# Patient Record
Sex: Male | Born: 1965
Health system: Southern US, Community
[De-identification: ages and names within clinical notes are randomized; demographics above are authoritative.]

## PROBLEM LIST (undated history)

## (undated) DIAGNOSIS — F319 Bipolar disorder, unspecified: Secondary | ICD-10-CM

## (undated) DIAGNOSIS — Z87442 Personal history of urinary calculi: Secondary | ICD-10-CM

## (undated) DIAGNOSIS — R413 Other amnesia: Secondary | ICD-10-CM

## (undated) DIAGNOSIS — B9620 Unspecified Escherichia coli [E. coli] as the cause of diseases classified elsewhere: Secondary | ICD-10-CM

## (undated) DIAGNOSIS — M75121 Complete rotator cuff tear or rupture of right shoulder, not specified as traumatic: Secondary | ICD-10-CM

## (undated) DIAGNOSIS — G5600 Carpal tunnel syndrome, unspecified upper limb: Secondary | ICD-10-CM

## (undated) DIAGNOSIS — M1990 Unspecified osteoarthritis, unspecified site: Secondary | ICD-10-CM

## (undated) DIAGNOSIS — R5381 Other malaise: Secondary | ICD-10-CM

## (undated) DIAGNOSIS — S069XAA Unspecified intracranial injury with loss of consciousness status unknown, initial encounter: Secondary | ICD-10-CM

## (undated) DIAGNOSIS — M545 Low back pain: Secondary | ICD-10-CM

## (undated) DIAGNOSIS — J189 Pneumonia, unspecified organism: Secondary | ICD-10-CM

## (undated) DIAGNOSIS — R4020 Unspecified coma: Secondary | ICD-10-CM

## (undated) DIAGNOSIS — R5383 Other fatigue: Secondary | ICD-10-CM

## (undated) DIAGNOSIS — F519 Sleep disorder not due to a substance or known physiological condition, unspecified: Secondary | ICD-10-CM

## (undated) DIAGNOSIS — R011 Cardiac murmur, unspecified: Secondary | ICD-10-CM

## (undated) DIAGNOSIS — M86669 Other chronic osteomyelitis, unspecified tibia and fibula: Secondary | ICD-10-CM

## (undated) DIAGNOSIS — N390 Urinary tract infection, site not specified: Secondary | ICD-10-CM

## (undated) DIAGNOSIS — M7580 Other shoulder lesions, unspecified shoulder: Secondary | ICD-10-CM

## (undated) DIAGNOSIS — E291 Testicular hypofunction: Secondary | ICD-10-CM

## (undated) DIAGNOSIS — M869 Osteomyelitis, unspecified: Secondary | ICD-10-CM

## (undated) DIAGNOSIS — K5289 Other specified noninfective gastroenteritis and colitis: Secondary | ICD-10-CM

## (undated) DIAGNOSIS — E785 Hyperlipidemia, unspecified: Secondary | ICD-10-CM

## (undated) DIAGNOSIS — S069X9A Unspecified intracranial injury with loss of consciousness of unspecified duration, initial encounter: Secondary | ICD-10-CM

## (undated) DIAGNOSIS — I10 Essential (primary) hypertension: Secondary | ICD-10-CM

## (undated) DIAGNOSIS — Z550 Illiteracy and low-level literacy: Secondary | ICD-10-CM

## (undated) DIAGNOSIS — K219 Gastro-esophageal reflux disease without esophagitis: Secondary | ICD-10-CM

## (undated) DIAGNOSIS — F1021 Alcohol dependence, in remission: Secondary | ICD-10-CM

## (undated) DIAGNOSIS — M25469 Effusion, unspecified knee: Secondary | ICD-10-CM

## (undated) DIAGNOSIS — N529 Male erectile dysfunction, unspecified: Secondary | ICD-10-CM

## (undated) DIAGNOSIS — K589 Irritable bowel syndrome without diarrhea: Secondary | ICD-10-CM

## (undated) HISTORY — DX: Bipolar disorder, unspecified: F31.9

## (undated) HISTORY — DX: Urinary tract infection, site not specified: N39.0

## (undated) HISTORY — DX: Unspecified coma: R40.20

## (undated) HISTORY — PX: OTHER SURGICAL HISTORY: SHX169

## (undated) HISTORY — DX: Unspecified intracranial injury with loss of consciousness status unknown, initial encounter: S06.9XAA

## (undated) HISTORY — PX: HAND SURGERY: SHX662

## (undated) HISTORY — DX: Carpal tunnel syndrome, unspecified upper limb: G56.00

## (undated) HISTORY — DX: Other specified noninfective gastroenteritis and colitis: K52.89

## (undated) HISTORY — DX: Personal history of urinary calculi: Z87.442

## (undated) HISTORY — DX: Alcohol dependence, in remission: F10.21

## (undated) HISTORY — DX: Low back pain: M54.5

## (undated) HISTORY — DX: Other amnesia: R41.3

## (undated) HISTORY — DX: Illiteracy and low-level literacy: Z55.0

## (undated) HISTORY — DX: Testicular hypofunction: E29.1

## (undated) HISTORY — DX: Effusion, unspecified knee: M25.469

## (undated) HISTORY — DX: Irritable bowel syndrome without diarrhea: K58.9

## (undated) HISTORY — DX: Sleep disorder not due to a substance or known physiological condition, unspecified: F51.9

## (undated) HISTORY — DX: Male erectile dysfunction, unspecified: N52.9

## (undated) HISTORY — DX: Pneumonia, unspecified organism: J18.9

## (undated) HISTORY — DX: Unspecified Escherichia coli (E. coli) as the cause of diseases classified elsewhere: B96.20

## (undated) HISTORY — DX: Unspecified Escherichia coli (E. coli) as the cause of diseases classified elsewhere: N39.0

## (undated) HISTORY — DX: Unspecified intracranial injury with loss of consciousness of unspecified duration, initial encounter: S06.9X9A

## (undated) HISTORY — DX: Osteomyelitis, unspecified: M86.9

## (undated) HISTORY — DX: Other chronic osteomyelitis, unspecified tibia and fibula: M86.669

## (undated) HISTORY — DX: Unspecified osteoarthritis, unspecified site: M19.90

## (undated) HISTORY — DX: Hyperlipidemia, unspecified: E78.5

## (undated) HISTORY — DX: Essential (primary) hypertension: I10

## (undated) HISTORY — DX: Other malaise: R53.81

## (undated) HISTORY — DX: Other fatigue: R53.83

## (undated) HISTORY — DX: Gastro-esophageal reflux disease without esophagitis: K21.9

## (undated) HISTORY — PX: LEG AMPUTATION: SHX1105

---

## 1998-10-19 ENCOUNTER — Emergency Department (HOSPITAL_COMMUNITY): Admission: EM | Admit: 1998-10-19 | Discharge: 1998-10-19 | Payer: Self-pay | Admitting: Emergency Medicine

## 1998-10-19 ENCOUNTER — Encounter: Payer: Self-pay | Admitting: Emergency Medicine

## 1998-10-27 ENCOUNTER — Ambulatory Visit (HOSPITAL_COMMUNITY): Admission: RE | Admit: 1998-10-27 | Discharge: 1998-10-27 | Payer: Self-pay | Admitting: Family Medicine

## 1998-10-27 ENCOUNTER — Encounter: Payer: Self-pay | Admitting: Family Medicine

## 1999-03-25 ENCOUNTER — Encounter: Payer: Self-pay | Admitting: Emergency Medicine

## 1999-03-25 ENCOUNTER — Emergency Department (HOSPITAL_COMMUNITY): Admission: EM | Admit: 1999-03-25 | Discharge: 1999-03-25 | Payer: Self-pay | Admitting: Emergency Medicine

## 1999-03-31 ENCOUNTER — Encounter: Payer: Self-pay | Admitting: Urology

## 1999-03-31 ENCOUNTER — Ambulatory Visit (HOSPITAL_COMMUNITY): Admission: RE | Admit: 1999-03-31 | Discharge: 1999-03-31 | Payer: Self-pay | Admitting: Urology

## 1999-04-12 ENCOUNTER — Encounter: Payer: Self-pay | Admitting: Urology

## 1999-04-12 ENCOUNTER — Ambulatory Visit (HOSPITAL_COMMUNITY): Admission: RE | Admit: 1999-04-12 | Discharge: 1999-04-12 | Payer: Self-pay | Admitting: Urology

## 1999-04-15 ENCOUNTER — Ambulatory Visit (HOSPITAL_COMMUNITY): Admission: RE | Admit: 1999-04-15 | Discharge: 1999-04-15 | Payer: Self-pay | Admitting: Urology

## 1999-04-15 ENCOUNTER — Encounter: Payer: Self-pay | Admitting: Urology

## 1999-05-24 ENCOUNTER — Encounter: Payer: Self-pay | Admitting: Urology

## 1999-05-24 ENCOUNTER — Ambulatory Visit (HOSPITAL_COMMUNITY): Admission: RE | Admit: 1999-05-24 | Discharge: 1999-05-24 | Payer: Self-pay | Admitting: Urology

## 2000-05-09 ENCOUNTER — Encounter: Payer: Self-pay | Admitting: Endocrinology

## 2000-05-09 ENCOUNTER — Ambulatory Visit (HOSPITAL_COMMUNITY): Admission: RE | Admit: 2000-05-09 | Discharge: 2000-05-09 | Payer: Self-pay | Admitting: Endocrinology

## 2000-06-08 ENCOUNTER — Encounter: Payer: Self-pay | Admitting: Urology

## 2000-06-08 ENCOUNTER — Encounter: Admission: RE | Admit: 2000-06-08 | Discharge: 2000-06-08 | Payer: Self-pay | Admitting: Urology

## 2000-06-11 ENCOUNTER — Emergency Department (HOSPITAL_COMMUNITY): Admission: EM | Admit: 2000-06-11 | Discharge: 2000-06-11 | Payer: Self-pay | Admitting: Emergency Medicine

## 2000-06-11 ENCOUNTER — Encounter: Payer: Self-pay | Admitting: Emergency Medicine

## 2000-12-03 ENCOUNTER — Encounter: Payer: Self-pay | Admitting: Internal Medicine

## 2000-12-03 ENCOUNTER — Emergency Department (HOSPITAL_COMMUNITY): Admission: EM | Admit: 2000-12-03 | Discharge: 2000-12-03 | Payer: Self-pay | Admitting: Internal Medicine

## 2003-10-25 HISTORY — PX: OTHER SURGICAL HISTORY: SHX169

## 2004-09-13 ENCOUNTER — Ambulatory Visit: Payer: Self-pay | Admitting: Endocrinology

## 2004-09-20 ENCOUNTER — Ambulatory Visit: Payer: Self-pay | Admitting: Internal Medicine

## 2004-11-29 ENCOUNTER — Ambulatory Visit: Payer: Self-pay | Admitting: Endocrinology

## 2004-11-30 ENCOUNTER — Ambulatory Visit: Payer: Self-pay

## 2005-01-06 ENCOUNTER — Encounter: Admission: RE | Admit: 2005-01-06 | Discharge: 2005-01-06 | Payer: Self-pay

## 2005-02-16 ENCOUNTER — Ambulatory Visit: Payer: Self-pay | Admitting: Infectious Diseases

## 2005-03-31 ENCOUNTER — Ambulatory Visit: Payer: Self-pay | Admitting: Infectious Diseases

## 2005-10-12 ENCOUNTER — Ambulatory Visit: Payer: Self-pay | Admitting: Internal Medicine

## 2005-10-18 ENCOUNTER — Encounter: Admission: RE | Admit: 2005-10-18 | Discharge: 2005-10-18 | Payer: Self-pay | Admitting: Internal Medicine

## 2005-12-27 ENCOUNTER — Emergency Department (HOSPITAL_COMMUNITY): Admission: EM | Admit: 2005-12-27 | Discharge: 2005-12-28 | Payer: Self-pay | Admitting: Emergency Medicine

## 2006-01-06 ENCOUNTER — Ambulatory Visit: Payer: Self-pay | Admitting: Endocrinology

## 2006-07-21 ENCOUNTER — Ambulatory Visit: Payer: Self-pay | Admitting: Endocrinology

## 2006-08-04 ENCOUNTER — Ambulatory Visit: Payer: Self-pay | Admitting: Endocrinology

## 2006-08-18 ENCOUNTER — Ambulatory Visit: Payer: Self-pay | Admitting: Endocrinology

## 2006-09-21 ENCOUNTER — Ambulatory Visit: Payer: Self-pay | Admitting: Endocrinology

## 2006-09-28 ENCOUNTER — Encounter: Payer: Self-pay | Admitting: Endocrinology

## 2006-09-28 ENCOUNTER — Ambulatory Visit: Payer: Self-pay

## 2006-10-19 ENCOUNTER — Ambulatory Visit: Payer: Self-pay | Admitting: Gastroenterology

## 2007-09-03 ENCOUNTER — Encounter: Payer: Self-pay | Admitting: Endocrinology

## 2007-10-04 ENCOUNTER — Ambulatory Visit: Payer: Self-pay | Admitting: Internal Medicine

## 2007-10-04 DIAGNOSIS — M79609 Pain in unspecified limb: Secondary | ICD-10-CM | POA: Insufficient documentation

## 2007-10-04 DIAGNOSIS — E785 Hyperlipidemia, unspecified: Secondary | ICD-10-CM

## 2007-10-04 DIAGNOSIS — K589 Irritable bowel syndrome without diarrhea: Secondary | ICD-10-CM

## 2007-10-04 DIAGNOSIS — F519 Sleep disorder not due to a substance or known physiological condition, unspecified: Secondary | ICD-10-CM | POA: Insufficient documentation

## 2007-10-04 DIAGNOSIS — Z87442 Personal history of urinary calculi: Secondary | ICD-10-CM

## 2007-10-04 DIAGNOSIS — R5381 Other malaise: Secondary | ICD-10-CM

## 2007-10-04 DIAGNOSIS — K219 Gastro-esophageal reflux disease without esophagitis: Secondary | ICD-10-CM | POA: Insufficient documentation

## 2007-10-04 DIAGNOSIS — I10 Essential (primary) hypertension: Secondary | ICD-10-CM

## 2007-10-04 DIAGNOSIS — F1021 Alcohol dependence, in remission: Secondary | ICD-10-CM

## 2007-10-04 DIAGNOSIS — R5383 Other fatigue: Secondary | ICD-10-CM

## 2007-10-04 DIAGNOSIS — J309 Allergic rhinitis, unspecified: Secondary | ICD-10-CM | POA: Insufficient documentation

## 2007-10-04 DIAGNOSIS — F319 Bipolar disorder, unspecified: Secondary | ICD-10-CM | POA: Insufficient documentation

## 2007-10-04 HISTORY — DX: Essential (primary) hypertension: I10

## 2007-10-04 HISTORY — DX: Other fatigue: R53.83

## 2007-10-04 HISTORY — DX: Sleep disorder not due to a substance or known physiological condition, unspecified: F51.9

## 2007-10-04 HISTORY — DX: Alcohol dependence, in remission: F10.21

## 2007-10-04 HISTORY — DX: Gastro-esophageal reflux disease without esophagitis: K21.9

## 2007-10-04 HISTORY — DX: Personal history of urinary calculi: Z87.442

## 2007-10-04 HISTORY — DX: Irritable bowel syndrome, unspecified: K58.9

## 2007-10-04 HISTORY — DX: Hyperlipidemia, unspecified: E78.5

## 2007-10-04 HISTORY — DX: Other fatigue: R53.81

## 2007-10-04 HISTORY — DX: Bipolar disorder, unspecified: F31.9

## 2007-10-07 ENCOUNTER — Encounter: Payer: Self-pay | Admitting: Internal Medicine

## 2007-10-07 DIAGNOSIS — R7303 Prediabetes: Secondary | ICD-10-CM | POA: Insufficient documentation

## 2007-10-07 LAB — CONVERTED CEMR LAB
ALT: 37 units/L (ref 0–53)
Alkaline Phosphatase: 113 units/L (ref 39–117)
BUN: 13 mg/dL (ref 6–23)
Basophils Relative: 0.5 % (ref 0.0–1.0)
Calcium: 10.2 mg/dL (ref 8.4–10.5)
Cholesterol: 171 mg/dL (ref 0–200)
Eosinophils Absolute: 0.1 10*3/uL (ref 0.0–0.6)
GFR calc Af Amer: 139 mL/min
GFR calc non Af Amer: 115 mL/min
HDL: 30.4 mg/dL — ABNORMAL LOW (ref >39.0)
Lymphocytes Relative: 28.9 % (ref 12.0–46.0)
MCV: 82.3 fL (ref 78.0–100.0)
Monocytes Relative: 7.2 % (ref 3.0–11.0)
Neutro Abs: 6.4 10*3/uL (ref 1.4–7.7)
Platelets: 394 10*3/uL (ref 150–400)
RBC: 5.23 M/uL (ref 4.22–5.81)
TSH: 1.18 microintl units/mL (ref 0.35–5.50)
Triglycerides: 283 mg/dL (ref 0–149)
VLDL: 57 mg/dL — ABNORMAL HIGH (ref 0–40)

## 2007-12-17 ENCOUNTER — Ambulatory Visit: Payer: Self-pay | Admitting: Endocrinology

## 2007-12-17 DIAGNOSIS — K5289 Other specified noninfective gastroenteritis and colitis: Secondary | ICD-10-CM

## 2007-12-17 HISTORY — DX: Other specified noninfective gastroenteritis and colitis: K52.89

## 2008-01-02 ENCOUNTER — Ambulatory Visit: Payer: Self-pay | Admitting: Endocrinology

## 2008-01-02 DIAGNOSIS — R413 Other amnesia: Secondary | ICD-10-CM | POA: Insufficient documentation

## 2008-01-03 LAB — CONVERTED CEMR LAB
Folate: 9.5 ng/mL
Vitamin B-12: 314 pg/mL (ref 211–911)

## 2008-01-04 ENCOUNTER — Telehealth (INDEPENDENT_AMBULATORY_CARE_PROVIDER_SITE_OTHER): Payer: Self-pay | Admitting: *Deleted

## 2008-01-18 ENCOUNTER — Encounter: Payer: Self-pay | Admitting: Endocrinology

## 2008-01-31 ENCOUNTER — Encounter
Admission: RE | Admit: 2008-01-31 | Discharge: 2008-04-30 | Payer: Self-pay | Admitting: Physical Medicine and Rehabilitation

## 2008-02-06 ENCOUNTER — Encounter: Payer: Self-pay | Admitting: Endocrinology

## 2008-02-06 ENCOUNTER — Ambulatory Visit: Payer: Self-pay | Admitting: Physical Medicine and Rehabilitation

## 2008-02-06 ENCOUNTER — Ambulatory Visit (HOSPITAL_COMMUNITY)
Admission: RE | Admit: 2008-02-06 | Discharge: 2008-02-06 | Payer: Self-pay | Admitting: Physical Medicine and Rehabilitation

## 2008-02-11 ENCOUNTER — Telehealth: Payer: Self-pay | Admitting: Endocrinology

## 2008-02-14 ENCOUNTER — Ambulatory Visit: Payer: Self-pay | Admitting: Endocrinology

## 2008-03-05 ENCOUNTER — Ambulatory Visit: Payer: Self-pay | Admitting: Physical Medicine and Rehabilitation

## 2008-03-13 ENCOUNTER — Telehealth (INDEPENDENT_AMBULATORY_CARE_PROVIDER_SITE_OTHER): Payer: Self-pay | Admitting: *Deleted

## 2008-03-13 DIAGNOSIS — M86669 Other chronic osteomyelitis, unspecified tibia and fibula: Secondary | ICD-10-CM | POA: Insufficient documentation

## 2008-03-13 HISTORY — DX: Other chronic osteomyelitis, unspecified tibia and fibula: M86.669

## 2008-03-21 ENCOUNTER — Encounter
Admission: RE | Admit: 2008-03-21 | Discharge: 2008-06-19 | Payer: Self-pay | Admitting: Physical Medicine and Rehabilitation

## 2008-03-24 ENCOUNTER — Ambulatory Visit: Payer: Self-pay | Admitting: Physical Medicine and Rehabilitation

## 2008-03-31 ENCOUNTER — Encounter: Admission: RE | Admit: 2008-03-31 | Discharge: 2008-04-15 | Payer: Self-pay | Admitting: *Deleted

## 2008-04-01 ENCOUNTER — Ambulatory Visit: Payer: Self-pay | Admitting: Psychiatry

## 2008-05-16 ENCOUNTER — Encounter
Admission: RE | Admit: 2008-05-16 | Discharge: 2008-05-19 | Payer: Self-pay | Admitting: Physical Medicine and Rehabilitation

## 2008-05-19 ENCOUNTER — Ambulatory Visit: Payer: Self-pay | Admitting: Physical Medicine and Rehabilitation

## 2008-05-19 ENCOUNTER — Telehealth (INDEPENDENT_AMBULATORY_CARE_PROVIDER_SITE_OTHER): Payer: Self-pay | Admitting: *Deleted

## 2008-05-20 ENCOUNTER — Ambulatory Visit: Payer: Self-pay | Admitting: Internal Medicine

## 2008-05-20 DIAGNOSIS — L989 Disorder of the skin and subcutaneous tissue, unspecified: Secondary | ICD-10-CM | POA: Insufficient documentation

## 2008-05-26 ENCOUNTER — Telehealth (INDEPENDENT_AMBULATORY_CARE_PROVIDER_SITE_OTHER): Payer: Self-pay | Admitting: *Deleted

## 2008-05-27 ENCOUNTER — Telehealth (INDEPENDENT_AMBULATORY_CARE_PROVIDER_SITE_OTHER): Payer: Self-pay | Admitting: *Deleted

## 2008-05-27 ENCOUNTER — Ambulatory Visit: Payer: Self-pay | Admitting: Endocrinology

## 2008-05-27 DIAGNOSIS — N529 Male erectile dysfunction, unspecified: Secondary | ICD-10-CM

## 2008-05-27 DIAGNOSIS — E291 Testicular hypofunction: Secondary | ICD-10-CM | POA: Insufficient documentation

## 2008-05-27 HISTORY — DX: Testicular hypofunction: E29.1

## 2008-05-27 HISTORY — DX: Male erectile dysfunction, unspecified: N52.9

## 2008-05-27 LAB — CONVERTED CEMR LAB: Testosterone: 135.05 ng/dL — ABNORMAL LOW (ref 350.00–890)

## 2008-05-29 ENCOUNTER — Telehealth (INDEPENDENT_AMBULATORY_CARE_PROVIDER_SITE_OTHER): Payer: Self-pay | Admitting: *Deleted

## 2008-06-04 ENCOUNTER — Telehealth (INDEPENDENT_AMBULATORY_CARE_PROVIDER_SITE_OTHER): Payer: Self-pay | Admitting: *Deleted

## 2008-06-06 ENCOUNTER — Telehealth (INDEPENDENT_AMBULATORY_CARE_PROVIDER_SITE_OTHER): Payer: Self-pay | Admitting: *Deleted

## 2008-06-06 ENCOUNTER — Ambulatory Visit: Payer: Self-pay | Admitting: Endocrinology

## 2008-06-06 LAB — CONVERTED CEMR LAB: Prolactin: 6 ng/mL

## 2008-06-09 ENCOUNTER — Ambulatory Visit: Payer: Self-pay | Admitting: Internal Medicine

## 2008-06-09 DIAGNOSIS — M25469 Effusion, unspecified knee: Secondary | ICD-10-CM

## 2008-06-09 HISTORY — DX: Effusion, unspecified knee: M25.469

## 2008-08-07 ENCOUNTER — Ambulatory Visit: Payer: Self-pay | Admitting: Infectious Diseases

## 2008-09-12 ENCOUNTER — Ambulatory Visit: Payer: Self-pay | Admitting: Endocrinology

## 2008-09-12 DIAGNOSIS — H538 Other visual disturbances: Secondary | ICD-10-CM | POA: Insufficient documentation

## 2008-10-01 ENCOUNTER — Encounter (INDEPENDENT_AMBULATORY_CARE_PROVIDER_SITE_OTHER): Payer: Self-pay | Admitting: *Deleted

## 2008-10-29 ENCOUNTER — Encounter: Payer: Self-pay | Admitting: Endocrinology

## 2008-11-11 ENCOUNTER — Encounter: Admission: RE | Admit: 2008-11-11 | Discharge: 2008-11-11 | Payer: Self-pay | Admitting: Neurology

## 2008-11-21 ENCOUNTER — Encounter: Payer: Self-pay | Admitting: Endocrinology

## 2008-11-26 ENCOUNTER — Telehealth: Payer: Self-pay | Admitting: Endocrinology

## 2008-12-12 ENCOUNTER — Ambulatory Visit: Payer: Self-pay | Admitting: Endocrinology

## 2008-12-13 LAB — CONVERTED CEMR LAB
AST: 23 units/L (ref 0–37)
Alkaline Phosphatase: 83 units/L (ref 39–117)
BUN: 15 mg/dL (ref 6–23)
Basophils Absolute: 0.1 10*3/uL (ref 0.0–0.1)
Bilirubin, Direct: 0.1 mg/dL (ref 0.0–0.3)
Chloride: 102 meq/L (ref 96–112)
Eosinophils Absolute: 0.2 10*3/uL (ref 0.0–0.7)
Eosinophils Relative: 2.2 % (ref 0.0–5.0)
GFR calc non Af Amer: 133 mL/min
Hemoglobin, Urine: NEGATIVE
LDL Cholesterol: 103 mg/dL — ABNORMAL HIGH (ref 0–99)
MCV: 82.7 fL (ref 78.0–100.0)
Neutrophils Relative %: 40.3 % — ABNORMAL LOW (ref 43.0–77.0)
Platelets: 261 10*3/uL (ref 150–400)
Potassium: 3.9 meq/L (ref 3.5–5.1)
Total Bilirubin: 0.6 mg/dL (ref 0.3–1.2)
Total CHOL/HDL Ratio: 4.6
Urine Glucose: NEGATIVE mg/dL
Urobilinogen, UA: 0.2 (ref 0.0–1.0)
VLDL: 30 mg/dL (ref 0–40)
WBC: 8.4 10*3/uL (ref 4.5–10.5)

## 2008-12-16 ENCOUNTER — Ambulatory Visit: Payer: Self-pay | Admitting: Internal Medicine

## 2008-12-23 ENCOUNTER — Telehealth (INDEPENDENT_AMBULATORY_CARE_PROVIDER_SITE_OTHER): Payer: Self-pay | Admitting: *Deleted

## 2009-03-11 ENCOUNTER — Encounter: Payer: Self-pay | Admitting: Endocrinology

## 2009-09-10 ENCOUNTER — Telehealth (INDEPENDENT_AMBULATORY_CARE_PROVIDER_SITE_OTHER): Payer: Self-pay | Admitting: *Deleted

## 2009-10-29 ENCOUNTER — Telehealth: Payer: Self-pay | Admitting: Endocrinology

## 2009-10-29 ENCOUNTER — Ambulatory Visit: Payer: Self-pay | Admitting: Endocrinology

## 2009-10-29 DIAGNOSIS — M545 Low back pain, unspecified: Secondary | ICD-10-CM | POA: Insufficient documentation

## 2009-10-29 HISTORY — DX: Low back pain, unspecified: M54.50

## 2009-10-29 LAB — CONVERTED CEMR LAB
AST: 33 units/L (ref 0–37)
Albumin: 3.9 g/dL (ref 3.5–5.2)
BUN: 10 mg/dL (ref 6–23)
Basophils Absolute: 0.1 10*3/uL (ref 0.0–0.1)
Bilirubin Urine: NEGATIVE
CO2: 31 meq/L (ref 19–32)
Calcium: 9.6 mg/dL (ref 8.4–10.5)
Cholesterol: 188 mg/dL (ref 0–200)
Creatinine,U: 171.7 mg/dL
Eosinophils Absolute: 0 10*3/uL (ref 0.0–0.7)
GFR calc non Af Amer: 113.22 mL/min (ref >60)
Glucose, Bld: 87 mg/dL (ref 70–99)
Glucose, Urine, Semiquant: NEGATIVE
HDL: 39.3 mg/dL (ref >39.00)
Hemoglobin: 14.5 g/dL (ref 13.0–17.0)
Hgb A1c MFr Bld: 6.7 % — ABNORMAL HIGH (ref 4.6–6.5)
Lymphocytes Relative: 34.3 % (ref 12.0–46.0)
Microalb Creat Ratio: 16.3 mg/g (ref 0.0–30.0)
Microalb, Ur: 2.8 mg/dL — ABNORMAL HIGH (ref 0.0–1.9)
Monocytes Relative: 8.5 % (ref 3.0–12.0)
Neutro Abs: 4.3 10*3/uL (ref 1.4–7.7)
Neutrophils Relative %: 55.8 % (ref 43.0–77.0)
RBC: 5.33 M/uL (ref 4.22–5.81)
RDW: 13 % (ref 11.5–14.6)
TSH: 2.12 microintl units/mL (ref 0.35–5.50)
Total Protein: 8.2 g/dL (ref 6.0–8.3)
VLDL: 35.8 mg/dL (ref 0.0–40.0)
Valproic Acid Lvl: 67.1 ug/mL (ref 50.0–100.0)
pH: 5

## 2009-11-20 ENCOUNTER — Ambulatory Visit: Payer: Self-pay | Admitting: Endocrinology
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
## 2010-11-23 NOTE — Progress Notes (Signed)
Summary: Pharmacy clarification  Phone Note From Pharmacy   Caller: [facility] Summary of Call: pharmacy called to inform MD that RX from today's visit has no such dosage. please clarify. pharmacy request new e-script. Initial call taken by: Margaret Pyle, CMA,  October 29, 2009 3:24 PM  Follow-up for Phone Call        i resent from generic list Follow-up by: Minus Breeding MD,  October 29, 2009 3:41 PM
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
## 2010-12-02 ENCOUNTER — Other Ambulatory Visit: Payer: Self-pay | Admitting: Anesthesiology

## 2010-12-02 DIAGNOSIS — M545 Low back pain, unspecified: Secondary | ICD-10-CM

## 2010-12-08 ENCOUNTER — Ambulatory Visit
Admission: RE | Admit: 2010-12-08 | Discharge: 2010-12-08 | Disposition: A | Discharge status: Home / Self Care | Payer: Medicare HMO | Source: Ambulatory Visit | Attending: Anesthesiology | Admitting: Anesthesiology

## 2010-12-08 DIAGNOSIS — M545 Low back pain, unspecified: Secondary | ICD-10-CM

## 2011-02-01 ENCOUNTER — Other Ambulatory Visit: Payer: Self-pay | Admitting: Endocrinology

## 2011-02-02 ENCOUNTER — Other Ambulatory Visit: Payer: Self-pay | Admitting: Endocrinology

## 2011-02-24 ENCOUNTER — Ambulatory Visit (INDEPENDENT_AMBULATORY_CARE_PROVIDER_SITE_OTHER): Payer: Medicare HMO | Admitting: Endocrinology

## 2011-02-24 ENCOUNTER — Other Ambulatory Visit (INDEPENDENT_AMBULATORY_CARE_PROVIDER_SITE_OTHER): Payer: Medicare HMO | Admitting: Endocrinology

## 2011-02-24 ENCOUNTER — Other Ambulatory Visit (INDEPENDENT_AMBULATORY_CARE_PROVIDER_SITE_OTHER): Payer: Medicare HMO

## 2011-02-24 ENCOUNTER — Encounter: Payer: Self-pay | Admitting: Endocrinology

## 2011-02-24 DIAGNOSIS — Z79899 Other long term (current) drug therapy: Secondary | ICD-10-CM

## 2011-02-24 DIAGNOSIS — I10 Essential (primary) hypertension: Secondary | ICD-10-CM

## 2011-02-24 DIAGNOSIS — E785 Hyperlipidemia, unspecified: Secondary | ICD-10-CM

## 2011-02-24 DIAGNOSIS — Z87442 Personal history of urinary calculi: Secondary | ICD-10-CM

## 2011-02-24 DIAGNOSIS — E291 Testicular hypofunction: Secondary | ICD-10-CM

## 2011-02-24 DIAGNOSIS — E119 Type 2 diabetes mellitus without complications: Secondary | ICD-10-CM

## 2011-02-24 LAB — CBC WITH DIFFERENTIAL/PLATELET
Basophils Relative: 0.5 % (ref 0.0–3.0)
Eosinophils Absolute: 0.1 10*3/uL (ref 0.0–0.7)
Eosinophils Relative: 0.7 % (ref 0.0–5.0)
HCT: 42.4 % (ref 39.0–52.0)
Lymphs Abs: 3.8 10*3/uL (ref 0.7–4.0)
MCHC: 34.4 g/dL (ref 30.0–36.0)
MCV: 83.6 fl (ref 78.0–100.0)
Monocytes Absolute: 0.9 10*3/uL (ref 0.1–1.0)
Neutrophils Relative %: 54.7 % (ref 43.0–77.0)
Platelets: 295 10*3/uL (ref 150.0–400.0)
WBC: 10.7 10*3/uL — ABNORMAL HIGH (ref 4.5–10.5)

## 2011-02-24 LAB — BASIC METABOLIC PANEL
BUN: 15 mg/dL (ref 6–23)
Chloride: 96 mEq/L (ref 96–112)
GFR: 109.33 mL/min (ref >60.00)
Potassium: 4.7 mEq/L (ref 3.5–5.1)
Sodium: 136 mEq/L (ref 135–145)

## 2011-02-24 LAB — LIPID PANEL
HDL: 42.2 mg/dL (ref >39.00)
Total CHOL/HDL Ratio: 4
Triglycerides: 328 mg/dL — ABNORMAL HIGH (ref 0.0–149.0)

## 2011-02-24 LAB — TESTOSTERONE: Testosterone: 66.49 ng/dL — ABNORMAL LOW (ref 350.00–890.00)

## 2011-02-24 LAB — URINALYSIS, ROUTINE W REFLEX MICROSCOPIC
Ketones, ur: 15
Specific Gravity, Urine: >=1.03 (ref 1.000–1.030)
Total Protein, Urine: NEGATIVE
Urine Glucose: NEGATIVE
pH: 6 (ref 5.0–8.0)

## 2011-02-24 LAB — URIC ACID: Uric Acid, Serum: 7 mg/dL (ref 4.0–7.8)

## 2011-02-24 LAB — HEPATIC FUNCTION PANEL
ALT: 39 U/L (ref 0–53)
AST: 44 U/L — ABNORMAL HIGH (ref 0–37)
Bilirubin, Direct: 0 mg/dL (ref 0.0–0.3)
Total Bilirubin: 0.4 mg/dL (ref 0.3–1.2)

## 2011-02-24 LAB — MICROALBUMIN / CREATININE URINE RATIO: Microalb, Ur: 5.2 mg/dL — ABNORMAL HIGH (ref 0.0–1.9)

## 2011-02-24 NOTE — Patient Instructions (Addendum)
blood tests are being ordered for you today.  please call [phone] to hear your test results.  You will be prompted to enter the 9-digit "MRN" number that appears at the top left of this page, followed by #.  Then you will hear the message. Please schedule a regular physical. (update: i left message on phone-tree:  We'll recheck lft upon ret.  i advised metformin.  Do you want refill of clomid?)
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
## 2011-02-25 LAB — LDL CHOLESTEROL, DIRECT: Direct LDL: 123.5 mg/dL

## 2011-03-04 ENCOUNTER — Other Ambulatory Visit: Payer: Self-pay | Admitting: Endocrinology
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
## 2011-03-27 ENCOUNTER — Other Ambulatory Visit: Payer: Self-pay | Admitting: Endocrinology

## 2011-06-01 ENCOUNTER — Other Ambulatory Visit: Payer: Self-pay | Admitting: Endocrinology

## 2011-09-09 ENCOUNTER — Ambulatory Visit: Payer: Medicare HMO | Admitting: Endocrinology

## 2011-09-09 ENCOUNTER — Encounter: Payer: Self-pay | Admitting: Endocrinology

## 2011-09-09 ENCOUNTER — Ambulatory Visit (INDEPENDENT_AMBULATORY_CARE_PROVIDER_SITE_OTHER): Payer: Medicare HMO | Admitting: Endocrinology

## 2011-09-09 DIAGNOSIS — E291 Testicular hypofunction: Secondary | ICD-10-CM

## 2011-09-09 MED ORDER — CLOMIPHENE CITRATE 50 MG PO TABS: ORAL_TABLET | ORAL | Status: DC

## 2011-09-09 MED ORDER — FLUCONAZOLE 150 MG PO TABS: 150.0000 mg | ORAL_TABLET | Freq: Every day | ORAL | Status: AC

## 2011-09-09 NOTE — Patient Instructions (Addendum)
Please schedule a regular physical in 1 month  i have sent a prescription to your pharmacy, for "comiphine" (pill to raise the testosterone).   When you come back, we can recheck the liver and blood sugar.  i have also sent a prescription to your pharmacy, for a pill to stop the penis symptoms.  You should not take the simvastatin while on this, and you should take only 1/2 as much diazepam for thee 3 days.
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
## 2011-09-20 ENCOUNTER — Telehealth: Payer: Self-pay

## 2011-09-20 NOTE — Telephone Encounter (Signed)
Pt informed of MD's advisement. 

## 2011-09-20 NOTE — Telephone Encounter (Signed)
Pt called stating Diflucan helped with pain and irritation to penis and "kidney". Pt is requesting MD advisement, okay to refill medications as sxs reoccur?

## 2011-09-20 NOTE — Telephone Encounter (Signed)
The rx has 2 refills

## 2011-09-21 ENCOUNTER — Other Ambulatory Visit: Payer: Self-pay | Admitting: Endocrinology

## 2011-09-24 ENCOUNTER — Other Ambulatory Visit: Payer: Self-pay | Admitting: Endocrinology

## 2011-10-11 ENCOUNTER — Other Ambulatory Visit: Payer: Medicare HMO

## 2011-10-11 ENCOUNTER — Ambulatory Visit (INDEPENDENT_AMBULATORY_CARE_PROVIDER_SITE_OTHER): Payer: Medicare HMO | Admitting: Endocrinology

## 2011-10-11 ENCOUNTER — Other Ambulatory Visit (INDEPENDENT_AMBULATORY_CARE_PROVIDER_SITE_OTHER): Payer: Medicare HMO

## 2011-10-11 ENCOUNTER — Encounter: Payer: Self-pay | Admitting: Endocrinology

## 2011-10-11 DIAGNOSIS — E119 Type 2 diabetes mellitus without complications: Secondary | ICD-10-CM

## 2011-10-11 DIAGNOSIS — F319 Bipolar disorder, unspecified: Secondary | ICD-10-CM

## 2011-10-11 DIAGNOSIS — E291 Testicular hypofunction: Secondary | ICD-10-CM

## 2011-10-11 DIAGNOSIS — K769 Liver disease, unspecified: Secondary | ICD-10-CM

## 2011-10-11 LAB — HEPATIC FUNCTION PANEL
Bilirubin, Direct: 0.1 mg/dL (ref 0.0–0.3)
Total Bilirubin: 0.3 mg/dL (ref 0.3–1.2)

## 2011-10-11 LAB — HEMOGLOBIN A1C: Hgb A1c MFr Bld: 8.5 % — ABNORMAL HIGH (ref 4.6–6.5)

## 2011-10-11 MED ORDER — METFORMIN HCL ER 500 MG PO TB24: ORAL_TABLET | ORAL | Status: DC

## 2011-10-11 NOTE — Patient Instructions (Addendum)
blood tests are being requested for you today.  please call [phone] to hear your test results.  You will be prompted to enter the 9-digit "MRN" number that appears at the top left of this page, followed by #.  Then you will hear the message.   Please return in 1 year. please consider these measures for your health:  minimize alcohol.  do not use tobacco products.  have a colonoscopy at least every 10 years from age 45.  keep firearms safely stored.  always use seat belts.  have working smoke alarms in your home.  see an eye doctor and dentist regularly.  never drive under the influence of alcohol or drugs (including prescription drugs).  those with fair skin should take precautions against the sun. (update: i left message on phone-tree:  i sent rx for metformin.  Please come back for a follow-up appointment in 3 months)
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
## 2011-10-12 LAB — HEPATITIS C ANTIBODY: HCV Ab: NEGATIVE

## 2011-10-12 LAB — HEPATITIS B SURFACE ANTIGEN: Hepatitis B Surface Ag: NEGATIVE

## 2011-12-05 ENCOUNTER — Telehealth: Payer: Self-pay

## 2011-12-05 NOTE — Telephone Encounter (Signed)
Pt states that he is willing to try to take 1/2 of what he is taking (2 tablets daily) and will callback if blurred vision persists.

## 2011-12-05 NOTE — Telephone Encounter (Signed)
ok 

## 2011-12-05 NOTE — Telephone Encounter (Signed)
Pt called stating that Metformin is causing blurred vision. Pt is requesting alternative medication, please advise.

## 2011-12-05 NOTE — Telephone Encounter (Signed)
This is a good and inexpensive medication.  Do you first want to try taking 1/2 as much?

## 2012-01-04 ENCOUNTER — Telehealth: Payer: Self-pay

## 2012-01-04 MED ORDER — LINAGLIPTIN 5 MG PO TABS: 5.0000 mg | ORAL_TABLET | Freq: Every day | ORAL | Status: DC

## 2012-01-04 NOTE — Telephone Encounter (Signed)
Pt called stating Metformin is causing blurred vision and twitching of upper extremities. Pt is requesting alternative medication, please advise.

## 2012-01-04 NOTE — Telephone Encounter (Signed)
Pt informed of new rx for DM.

## 2012-01-04 NOTE — Telephone Encounter (Signed)
i changed rx.  i have sent a prescription to your pharmacy.

## 2012-06-18 ENCOUNTER — Other Ambulatory Visit: Payer: Self-pay | Admitting: *Deleted

## 2012-06-18 MED ORDER — SIMVASTATIN 80 MG PO TABS: ORAL_TABLET | ORAL | Status: DC

## 2012-06-18 NOTE — Telephone Encounter (Signed)
R'cd fax from [facility] for refill of Simvastatin.  

## 2012-08-13 ENCOUNTER — Other Ambulatory Visit: Payer: Self-pay | Admitting: Endocrinology

## 2012-08-13 ENCOUNTER — Ambulatory Visit
Admission: RE | Admit: 2012-08-13 | Discharge: 2012-08-13 | Disposition: A | Discharge status: Home / Self Care | Payer: Medicare Other | Source: Ambulatory Visit | Attending: Endocrinology | Admitting: Endocrinology

## 2012-08-13 ENCOUNTER — Ambulatory Visit (INDEPENDENT_AMBULATORY_CARE_PROVIDER_SITE_OTHER): Payer: Medicare Other | Admitting: Endocrinology

## 2012-08-13 ENCOUNTER — Encounter: Payer: Self-pay | Admitting: Endocrinology

## 2012-08-13 VITALS — BP 122/76 | HR 74 | Temp 98.7°F | Resp 16 | Wt 223.5 lb

## 2012-08-13 DIAGNOSIS — R109 Unspecified abdominal pain: Secondary | ICD-10-CM

## 2012-08-13 MED ORDER — OMEPRAZOLE 40 MG PO CPDR: 40.0000 mg | DELAYED_RELEASE_CAPSULE | Freq: Every day | ORAL | Status: DC

## 2012-08-13 NOTE — Patient Instructions (Addendum)
Let's check an ultrasound of the area.  you will receive a phone call, about a day and time for an appointment.   Please come back for a regular physical in January.  I hope you feel better soon.  If you don't feel better soon, please call back.  Please call sooner if you get worse.   i have sent a prescription to your pharmacy, for the acid taste.
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
## 2012-08-30 ENCOUNTER — Other Ambulatory Visit: Payer: Self-pay | Admitting: *Deleted

## 2012-08-30 MED ORDER — LISINOPRIL-HYDROCHLOROTHIAZIDE 10-12.5 MG PO TABS: 1.0000 | ORAL_TABLET | Freq: Every day | ORAL | Status: DC

## 2012-08-30 NOTE — Telephone Encounter (Signed)
Medication refill request.

## 2012-09-24 ENCOUNTER — Encounter: Payer: Medicare HMO | Admitting: Endocrinology

## 2012-11-05 ENCOUNTER — Encounter: Payer: Self-pay | Admitting: Endocrinology

## 2012-11-05 ENCOUNTER — Ambulatory Visit (INDEPENDENT_AMBULATORY_CARE_PROVIDER_SITE_OTHER): Payer: Medicare Other | Admitting: Endocrinology

## 2012-11-05 VITALS — BP 124/80 | HR 71 | Wt 221.0 lb

## 2012-11-05 DIAGNOSIS — E119 Type 2 diabetes mellitus without complications: Secondary | ICD-10-CM

## 2012-11-05 DIAGNOSIS — I10 Essential (primary) hypertension: Secondary | ICD-10-CM

## 2012-11-05 DIAGNOSIS — E1059 Type 1 diabetes mellitus with other circulatory complications: Secondary | ICD-10-CM

## 2012-11-05 DIAGNOSIS — E291 Testicular hypofunction: Secondary | ICD-10-CM

## 2012-11-05 DIAGNOSIS — Z79899 Other long term (current) drug therapy: Secondary | ICD-10-CM

## 2012-11-05 DIAGNOSIS — E785 Hyperlipidemia, unspecified: Secondary | ICD-10-CM

## 2012-11-05 DIAGNOSIS — K769 Liver disease, unspecified: Secondary | ICD-10-CM

## 2012-11-05 LAB — URINALYSIS, ROUTINE W REFLEX MICROSCOPIC
Bilirubin Urine: NEGATIVE
Nitrite: NEGATIVE
Total Protein, Urine: NEGATIVE
pH: 6 (ref 5.0–8.0)

## 2012-11-05 LAB — CBC WITH DIFFERENTIAL/PLATELET
Eosinophils Relative: 0.4 % (ref 0.0–5.0)
HCT: 44.3 % (ref 39.0–52.0)
Hemoglobin: 14.7 g/dL (ref 13.0–17.0)
Lymphs Abs: 2 10*3/uL (ref 0.7–4.0)
Monocytes Relative: 6.3 % (ref 3.0–12.0)
Neutro Abs: 6 10*3/uL (ref 1.4–7.7)
RBC: 5.28 Mil/uL (ref 4.22–5.81)
WBC: 8.6 10*3/uL (ref 4.5–10.5)

## 2012-11-05 LAB — LIPID PANEL: Total CHOL/HDL Ratio: 4

## 2012-11-05 LAB — BASIC METABOLIC PANEL
BUN: 14 mg/dL (ref 6–23)
GFR: 110.01 mL/min (ref >60.00)
Glucose, Bld: 99 mg/dL (ref 70–99)
Potassium: 4.5 mEq/L (ref 3.5–5.1)

## 2012-11-05 LAB — HEPATIC FUNCTION PANEL
ALT: 25 U/L (ref 0–53)
AST: 22 U/L (ref 0–37)
Albumin: 4 g/dL (ref 3.5–5.2)
Total Bilirubin: 0.4 mg/dL (ref 0.3–1.2)

## 2012-11-05 LAB — TSH: TSH: 0.23 u[IU]/mL — ABNORMAL LOW (ref 0.35–5.50)

## 2012-11-05 NOTE — Patient Instructions (Addendum)
blood tests are being requested for you today.  We'll contact you with results. please consider these measures for your health:  minimize alcohol.  do not use tobacco products.  have a colonoscopy at least every 10 years from age 45.  keep firearms safely stored.  always use seat belts.  have working smoke alarms in your home.  see an eye doctor and dentist regularly.  never drive under the influence of alcohol or drugs (including prescription drugs).  those with fair skin should take precautions against the sun. Please come back for a follow-up appointment in 6 months.    
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
## 2012-11-28 ENCOUNTER — Other Ambulatory Visit: Payer: Self-pay

## 2012-11-29 ENCOUNTER — Other Ambulatory Visit: Payer: Self-pay | Admitting: Endocrinology

## 2012-12-08 ENCOUNTER — Other Ambulatory Visit: Payer: Self-pay

## 2012-12-13 NOTE — Addendum Note (Signed)
Addended by: Sharlyne Pacas on: 12/13/2012 01:14 PM   Modules accepted: Orders

## 2012-12-19 ENCOUNTER — Ambulatory Visit (INDEPENDENT_AMBULATORY_CARE_PROVIDER_SITE_OTHER): Payer: Medicare Other | Admitting: Endocrinology

## 2012-12-19 ENCOUNTER — Telehealth: Payer: Self-pay

## 2012-12-19 VITALS — BP 126/80 | HR 80

## 2012-12-19 DIAGNOSIS — J069 Acute upper respiratory infection, unspecified: Secondary | ICD-10-CM

## 2012-12-19 MED ORDER — CEFUROXIME AXETIL 250 MG PO TABS: 250.0000 mg | ORAL_TABLET | Freq: Two times a day (BID) | ORAL | Status: AC

## 2012-12-19 MED ORDER — BENZONATATE 200 MG PO CAPS: 200.0000 mg | ORAL_CAPSULE | Freq: Three times a day (TID) | ORAL | Status: DC | PRN

## 2012-12-19 MED ORDER — BENZONATATE 100 MG PO CAPS: 100.0000 mg | ORAL_CAPSULE | Freq: Three times a day (TID) | ORAL | Status: DC | PRN

## 2012-12-19 NOTE — Telephone Encounter (Signed)
Pt called ins will not pay for cough rx you prescribed, please change
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
## 2012-12-19 NOTE — Patient Instructions (Addendum)
i have sent 2 prescriptions to your pharmacy: for an antibiotic, and cough pill. I hope you feel better soon.  If you don't feel better by next week, please call back.  Please call sooner if you get worse.

## 2012-12-19 NOTE — Telephone Encounter (Signed)
It is a cheap generic prescription.  If 100 mg pill are cheaper at your pharmacy, i can change the rx.

## 2012-12-19 NOTE — Telephone Encounter (Signed)
Pt would like you to change the rx

## 2012-12-19 NOTE — Telephone Encounter (Signed)
i changed and sent rx 

## 2012-12-19 NOTE — Telephone Encounter (Signed)
Pt advised.

## 2012-12-21 ENCOUNTER — Telehealth: Payer: Self-pay | Admitting: *Deleted

## 2012-12-21 NOTE — Telephone Encounter (Signed)
PATIENT CALLED AS FYI THAT COUGH MEDICATION ORDERED FOR HIM IS NOT COVERED ON INSURANCE PER PHARMACY AND HAD TO USE OTC COUGH MEDICATION. PATIENT WAS UPSET ABOUT THIS BUT STATES IS FEELING BETTER AND COUGH IS GETTING BETTER WITH OTC MED.

## 2013-01-17 ENCOUNTER — Other Ambulatory Visit: Payer: Self-pay | Admitting: Endocrinology

## 2013-01-21 ENCOUNTER — Ambulatory Visit (INDEPENDENT_AMBULATORY_CARE_PROVIDER_SITE_OTHER): Payer: Medicare Other | Admitting: Endocrinology

## 2013-01-21 ENCOUNTER — Encounter: Payer: Self-pay | Admitting: Endocrinology

## 2013-01-21 ENCOUNTER — Telehealth: Payer: Self-pay | Admitting: Endocrinology

## 2013-01-21 VITALS — BP 126/74 | HR 78 | Wt 214.0 lb

## 2013-01-21 DIAGNOSIS — E119 Type 2 diabetes mellitus without complications: Secondary | ICD-10-CM

## 2013-01-21 DIAGNOSIS — E079 Disorder of thyroid, unspecified: Secondary | ICD-10-CM | POA: Insufficient documentation

## 2013-01-21 LAB — HEMOGLOBIN A1C: Hgb A1c MFr Bld: 5.7 % (ref 4.6–6.5)

## 2013-01-21 MED ORDER — DIPHENOXYLATE-ATROPINE 2.5-0.025 MG PO TABS: 1.0000 | ORAL_TABLET | Freq: Four times a day (QID) | ORAL | Status: DC | PRN

## 2013-01-21 NOTE — Telephone Encounter (Signed)
The patient called hoping to get something called in for stomach cramps.  He was offered and ov for today or tomorrow, but refused.

## 2013-01-21 NOTE — Telephone Encounter (Signed)
If you can't make it here, see urgent care closer to your home.

## 2013-01-21 NOTE — Telephone Encounter (Signed)
Pt's wife advised pt would have to be seen in order to get a rx, states an understanding
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
## 2013-01-21 NOTE — Telephone Encounter (Signed)
Pt called back stating he is having diarrhea and doesn't think he can make it to the office, he states he has had this since last Thursday, taking Immodium, and it is not helping, what to do?

## 2013-01-21 NOTE — Patient Instructions (Addendum)
i have sent a prescription to your pharmacy, to help the diarrhea. I hope you feel better soon.  If you don't feel better by next week, please call back.    Diarrhea Diarrhea is watery poop (stool). The most common cause of diarrhea is a germ. Other causes include:  Food poisoning.  A reaction to medicine. HOME CARE   Drink clear fluids. This can stop you from losing too much body fluid (dehydration).  Drink enough fluids to keep your pee (urine) clear or pale yellow.  Avoid solid foods and dairy products until you start to feel better. Then start eating bland foods, such as:  Bananas.  Rice.  Crackers.  Applesauce.  Dry toast.  Avoid spicy foods, caffeine, and alcohol.  Your doctor may give medicine to help with cramps and watery poop. Take this as told. Avoid these medicines if you have a fever or blood in your poop.  Take your medicine as told. Finish them even if you start to feel better. GET HELP RIGHT AWAY IF:   The watery poop lasts longer than 3 days.  You have a fever.  Your baby is older than 3 months with a rectal temperature of 100.5 F (38.1 C) or higher for more than 1 day.  There is blood in your poop.  You start to throw up (vomit).  You lose too much fluid. MAKE SURE YOU:   Understand these instructions.  Will watch your condition.  Will get help right away if you are not doing well or get worse. Document Released: 03/28/2008 Document Revised: 01/02/2012 Document Reviewed: 03/28/2008 [facility] Patient Information 2013 [place], [place].

## 2013-01-21 NOTE — Telephone Encounter (Signed)
i can't help without seeing patient, since i don't know what the cause is.

## 2013-02-15 ENCOUNTER — Other Ambulatory Visit: Payer: Self-pay | Admitting: Endocrinology

## 2013-02-15 ENCOUNTER — Other Ambulatory Visit: Payer: Self-pay | Admitting: *Deleted

## 2013-02-15 MED ORDER — SIMVASTATIN 80 MG PO TABS: 80.0000 mg | ORAL_TABLET | Freq: Every day | ORAL | Status: DC

## 2013-05-04 ENCOUNTER — Encounter: Payer: Self-pay | Admitting: *Deleted

## 2013-05-04 ENCOUNTER — Emergency Department (INDEPENDENT_AMBULATORY_CARE_PROVIDER_SITE_OTHER): Payer: Medicare Other

## 2013-05-04 ENCOUNTER — Emergency Department
Admission: EM | Admit: 2013-05-04 | Discharge: 2013-05-04 | Disposition: A | Discharge status: Home / Self Care | Payer: Medicare Other | Source: Home / Self Care

## 2013-05-04 DIAGNOSIS — S63502A Unspecified sprain of left wrist, initial encounter: Secondary | ICD-10-CM

## 2013-05-04 DIAGNOSIS — M25539 Pain in unspecified wrist: Secondary | ICD-10-CM

## 2013-05-04 DIAGNOSIS — S63509A Unspecified sprain of unspecified wrist, initial encounter: Secondary | ICD-10-CM
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
## 2013-05-28 ENCOUNTER — Ambulatory Visit: Payer: Medicare Other | Admitting: Endocrinology

## 2013-07-09 ENCOUNTER — Other Ambulatory Visit: Payer: Self-pay | Admitting: Endocrinology

## 2013-07-09 ENCOUNTER — Other Ambulatory Visit: Payer: Self-pay | Admitting: *Deleted

## 2013-07-09 MED ORDER — SIMVASTATIN 80 MG PO TABS: 80.0000 mg | ORAL_TABLET | Freq: Every day | ORAL | Status: DC

## 2013-07-28 ENCOUNTER — Other Ambulatory Visit: Payer: Self-pay | Admitting: Endocrinology

## 2013-08-29 ENCOUNTER — Other Ambulatory Visit: Payer: Self-pay

## 2013-10-18 ENCOUNTER — Other Ambulatory Visit: Payer: Self-pay | Admitting: Endocrinology

## 2013-10-30 ENCOUNTER — Other Ambulatory Visit: Payer: Self-pay | Admitting: Endocrinology

## 2013-11-25 ENCOUNTER — Other Ambulatory Visit: Payer: Self-pay | Admitting: Endocrinology

## 2013-12-30 ENCOUNTER — Ambulatory Visit (INDEPENDENT_AMBULATORY_CARE_PROVIDER_SITE_OTHER): Payer: Medicare Other | Admitting: Endocrinology

## 2013-12-30 ENCOUNTER — Encounter: Payer: Self-pay | Admitting: Endocrinology

## 2013-12-30 VITALS — BP 110/70 | HR 75 | Temp 98.3°F | Ht 68.0 in | Wt 230.0 lb

## 2013-12-30 DIAGNOSIS — E119 Type 2 diabetes mellitus without complications: Secondary | ICD-10-CM

## 2013-12-30 DIAGNOSIS — E079 Disorder of thyroid, unspecified: Secondary | ICD-10-CM

## 2013-12-30 DIAGNOSIS — K769 Liver disease, unspecified: Secondary | ICD-10-CM

## 2013-12-30 DIAGNOSIS — E785 Hyperlipidemia, unspecified: Secondary | ICD-10-CM

## 2013-12-30 DIAGNOSIS — G894 Chronic pain syndrome: Secondary | ICD-10-CM

## 2013-12-30 DIAGNOSIS — Z87442 Personal history of urinary calculi: Secondary | ICD-10-CM

## 2013-12-30 DIAGNOSIS — Z0000 Encounter for general adult medical examination without abnormal findings: Secondary | ICD-10-CM

## 2013-12-30 DIAGNOSIS — E291 Testicular hypofunction: Secondary | ICD-10-CM

## 2013-12-30 DIAGNOSIS — I10 Essential (primary) hypertension: Secondary | ICD-10-CM

## 2013-12-30 LAB — CBC WITH DIFFERENTIAL/PLATELET
BASOS ABS: 0 10*3/uL (ref 0.0–0.1)
Basophils Relative: 0.4 % (ref 0.0–3.0)
EOS PCT: 0.8 % (ref 0.0–5.0)
Eosinophils Absolute: 0.1 10*3/uL (ref 0.0–0.7)
HCT: 45.2 % (ref 39.0–52.0)
Hemoglobin: 15 g/dL (ref 13.0–17.0)
LYMPHS PCT: 26.5 % (ref 12.0–46.0)
Lymphs Abs: 2.2 10*3/uL (ref 0.7–4.0)
MCHC: 33.2 g/dL (ref 30.0–36.0)
MCV: 86.1 fl (ref 78.0–100.0)
MONOS PCT: 6.4 % (ref 3.0–12.0)
Monocytes Absolute: 0.5 10*3/uL (ref 0.1–1.0)
NEUTROS PCT: 65.9 % (ref 43.0–77.0)
Neutro Abs: 5.5 10*3/uL (ref 1.4–7.7)
PLATELETS: 328 10*3/uL (ref 150.0–400.0)
RBC: 5.26 Mil/uL (ref 4.22–5.81)
RDW: 13.5 % (ref 11.5–14.6)
WBC: 8.4 10*3/uL (ref 4.5–10.5)

## 2013-12-30 LAB — URINALYSIS, ROUTINE W REFLEX MICROSCOPIC
Bilirubin Urine: NEGATIVE
Hgb urine dipstick: NEGATIVE
Ketones, ur: NEGATIVE
LEUKOCYTES UA: NEGATIVE
NITRITE: NEGATIVE
SPECIFIC GRAVITY, URINE: 1.025 (ref 1.000–1.030)
Total Protein, Urine: NEGATIVE
UROBILINOGEN UA: 0.2 (ref 0.0–1.0)
Urine Glucose: NEGATIVE
pH: 6 (ref 5.0–8.0)

## 2013-12-30 LAB — MICROALBUMIN / CREATININE URINE RATIO
CREATININE, U: 204.2 mg/dL
Microalb Creat Ratio: 0.6 mg/g (ref 0.0–30.0)
Microalb, Ur: 1.3 mg/dL (ref 0.0–1.9)

## 2013-12-30 LAB — HEMOGLOBIN A1C: Hgb A1c MFr Bld: 5.7 % (ref 4.6–6.5)

## 2013-12-30 NOTE — Patient Instructions (Addendum)
blood tests are being requested for you today.  We'll contact you with results. please consider these measures for your health:  minimize alcohol.  do not use tobacco products.  have a colonoscopy at least every 10 years from age 48.  keep firearms safely stored.  always use seat belts.  have working smoke alarms in your home.  see an eye doctor and dentist regularly.  never drive under the influence of alcohol or drugs (including prescription drugs).  those with fair skin should take precautions against the sun. Please come back for a follow-up appointment in 6 months.    
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
## 2013-12-31 DIAGNOSIS — Z0001 Encounter for general adult medical examination with abnormal findings: Secondary | ICD-10-CM | POA: Insufficient documentation

## 2013-12-31 DIAGNOSIS — Z0000 Encounter for general adult medical examination without abnormal findings: Secondary | ICD-10-CM | POA: Insufficient documentation

## 2013-12-31 LAB — BASIC METABOLIC PANEL
BUN: 20 mg/dL (ref 6–23)
CALCIUM: 9.7 mg/dL (ref 8.4–10.5)
CO2: 28 mEq/L (ref 19–32)
Chloride: 104 mEq/L (ref 96–112)
Creatinine, Ser: 0.8 mg/dL (ref 0.4–1.5)
GFR: 104.93 mL/min (ref >60.00)
GLUCOSE: 94 mg/dL (ref 70–99)
POTASSIUM: 4.7 meq/L (ref 3.5–5.1)
SODIUM: 139 meq/L (ref 135–145)

## 2013-12-31 LAB — LIPID PANEL
Cholesterol: 198 mg/dL (ref 0–200)
HDL: 50.3 mg/dL (ref >39.00)
LDL CALC: 116 mg/dL — AB (ref 0–99)
Total CHOL/HDL Ratio: 4
Triglycerides: 161 mg/dL — ABNORMAL HIGH (ref 0.0–149.0)
VLDL: 32.2 mg/dL (ref 0.0–40.0)

## 2013-12-31 LAB — HEPATIC FUNCTION PANEL
ALBUMIN: 4.6 g/dL (ref 3.5–5.2)
ALT: 23 U/L (ref 0–53)
AST: 26 U/L (ref 0–37)
Alkaline Phosphatase: 89 U/L (ref 39–117)
Bilirubin, Direct: 0 mg/dL (ref 0.0–0.3)
Total Bilirubin: 0.5 mg/dL (ref 0.3–1.2)
Total Protein: 7.7 g/dL (ref 6.0–8.3)

## 2013-12-31 LAB — PTH, INTACT AND CALCIUM
CALCIUM: 9.6 mg/dL (ref 8.4–10.5)
PTH: 59.9 pg/mL (ref 14.0–72.0)

## 2013-12-31 LAB — TESTOSTERONE: TESTOSTERONE: 162.84 ng/dL — AB (ref 350.00–890.00)

## 2013-12-31 LAB — TSH: TSH: 1.11 u[IU]/mL (ref 0.35–5.50)

## 2014-01-08 ENCOUNTER — Other Ambulatory Visit: Payer: Self-pay | Admitting: Endocrinology

## 2014-01-16 ENCOUNTER — Other Ambulatory Visit: Payer: Self-pay | Admitting: Endocrinology

## 2014-01-16 ENCOUNTER — Ambulatory Visit (INDEPENDENT_AMBULATORY_CARE_PROVIDER_SITE_OTHER): Payer: Medicare Other | Admitting: Endocrinology

## 2014-01-16 ENCOUNTER — Telehealth: Payer: Self-pay | Admitting: Endocrinology

## 2014-01-16 ENCOUNTER — Encounter: Payer: Self-pay | Admitting: Endocrinology

## 2014-01-16 VITALS — BP 122/70 | HR 75 | Temp 97.6°F | Ht 68.0 in | Wt 230.0 lb

## 2014-01-16 DIAGNOSIS — R200 Anesthesia of skin: Secondary | ICD-10-CM | POA: Insufficient documentation

## 2014-01-16 DIAGNOSIS — R209 Unspecified disturbances of skin sensation: Secondary | ICD-10-CM

## 2014-01-16 NOTE — Telephone Encounter (Signed)
Release request sent to Medical Records.

## 2014-01-16 NOTE — Patient Instructions (Addendum)
i would be happy to request for you a test of the nerve endings of the hands. i'll request your paper record, to see if we have a copy of the old nerve-ending test.
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
## 2014-01-16 NOTE — Telephone Encounter (Signed)
paper chart please

## 2014-01-20 ENCOUNTER — Telehealth: Payer: Self-pay | Admitting: Endocrinology

## 2014-01-20 DIAGNOSIS — R200 Anesthesia of skin: Secondary | ICD-10-CM

## 2014-01-20 NOTE — Telephone Encounter (Signed)
Noted, pt called and was informed that we were still waiting on paper chart. Pt is concerned about waiting to have nerve test done. Please advise Thanks!

## 2014-01-20 NOTE — Telephone Encounter (Signed)
Noted, and pt informed,

## 2014-01-20 NOTE — Telephone Encounter (Signed)
Ok, i have ordered 

## 2014-01-20 NOTE — Telephone Encounter (Signed)
Pt calling stating that he was supposed to be set up for a nerve test on his hands. Please follow up

## 2014-01-22 ENCOUNTER — Telehealth: Payer: Self-pay | Admitting: Neurology

## 2014-01-22 ENCOUNTER — Telehealth: Payer: Self-pay

## 2014-01-22 NOTE — Telephone Encounter (Addendum)
Error

## 2014-01-22 NOTE — Telephone Encounter (Signed)
Patient wants someone to give him a call about what to expect in his upcoming appointment, states he thought it would be more than just an office visit, he thought that this was for a nerve test. Please call and advise patient. Patient is concerned about having to pay his copay for that visit and then having to pay another one if he needs to return for a test.

## 2014-01-23 ENCOUNTER — Other Ambulatory Visit: Payer: Self-pay | Admitting: Endocrinology

## 2014-01-23 NOTE — Telephone Encounter (Signed)
Spoke with patient and explained that the physician will determine what is needed during the assessment, and we would not have any idea what is needed before being seen.he verbalized understanding.

## 2014-01-28 ENCOUNTER — Encounter: Payer: Self-pay | Admitting: Neurology

## 2014-01-28 ENCOUNTER — Ambulatory Visit (INDEPENDENT_AMBULATORY_CARE_PROVIDER_SITE_OTHER): Payer: Medicare Other | Admitting: Neurology

## 2014-01-28 VITALS — BP 122/80 | HR 89 | Ht 69.0 in | Wt 231.0 lb

## 2014-01-28 DIAGNOSIS — R209 Unspecified disturbances of skin sensation: Secondary | ICD-10-CM

## 2014-01-28 DIAGNOSIS — R202 Paresthesia of skin: Secondary | ICD-10-CM

## 2014-01-28 MED ORDER — CYCLOBENZAPRINE HCL 7.5 MG PO TABS: 3.7500 mg | ORAL_TABLET | Freq: Three times a day (TID) | ORAL | Status: DC | PRN
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
## 2014-01-28 NOTE — Patient Instructions (Signed)
Overall you are doing fairly well but I do want to suggest a few things today:   Remember to drink plenty of fluid, eat healthy meals and do not skip any meals. Try to eat protein with a every meal and eat a healthy snack such as fruit or nuts in between meals. Try to keep a regular sleep-wake schedule and try to exercise daily, particularly in the form of walking, 20-30 minutes a day, if you can.   As far as your medications are concerned, I would like to suggest the following: 1)Please try Flexeril 3.75mg  (1/2 tab) as needed, up to 3 times a day for symptomatic relief  Please schedule an EMG/Nerve Conduction study to evaluate your hand numbness  Follow up once workup completed. Please call us with any interim questions, concerns, problems, updates or refill requests.   My clinical assistant and will answer any of your questions and relay your messages to me and also relay most of my messages to you.   Our phone number is [phone]. We also have an after hours call service for urgent matters and there is a physician on-call for urgent questions. For any emergencies you know to call 911 or go to the nearest emergency room

## 2014-02-04 ENCOUNTER — Other Ambulatory Visit: Payer: Self-pay | Admitting: Endocrinology

## 2014-02-18 ENCOUNTER — Telehealth: Payer: Self-pay | Admitting: Neurology

## 2014-02-18 NOTE — Telephone Encounter (Signed)
Pt called concerning the NCV/EMG test coming up, pt states the medicine has helped his hands but his neck is still hurting and just wants to know what these test coming up are going to help pt. Pt would like for Dr. Janann Colonel to call  back concerning this matter. Thanks

## 2014-02-19 ENCOUNTER — Other Ambulatory Visit: Payer: Self-pay | Admitting: Neurology

## 2014-02-19 MED ORDER — CYCLOBENZAPRINE HCL 7.5 MG PO TABS: 3.7500 mg | ORAL_TABLET | Freq: Three times a day (TID) | ORAL | Status: DC | PRN

## 2014-02-19 NOTE — Telephone Encounter (Signed)
Called patient and discussed upcoming test. As symptoms are improving and likely MSK in nature will cancel EMG/NCS at this time. Refilled his muscle relaxant.  Colletta [place], can you please cancel the upcmoEMG/NCS from Dr Joanna Hews schedule. Thanks.

## 2014-02-19 NOTE — Telephone Encounter (Signed)
Information was given to [place], [place].

## 2014-02-19 NOTE — Telephone Encounter (Signed)
Patient appointment was canceled.

## 2014-02-27 ENCOUNTER — Encounter: Payer: Medicare Other | Admitting: Radiology

## 2014-02-27 ENCOUNTER — Encounter: Payer: Medicare Other | Admitting: Neurology

## 2014-02-28 ENCOUNTER — Other Ambulatory Visit: Payer: Self-pay | Admitting: *Deleted

## 2014-02-28 ENCOUNTER — Other Ambulatory Visit: Payer: Self-pay | Admitting: Endocrinology

## 2014-02-28 MED ORDER — SIMVASTATIN 80 MG PO TABS: 80.0000 mg | ORAL_TABLET | Freq: Every day | ORAL | Status: DC

## 2014-03-14 ENCOUNTER — Other Ambulatory Visit: Payer: Self-pay | Admitting: Neurology

## 2014-03-14 ENCOUNTER — Telehealth: Payer: Self-pay | Admitting: Neurology

## 2014-03-14 MED ORDER — CYCLOBENZAPRINE HCL 7.5 MG PO TABS: 7.5000 mg | ORAL_TABLET | Freq: Three times a day (TID) | ORAL | Status: DC | PRN

## 2014-03-14 NOTE — Telephone Encounter (Signed)
Please let him know a prescription for 7.5mg  (1 whole tablet) was sent to his pharmacy. Thanks.

## 2014-03-14 NOTE — Telephone Encounter (Signed)
Patient still having problems with neck and shoulder down to back--needs refill called in for Cyclobenzaprine--needs a higher dose -[facility] in [facility] advise patient--thank you.

## 2014-03-14 NOTE — Telephone Encounter (Signed)
Patient indicates he is still having pain and would like a dose increase on Cyclobenzaprine.  Please advise.  Thank you.

## 2014-03-14 NOTE — Telephone Encounter (Signed)
I called the patient back.  He is aware Rx has been updated to one full tab daily and sent to the pharmacy.

## 2014-04-09 ENCOUNTER — Telehealth: Payer: Self-pay | Admitting: Neurology

## 2014-04-09 MED ORDER — CYCLOBENZAPRINE HCL 7.5 MG PO TABS: 7.5000 mg | ORAL_TABLET | Freq: Three times a day (TID) | ORAL | Status: DC | PRN

## 2014-04-09 NOTE — Telephone Encounter (Signed)
Rx has been sent  

## 2014-04-09 NOTE — Telephone Encounter (Signed)
Patient need refill of cyclobenzaprine (FEXMID) 7.5 MG tablet.

## 2014-04-30 ENCOUNTER — Telehealth: Payer: Self-pay | Admitting: Neurology

## 2014-04-30 NOTE — Telephone Encounter (Signed)
Patient is still having muscle spasms in his back--medication is not working well--patient states he needs a soon appointment--please call.

## 2014-05-01 ENCOUNTER — Other Ambulatory Visit: Payer: Self-pay | Admitting: Endocrinology

## 2014-05-01 NOTE — Telephone Encounter (Signed)
Offer follow up appt with Dr. Janann Colonel or NP in next 1-2 weeks. -VRP

## 2014-05-01 NOTE — Telephone Encounter (Signed)
Patient said that the Cyclobenzaprine-7.5mg  only helps with the spasms if he is sitting down and also his insurance will no pay for the medicine. Is there something else he can take?

## 2014-05-02 ENCOUNTER — Telehealth: Payer: Self-pay | Admitting: Diagnostic Neuroimaging

## 2014-05-02 NOTE — Telephone Encounter (Signed)
I do see several sedating medications on his list. I am reluctant to advise patient to take any other sedating medication. Please offer to patient that we can maybe move up his appointment with the nurse practitioner.

## 2014-05-02 NOTE — Telephone Encounter (Signed)
Called pt to inform him per Dr. Rexene Alberts that the doctor would like for the pt to come in sooner to see Hoyle Sauer, NP. Pt made an appt on 05/06/14 with Hoyle Sauer, NP. I advised the pt that if he has any other problems, questions or concerns to call the office. Pt verbalized understanding.

## 2014-05-02 NOTE — Telephone Encounter (Signed)
Patient stated his out of cyclobenzaprine (FEXMID) 7.5 MG tablet and next appointment is scheduled 7/20.  What should he do until then?  Please call and advise.  Thanks

## 2014-05-02 NOTE — Telephone Encounter (Signed)
The patient is inquiring about Cyclobenzaprine.  He has an appt on 07/20.  Per previous note, the patient says this med does not work for him unless he is sitting down, and states ins will not cover this drug.  Patient would like to know what he should do until he is seen.   I called ins who said the preferred drugs on his policy include: Tizanidine, Meloxicam, Diclofenac or Nabumetone.   Dr Janann Colonel is out of the office.  Forwarding request to [facility] for review.  Please advise.  Thank you.

## 2014-05-02 NOTE — Telephone Encounter (Signed)
Scheduled appt with NP(Martin), confirmed but patient requested a 3 week wait for appt.

## 2014-05-06 ENCOUNTER — Telehealth: Payer: Self-pay | Admitting: Neurology

## 2014-05-06 ENCOUNTER — Telehealth: Payer: Self-pay | Admitting: *Deleted

## 2014-05-06 ENCOUNTER — Encounter: Payer: Self-pay | Admitting: Nurse Practitioner

## 2014-05-06 ENCOUNTER — Ambulatory Visit (INDEPENDENT_AMBULATORY_CARE_PROVIDER_SITE_OTHER): Payer: Medicare Other | Admitting: Nurse Practitioner

## 2014-05-06 VITALS — BP 118/72 | HR 86 | Ht 69.0 in | Wt 228.2 lb

## 2014-05-06 DIAGNOSIS — R200 Anesthesia of skin: Secondary | ICD-10-CM

## 2014-05-06 DIAGNOSIS — R5383 Other fatigue: Secondary | ICD-10-CM

## 2014-05-06 DIAGNOSIS — R5381 Other malaise: Secondary | ICD-10-CM

## 2014-05-06 DIAGNOSIS — M542 Cervicalgia: Secondary | ICD-10-CM

## 2014-05-06 DIAGNOSIS — R209 Unspecified disturbances of skin sensation: Secondary | ICD-10-CM

## 2014-05-06 MED ORDER — CYCLOBENZAPRINE HCL 7.5 MG PO TABS: 7.5000 mg | ORAL_TABLET | Freq: Three times a day (TID) | ORAL | Status: DC | PRN

## 2014-05-06 NOTE — Telephone Encounter (Signed)
Returned his call, all questions were answered. He will continue to use flexeril as needed. Will start using the hot tub at his gym, work on stretching and relaxation exercises for his neck. We will hold off on an EMG/NCS at this time. He will call me in a few months to update me on his status.

## 2014-05-06 NOTE — Telephone Encounter (Signed)
Called and left VM message that patient may continue to f/u with Dr Sumner(per Dr Janann Colonel) and that the EMG has not been scheduled, per OV notes(05/06/14) that says it may be ordered if symptoms continue.

## 2014-05-06 NOTE — Telephone Encounter (Signed)
Please advise previous note. Thanks  °
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
## 2014-05-06 NOTE — Telephone Encounter (Signed)
William Chase at check out today states that he does not want to see the nurse practitioner anymore, he just wants to see Dr. Janann Colonel. William Chase also wants a return call to discuss whether the nerve conduction and EMG test is necessary, please return call to William Chase and advise.

## 2014-05-06 NOTE — Patient Instructions (Signed)
Patient to continue cyclobenzaprine as directed Patient is already on Valium Follow up in 6 months

## 2014-05-12 ENCOUNTER — Ambulatory Visit: Payer: Self-pay | Admitting: Nurse Practitioner

## 2014-05-29 NOTE — Telephone Encounter (Signed)
Noted  

## 2014-06-13 ENCOUNTER — Other Ambulatory Visit: Payer: Self-pay | Admitting: Endocrinology

## 2014-07-23 ENCOUNTER — Other Ambulatory Visit: Payer: Self-pay | Admitting: Endocrinology

## 2014-07-30 ENCOUNTER — Telehealth: Payer: Self-pay

## 2014-07-30 NOTE — Telephone Encounter (Signed)
Pt stated that he has not had an eye exam this year, but plans to schedule one before the end of this year.

## 2014-08-04 ENCOUNTER — Encounter: Payer: Self-pay | Admitting: Endocrinology

## 2014-08-04 ENCOUNTER — Ambulatory Visit: Payer: Medicare Other | Admitting: Endocrinology

## 2014-08-04 ENCOUNTER — Ambulatory Visit (INDEPENDENT_AMBULATORY_CARE_PROVIDER_SITE_OTHER): Payer: Medicare Other | Admitting: Endocrinology

## 2014-08-04 VITALS — BP 124/78 | HR 92 | Temp 97.9°F | Ht 69.0 in | Wt 223.0 lb

## 2014-08-04 DIAGNOSIS — R300 Dysuria: Secondary | ICD-10-CM

## 2014-08-04 DIAGNOSIS — E119 Type 2 diabetes mellitus without complications: Secondary | ICD-10-CM

## 2014-08-04 LAB — HEMOGLOBIN A1C
Hgb A1c MFr Bld: 5.7 % — ABNORMAL HIGH (ref <5.7)
Mean Plasma Glucose: 117 mg/dL — ABNORMAL HIGH (ref <117)

## 2014-08-04 MED ORDER — CIPROFLOXACIN HCL 500 MG PO TABS
500.0000 mg | ORAL_TABLET | Freq: Two times a day (BID) | ORAL | Status: DC
Start: 2014-08-04 — End: 2014-08-29
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
## 2014-08-04 NOTE — Patient Instructions (Addendum)
Blood and urine tests are requested for you today.  We'll contact you with results. i have sent a prescription to your pharmacy, for an antibiotic pill. I hope you feel better soon.  If you don't feel better by next week, please call back.  Please call sooner if you get worse.

## 2014-08-06 LAB — CULTURE, URINE COMPREHENSIVE: Colony Count: 30000

## 2014-08-22 ENCOUNTER — Other Ambulatory Visit: Payer: Self-pay | Admitting: Endocrinology

## 2014-08-28 ENCOUNTER — Other Ambulatory Visit: Payer: Self-pay | Admitting: Endocrinology

## 2014-08-28 MED ORDER — LISINOPRIL-HYDROCHLOROTHIAZIDE 10-12.5 MG PO TABS: ORAL_TABLET | ORAL | Status: DC

## 2014-08-28 NOTE — Telephone Encounter (Signed)
please call patient: Do you still have urine sxs? If so, please let us know, so i can request for you to see a urologist.

## 2014-08-28 NOTE — Telephone Encounter (Signed)
Cipro denied. Lisinopril refilled. Pt coming for appointment on 11/6 at 1115 am.

## 2014-08-28 NOTE — Telephone Encounter (Signed)
Contacted pt he states that he has not had any urine sxs. Pt stated he is just having the coughing sxs.

## 2014-08-28 NOTE — Telephone Encounter (Signed)
Ok, please decline the cipro, and please refill the lisinopril-HCT prn

## 2014-08-28 NOTE — Telephone Encounter (Signed)
Pt states that he needs the Cipro because he has been coughing up blood for 2 two days. Please advise, Thanks!

## 2014-08-28 NOTE — Telephone Encounter (Signed)
Please refill zestoretic and decline cipro

## 2014-08-29 ENCOUNTER — Ambulatory Visit: Payer: Medicare Other | Admitting: Endocrinology

## 2014-08-29 ENCOUNTER — Ambulatory Visit (INDEPENDENT_AMBULATORY_CARE_PROVIDER_SITE_OTHER): Payer: Medicare Other | Admitting: Endocrinology

## 2014-08-29 ENCOUNTER — Ambulatory Visit
Admission: RE | Admit: 2014-08-29 | Discharge: 2014-08-29 | Disposition: A | Discharge status: Home / Self Care | Payer: Medicare Other | Source: Ambulatory Visit | Attending: Endocrinology | Admitting: Endocrinology

## 2014-08-29 ENCOUNTER — Ambulatory Visit (INDEPENDENT_AMBULATORY_CARE_PROVIDER_SITE_OTHER): Payer: Medicare Other

## 2014-08-29 ENCOUNTER — Other Ambulatory Visit (INDEPENDENT_AMBULATORY_CARE_PROVIDER_SITE_OTHER): Payer: Medicare Other

## 2014-08-29 ENCOUNTER — Encounter: Payer: Self-pay | Admitting: Endocrinology

## 2014-08-29 VITALS — BP 160/90 | HR 90 | Temp 97.4°F | Ht 69.0 in | Wt 221.0 lb

## 2014-08-29 DIAGNOSIS — J189 Pneumonia, unspecified organism: Secondary | ICD-10-CM

## 2014-08-29 DIAGNOSIS — R042 Hemoptysis: Secondary | ICD-10-CM

## 2014-08-29 LAB — CBC WITH DIFFERENTIAL/PLATELET
BASOS ABS: 0 10*3/uL (ref 0.0–0.1)
BASOS PCT: 0.3 % (ref 0.0–3.0)
EOS PCT: 0.6 % (ref 0.0–5.0)
Eosinophils Absolute: 0.1 10*3/uL (ref 0.0–0.7)
HEMATOCRIT: 42.9 % (ref 39.0–52.0)
Hemoglobin: 14.4 g/dL (ref 13.0–17.0)
LYMPHS ABS: 2 10*3/uL (ref 0.7–4.0)
LYMPHS PCT: 16.5 % (ref 12.0–46.0)
MCHC: 33.5 g/dL (ref 30.0–36.0)
MCV: 84.4 fl (ref 78.0–100.0)
Monocytes Absolute: 0.8 10*3/uL (ref 0.1–1.0)
Monocytes Relative: 6.9 % (ref 3.0–12.0)
Neutro Abs: 9.3 10*3/uL — ABNORMAL HIGH (ref 1.4–7.7)
Neutrophils Relative %: 75.7 % (ref 43.0–77.0)
Platelets: 367 10*3/uL (ref 150.0–400.0)
RBC: 5.08 Mil/uL (ref 4.22–5.81)
RDW: 13.1 % (ref 11.5–15.5)
WBC: 12.2 10*3/uL — AB (ref 4.0–10.5)

## 2014-08-29 MED ORDER — CEFTRIAXONE SODIUM 1 G IJ SOLR: 1.0000 g | Freq: Once | INTRAMUSCULAR | Status: DC

## 2014-08-29 MED ORDER — CEFTRIAXONE SODIUM 1 G IJ SOLR
1.0000 g | Freq: Once | INTRAMUSCULAR | Status: AC
  Administered 2014-08-29: 1 g via INTRAMUSCULAR

## 2014-08-29 MED ORDER — CEFUROXIME AXETIL 500 MG PO TABS: 500.0000 mg | ORAL_TABLET | Freq: Two times a day (BID) | ORAL | Status: DC

## 2014-08-29 NOTE — Patient Instructions (Addendum)
blood tests are being requested for you today.  Please do at the old office.  We'll contact you with results. then please go to the first floor, for an antibiotic shot.   i have sent a prescription to your pharmacy, for an antibiotic pill.   Please come back for a follow-up appointment in 3 days.  Go to the ER if you get worse (especially shortness of breath, coughing up a lot of blood, or severe dizziness).  Until you feel better: rest, take tylenol, and drink plenty of fluids.

## 2014-08-29 NOTE — Progress Notes (Signed)
Pre visit review using our clinic review tool, if applicable. No additional management support is needed unless otherwise documented below in the visit note. 

## 2014-08-29 NOTE — Telephone Encounter (Signed)
Patient is over at [facility] lab to receive and injection (antibiotic) patient need a Dx code. Please advise
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
## 2014-08-29 NOTE — Telephone Encounter (Addendum)
Please advise dx code?

## 2014-08-29 NOTE — Telephone Encounter (Signed)
Please refill prn 

## 2014-09-03 ENCOUNTER — Ambulatory Visit (INDEPENDENT_AMBULATORY_CARE_PROVIDER_SITE_OTHER): Payer: Medicare Other | Admitting: Endocrinology

## 2014-09-03 ENCOUNTER — Encounter: Payer: Self-pay | Admitting: Endocrinology

## 2014-09-03 ENCOUNTER — Telehealth: Payer: Self-pay | Admitting: Nurse Practitioner

## 2014-09-03 VITALS — BP 136/88 | HR 92 | Temp 97.7°F | Ht 69.0 in | Wt 217.0 lb

## 2014-09-03 DIAGNOSIS — J189 Pneumonia, unspecified organism: Secondary | ICD-10-CM

## 2014-09-03 DIAGNOSIS — R200 Anesthesia of skin: Secondary | ICD-10-CM

## 2014-09-03 DIAGNOSIS — M542 Cervicalgia: Secondary | ICD-10-CM

## 2014-09-03 NOTE — Patient Instructions (Signed)
Please finish the antibiotic pills.   Please come back for a follow-up appointment in 1 month.   I hope you feel better soon.  If you don't continue feel better, please call back.  Please call sooner if you get worse.
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
## 2014-09-03 NOTE — Telephone Encounter (Signed)
Patient is calling. Patient continues to have muscle spasms which gives him headaches. Flexeril does not help. Please call patient and advise what he needs to do. Thank you.

## 2014-09-04 NOTE — Telephone Encounter (Signed)
Called patient to let them know that CM left early yesterday and will get this message today.

## 2014-09-04 NOTE — Telephone Encounter (Signed)
Per last visit note if Flexeril did not work would get EMG/ [place] study to see if there is a problem with his peripheral nerves  and muscles. Please let him know someone will call to schedule this

## 2014-09-04 NOTE — Telephone Encounter (Signed)
I called and notified the patient to be expecting a call to schedule the EMG/NCS.  Patient states that he has pneumonia right now.  Forwarding to EMG Dept.

## 2014-09-10 NOTE — Telephone Encounter (Signed)
Called dtr. Marisa Cyphers for scheduling dopplers.  Sf Olivia Mackie we do not schedule the NCV/EMG studies.  Thx. SF

## 2014-09-22 ENCOUNTER — Other Ambulatory Visit: Payer: Self-pay | Admitting: Endocrinology

## 2014-09-25 NOTE — Telephone Encounter (Signed)
I have sent two Staff messages to our scheduling and referral department to get this scheduled.

## 2014-09-26 NOTE — Telephone Encounter (Signed)
This test has been scheduled for this patient since 09/05/14 it is scheduled for 10/09/14 @ 1pm.

## 2014-10-03 ENCOUNTER — Ambulatory Visit: Payer: Medicare Other | Admitting: Endocrinology

## 2014-10-09 ENCOUNTER — Ambulatory Visit (INDEPENDENT_AMBULATORY_CARE_PROVIDER_SITE_OTHER): Payer: Medicare Other | Admitting: Diagnostic Neuroimaging

## 2014-10-09 ENCOUNTER — Encounter (INDEPENDENT_AMBULATORY_CARE_PROVIDER_SITE_OTHER): Payer: Self-pay | Admitting: Diagnostic Neuroimaging

## 2014-10-09 DIAGNOSIS — R200 Anesthesia of skin: Secondary | ICD-10-CM

## 2014-10-09 DIAGNOSIS — M542 Cervicalgia: Secondary | ICD-10-CM

## 2014-10-09 DIAGNOSIS — Z0289 Encounter for other administrative examinations: Secondary | ICD-10-CM

## 2014-10-09 NOTE — Procedures (Signed)
   [facility]  NCS (NERVE CONDUCTION STUDY) WITH EMG (ELECTROMYOGRAPHY) REPORT   STUDY DATE: 10/09/14  PATIENT NAME: William Chase DOB: 04/30/1966 MRN: [id number]  ORDERING CLINICIAN: Daun Peacock, NP  TECHNOLOGIST: Laretta Alstrom  ELECTROMYOGRAPHER: Earlean Polka. Penumalli, MD  CLINICAL INFORMATION: 48 year old male with neck pain.  FINDINGS: NERVE CONDUCTION STUDY: Bilateral median and bilateral ulnar motor responses and F wave latencies are normal. Bilateral median and bilateral ulnar sensory responses are normal.  NEEDLE ELECTROMYOGRAPHY: Right upper extremity deltoid, biceps, triceps, flexor carpi radialis, first dorsal interosseous and right C6-7 paraspinal muscles are normal.   IMPRESSION:  This is a normal study.    INTERPRETING PHYSICIAN:  Penni Bombard, MD Certified in Neurology, Neurophysiology and Neuroimaging  [facility] [address], [place], [place] [address] [phone]

## 2014-10-22 ENCOUNTER — Other Ambulatory Visit: Payer: Self-pay | Admitting: Endocrinology

## 2014-10-23 ENCOUNTER — Other Ambulatory Visit: Payer: Self-pay | Admitting: *Deleted

## 2014-10-23 MED ORDER — SIMVASTATIN 80 MG PO TABS: 80.0000 mg | ORAL_TABLET | Freq: Every day | ORAL | Status: DC

## 2014-10-30 ENCOUNTER — Telehealth: Payer: Self-pay | Admitting: Endocrinology

## 2014-10-30 NOTE — Telephone Encounter (Signed)
Pt coming in for office visit 10/31/2013 at 1115 am

## 2014-10-30 NOTE — Telephone Encounter (Signed)
i hear your situation.  However, for your safety, please see someone about your symptoms.

## 2014-10-30 NOTE — Telephone Encounter (Signed)
Pt states he has pneumonia again and is requesting we send in a rx for him. He does not have the money to come for a visit with [place]. Please advise.

## 2014-10-30 NOTE — Telephone Encounter (Signed)
See below and please advise, Thanks!  

## 2014-10-31 ENCOUNTER — Ambulatory Visit (INDEPENDENT_AMBULATORY_CARE_PROVIDER_SITE_OTHER)
Admission: RE | Admit: 2014-10-31 | Discharge: 2014-10-31 | Disposition: A | Discharge status: Home / Self Care | Payer: Medicare Other | Source: Ambulatory Visit | Attending: Endocrinology | Admitting: Endocrinology

## 2014-10-31 ENCOUNTER — Ambulatory Visit (INDEPENDENT_AMBULATORY_CARE_PROVIDER_SITE_OTHER): Payer: Medicare Other | Admitting: Endocrinology

## 2014-10-31 ENCOUNTER — Encounter: Payer: Self-pay | Admitting: Endocrinology

## 2014-10-31 VITALS — BP 134/82 | HR 85 | Temp 98.2°F | Ht 69.0 in

## 2014-10-31 DIAGNOSIS — J189 Pneumonia, unspecified organism: Secondary | ICD-10-CM

## 2014-10-31 DIAGNOSIS — J40 Bronchitis, not specified as acute or chronic: Secondary | ICD-10-CM | POA: Diagnosis not present

## 2014-10-31 MED ORDER — AMOXICILLIN-POT CLAVULANATE 875-125 MG PO TABS: 1.0000 | ORAL_TABLET | Freq: Two times a day (BID) | ORAL | Status: DC

## 2014-10-31 NOTE — Patient Instructions (Addendum)
i have sent a prescription to your pharmacy, for an antibiotic pill.  Please check a chest-x-ray, in the basement of the old office. I hope you feel better soon.  If you don't feel better by next week, please call back.  Please go to the emergency room sooner if you feel worse.
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
## 2014-11-03 ENCOUNTER — Telehealth: Payer: Self-pay | Admitting: Nurse Practitioner

## 2014-11-03 MED ORDER — CYCLOBENZAPRINE HCL 7.5 MG PO TABS: 7.5000 mg | ORAL_TABLET | Freq: Three times a day (TID) | ORAL | Status: DC | PRN

## 2014-11-03 NOTE — Telephone Encounter (Signed)
Patient requesting Rx refill for cyclobenzaprine (FEXMID) 7.5 MG tablet.  Patient's completely out of medication.  Please call and advise.

## 2014-11-03 NOTE — Telephone Encounter (Signed)
I called back.  He is aware.
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
## 2014-11-03 NOTE — Telephone Encounter (Signed)
He can go back to previous dose of flexeril.

## 2014-11-19 DIAGNOSIS — M79662 Pain in left lower leg: Secondary | ICD-10-CM | POA: Diagnosis not present

## 2014-11-19 DIAGNOSIS — Z79891 Long term (current) use of opiate analgesic: Secondary | ICD-10-CM | POA: Diagnosis not present

## 2014-11-19 DIAGNOSIS — M542 Cervicalgia: Secondary | ICD-10-CM | POA: Diagnosis not present

## 2014-11-19 DIAGNOSIS — G894 Chronic pain syndrome: Secondary | ICD-10-CM | POA: Diagnosis not present

## 2014-11-22 ENCOUNTER — Other Ambulatory Visit: Payer: Self-pay | Admitting: Endocrinology

## 2014-12-17 ENCOUNTER — Other Ambulatory Visit: Payer: Self-pay | Admitting: Endocrinology

## 2014-12-22 ENCOUNTER — Telehealth: Payer: Self-pay | Admitting: Endocrinology

## 2014-12-22 NOTE — Telephone Encounter (Signed)
See note below and please advise, Thanks! 

## 2014-12-22 NOTE — Telephone Encounter (Signed)
Pt advised, scheduled for 12/24/2014 at 215.

## 2014-12-22 NOTE — Telephone Encounter (Signed)
Patient

## 2014-12-22 NOTE — Telephone Encounter (Signed)
Patient called stating that he has the same chest cold He would like Dr. Loanne Drilling to please call in antibiotics     Please advise    Thank you

## 2014-12-22 NOTE — Telephone Encounter (Signed)
Please advise ov.  Please understand this is for your safety.

## 2014-12-24 ENCOUNTER — Encounter: Payer: Self-pay | Admitting: Endocrinology

## 2014-12-24 ENCOUNTER — Ambulatory Visit (INDEPENDENT_AMBULATORY_CARE_PROVIDER_SITE_OTHER): Payer: Medicare Other | Admitting: Endocrinology

## 2014-12-24 VITALS — BP 130/76 | HR 76 | Temp 98.0°F | Ht 69.0 in | Wt 209.0 lb

## 2014-12-24 DIAGNOSIS — J209 Acute bronchitis, unspecified: Secondary | ICD-10-CM

## 2014-12-24 MED ORDER — FLUTICASONE-SALMETEROL 100-50 MCG/DOSE IN AEPB: 1.0000 | INHALATION_SPRAY | Freq: Two times a day (BID) | RESPIRATORY_TRACT | Status: DC

## 2014-12-24 MED ORDER — DOXYCYCLINE HYCLATE 100 MG PO TABS: 100.0000 mg | ORAL_TABLET | Freq: Two times a day (BID) | ORAL | Status: DC

## 2014-12-24 MED ORDER — BENZONATATE 200 MG PO CAPS: 200.0000 mg | ORAL_CAPSULE | Freq: Three times a day (TID) | ORAL | Status: DC | PRN

## 2014-12-24 MED ORDER — HYDROCORTISONE 1 % EX LOTN: 1.0000 "application " | TOPICAL_LOTION | Freq: Four times a day (QID) | CUTANEOUS | Status: DC

## 2014-12-24 NOTE — Patient Instructions (Signed)
i have sent 4 prescription to your pharmacy: ear drops, antibiotic pill, cough pills, and inhaler. I hope you feel better soon.  If you don't feel better by next week, please call back.  Please call sooner if you get worse.
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
## 2014-12-25 ENCOUNTER — Telehealth: Payer: Self-pay | Admitting: Endocrinology

## 2014-12-25 DIAGNOSIS — J209 Acute bronchitis, unspecified: Secondary | ICD-10-CM | POA: Insufficient documentation

## 2014-12-25 NOTE — Telephone Encounter (Signed)
Lvom advising of note below. Requested call back if pt would like to discuss.  

## 2014-12-25 NOTE — Telephone Encounter (Signed)
Patient stated that he did not ask for the antibiotics, but he need the cough pills. Please advise

## 2014-12-25 NOTE — Telephone Encounter (Signed)
See note below and please advise, Thanks! 

## 2014-12-25 NOTE — Telephone Encounter (Signed)
The cough pills will not be covered by his insurance they cost $45. He needs an alternate.

## 2014-12-25 NOTE — Telephone Encounter (Signed)
We are really limited, because of interactions with other meds. Take non-prescription robitussin-DM

## 2014-12-26 NOTE — Telephone Encounter (Signed)
Contacted pt and advised the cough pills we sent were the safest for the pt to take due to possible interactions. Pt voiced understanding. Pt sates that since he has been taking the antibiotics he has been feeling better.

## 2015-01-05 ENCOUNTER — Telehealth: Payer: Self-pay | Admitting: Endocrinology

## 2015-01-05 NOTE — Telephone Encounter (Signed)
Pt thinks the antibiotics are causing him heartburn. He just took his last pill. No vomiting but is nausous. Is not having diarrhea. Tums is not working. pepto isnt working please advise

## 2015-01-05 NOTE — Telephone Encounter (Signed)
See note below and please advise, thanks!  

## 2015-01-05 NOTE — Telephone Encounter (Signed)
please call patient: To make sure this is not coming from your heart, you should see a doctor or hospital for this.

## 2015-01-06 NOTE — Telephone Encounter (Signed)
Please take prilosec OTC, 2x20 mg per day

## 2015-01-06 NOTE — Telephone Encounter (Signed)
Pt advised of note below and voiced understanding.  

## 2015-01-06 NOTE — Telephone Encounter (Signed)
Contacted pt and advised of note below. Pt sates that he knows the heart burn is not coming from his heart. Since he completed his antibiotic the heart burn type feeling has not be as severe. Pt states if the issue persists he will let us know.

## 2015-01-17 ENCOUNTER — Other Ambulatory Visit: Payer: Self-pay | Admitting: Endocrinology

## 2015-01-26 DIAGNOSIS — M542 Cervicalgia: Secondary | ICD-10-CM | POA: Diagnosis not present

## 2015-01-26 DIAGNOSIS — M79662 Pain in left lower leg: Secondary | ICD-10-CM | POA: Diagnosis not present

## 2015-01-26 DIAGNOSIS — Z79891 Long term (current) use of opiate analgesic: Secondary | ICD-10-CM | POA: Diagnosis not present

## 2015-01-26 DIAGNOSIS — G894 Chronic pain syndrome: Secondary | ICD-10-CM | POA: Diagnosis not present

## 2015-02-13 ENCOUNTER — Other Ambulatory Visit: Payer: Self-pay | Admitting: Endocrinology

## 2015-02-24 ENCOUNTER — Other Ambulatory Visit: Payer: Self-pay | Admitting: Endocrinology

## 2015-03-20 ENCOUNTER — Other Ambulatory Visit: Payer: Self-pay | Admitting: Endocrinology

## 2015-03-25 DIAGNOSIS — M79662 Pain in left lower leg: Secondary | ICD-10-CM | POA: Diagnosis not present

## 2015-03-25 DIAGNOSIS — M542 Cervicalgia: Secondary | ICD-10-CM | POA: Diagnosis not present

## 2015-03-25 DIAGNOSIS — G894 Chronic pain syndrome: Secondary | ICD-10-CM | POA: Diagnosis not present

## 2015-03-25 DIAGNOSIS — Z79891 Long term (current) use of opiate analgesic: Secondary | ICD-10-CM | POA: Diagnosis not present

## 2015-04-20 ENCOUNTER — Other Ambulatory Visit: Payer: Self-pay

## 2015-04-21 ENCOUNTER — Other Ambulatory Visit: Payer: Self-pay | Admitting: Endocrinology

## 2015-05-18 ENCOUNTER — Other Ambulatory Visit: Payer: Self-pay | Admitting: Endocrinology

## 2015-05-20 DIAGNOSIS — Z79891 Long term (current) use of opiate analgesic: Secondary | ICD-10-CM | POA: Diagnosis not present

## 2015-05-20 DIAGNOSIS — G894 Chronic pain syndrome: Secondary | ICD-10-CM | POA: Diagnosis not present

## 2015-05-20 DIAGNOSIS — M542 Cervicalgia: Secondary | ICD-10-CM | POA: Diagnosis not present

## 2015-05-20 DIAGNOSIS — M79662 Pain in left lower leg: Secondary | ICD-10-CM | POA: Diagnosis not present

## 2015-05-29 ENCOUNTER — Other Ambulatory Visit: Payer: Self-pay | Admitting: Endocrinology

## 2015-06-25 ENCOUNTER — Emergency Department (HOSPITAL_COMMUNITY)
Admission: EM | Admit: 2015-06-25 | Discharge: 2015-06-26 | Disposition: A | Discharge status: Home / Self Care | Payer: Medicare Other | Attending: Emergency Medicine | Admitting: Emergency Medicine

## 2015-06-25 DIAGNOSIS — Y998 Other external cause status: Secondary | ICD-10-CM | POA: Diagnosis not present

## 2015-06-25 DIAGNOSIS — E785 Hyperlipidemia, unspecified: Secondary | ICD-10-CM | POA: Insufficient documentation

## 2015-06-25 DIAGNOSIS — Y9389 Activity, other specified: Secondary | ICD-10-CM | POA: Insufficient documentation

## 2015-06-25 DIAGNOSIS — K219 Gastro-esophageal reflux disease without esophagitis: Secondary | ICD-10-CM | POA: Insufficient documentation

## 2015-06-25 DIAGNOSIS — Z87448 Personal history of other diseases of urinary system: Secondary | ICD-10-CM | POA: Insufficient documentation

## 2015-06-25 DIAGNOSIS — I10 Essential (primary) hypertension: Secondary | ICD-10-CM | POA: Diagnosis not present

## 2015-06-25 DIAGNOSIS — Z79899 Other long term (current) drug therapy: Secondary | ICD-10-CM | POA: Diagnosis not present

## 2015-06-25 DIAGNOSIS — S80211A Abrasion, right knee, initial encounter: Secondary | ICD-10-CM | POA: Diagnosis not present

## 2015-06-25 DIAGNOSIS — Z8739 Personal history of other diseases of the musculoskeletal system and connective tissue: Secondary | ICD-10-CM | POA: Diagnosis not present

## 2015-06-25 DIAGNOSIS — Z8669 Personal history of other diseases of the nervous system and sense organs: Secondary | ICD-10-CM | POA: Diagnosis not present

## 2015-06-25 DIAGNOSIS — S29001A Unspecified injury of muscle and tendon of front wall of thorax, initial encounter: Secondary | ICD-10-CM | POA: Diagnosis present

## 2015-06-25 DIAGNOSIS — S80212A Abrasion, left knee, initial encounter: Secondary | ICD-10-CM | POA: Diagnosis not present

## 2015-06-25 DIAGNOSIS — Y92410 Unspecified street and highway as the place of occurrence of the external cause: Secondary | ICD-10-CM | POA: Insufficient documentation

## 2015-06-25 DIAGNOSIS — R0781 Pleurodynia: Secondary | ICD-10-CM | POA: Diagnosis not present

## 2015-06-25 DIAGNOSIS — F319 Bipolar disorder, unspecified: Secondary | ICD-10-CM | POA: Diagnosis not present

## 2015-06-25 DIAGNOSIS — S20212A Contusion of left front wall of thorax, initial encounter: Secondary | ICD-10-CM | POA: Insufficient documentation

## 2015-06-25 DIAGNOSIS — T07XXXA Unspecified multiple injuries, initial encounter: Secondary | ICD-10-CM

## 2015-06-26 ENCOUNTER — Encounter (HOSPITAL_COMMUNITY): Payer: Self-pay | Admitting: Emergency Medicine

## 2015-06-26 ENCOUNTER — Emergency Department (HOSPITAL_COMMUNITY): Payer: Medicare Other

## 2015-06-26 DIAGNOSIS — R0781 Pleurodynia: Secondary | ICD-10-CM | POA: Diagnosis not present

## 2015-06-26 MED ORDER — TETANUS-DIPHTH-ACELL PERTUSSIS 5-2.5-18.5 LF-MCG/0.5 IM SUSP
0.5000 mL | Freq: Once | INTRAMUSCULAR | Status: AC
  Administered 2015-06-26: 0.5 mL via INTRAMUSCULAR
  Filled 2015-06-26: qty 0.5

## 2015-06-26 MED ORDER — CYCLOBENZAPRINE HCL 5 MG PO TABS: 5.0000 mg | ORAL_TABLET | Freq: Three times a day (TID) | ORAL | Status: DC | PRN

## 2015-06-26 MED ORDER — BACITRACIN ZINC 500 UNIT/GM EX OINT
TOPICAL_OINTMENT | CUTANEOUS | Status: AC
  Administered 2015-06-26: 02:00:00
  Filled 2015-06-26: qty 0.9

## 2015-06-26 MED ORDER — HYDROCODONE-ACETAMINOPHEN 5-325 MG PO TABS: 2.0000 | ORAL_TABLET | ORAL | Status: DC | PRN

## 2015-06-26 MED ORDER — IBUPROFEN 600 MG PO TABS: 600.0000 mg | ORAL_TABLET | Freq: Four times a day (QID) | ORAL | Status: DC | PRN

## 2015-06-26 MED ORDER — HYDROCODONE-ACETAMINOPHEN 5-325 MG PO TABS
1.0000 | ORAL_TABLET | Freq: Once | ORAL | Status: AC
  Administered 2015-06-26: 1 via ORAL
  Filled 2015-06-26: qty 1

## 2015-06-26 MED ORDER — KETOROLAC TROMETHAMINE 30 MG/ML IJ SOLN
30.0000 mg | Freq: Once | INTRAMUSCULAR | Status: AC
  Administered 2015-06-26: 30 mg via INTRAMUSCULAR
  Filled 2015-06-26: qty 1

## 2015-06-26 NOTE — Discharge Instructions (Signed)
Abrasions An abrasion is a cut or scrape of the skin. Abrasions do not go through all layers of the skin. HOME CARE  If a bandage (dressing) was put on your wound, change it as told by your doctor. If the bandage sticks, soak it off with warm.  Wash the area with water and soap 2 times a day. Rinse off the soap. Pat the area dry with a clean towel.  Put on medicated cream (ointment) as told by your doctor.  Change your bandage right away if it gets wet or dirty.  Only take medicine as told by your doctor.  See your doctor within 24-48 hours to get your wound checked.  Check your wound for redness, puffiness (swelling), or yellowish-white fluid (pus). GET HELP RIGHT AWAY IF:   You have more pain in the wound.  You have redness, swelling, or tenderness around the wound.  You have pus coming from the wound.  You have a fever or lasting symptoms for more than 2-3 days.  You have a fever and your symptoms suddenly get worse.  You have a bad smell coming from the wound or bandage. MAKE SURE YOU:   Understand these instructions.  Will watch your condition.  Will get help right away if you are not doing well or get worse. Document Released: 03/28/2008 Document Revised: 07/04/2012 Document Reviewed: 09/13/2011 [facility] Patient Information 2015 [place], [place]. This information is not intended to replace advice given to you by your health care provider. Make sure you discuss any questions you have with your health care provider.  Blunt Chest Trauma Blunt chest trauma is an injury caused by a blow to the chest. These chest injuries can be very painful. Blunt chest trauma often results in bruised or broken (fractured) ribs. Most cases of bruised and fractured ribs from blunt chest traumas get better after 1 to 3 weeks of rest and pain medicine. Often, the soft tissue in the chest wall is also injured, causing pain and bruising. Internal organs, such as the heart and lungs, may also be  injured. Blunt chest trauma can lead to serious medical problems. This injury requires immediate medical care. CAUSES   Motor vehicle collisions.  Falls.  Physical violence.  Sports injuries. SYMPTOMS   Chest pain. The pain may be worse when you move or breathe deeply.  Shortness of breath.  Lightheadedness.  Bruising.  Tenderness.  Swelling. DIAGNOSIS  Your caregiver will do a physical exam. X-rays may be taken to look for fractures. However, minor rib fractures may not show up on X-rays until a few days after the injury. If a more serious injury is suspected, further imaging tests may be done. This may include ultrasounds, computed tomography (CT) scans, or magnetic resonance imaging (MRI). TREATMENT  Treatment depends on the severity of your injury. Your caregiver may prescribe pain medicines and deep breathing exercises. HOME CARE INSTRUCTIONS  Limit your activities until you can move around without much pain.  Do not do any strenuous work until your injury is healed.  Put ice on the injured area.  Put ice in a plastic bag.  Place a towel between your skin and the bag.  Leave the ice on for 15-20 minutes, 03-04 times a day.  You may wear a rib belt as directed by your caregiver to reduce pain.  Practice deep breathing as directed by your caregiver to keep your lungs clear.  Only take over-the-counter or prescription medicines for pain, fever, or discomfort as directed by your  caregiver. SEEK IMMEDIATE MEDICAL CARE IF:   You have increasing pain or shortness of breath.  You cough up blood.  You have nausea, vomiting, or abdominal pain.  You have a fever.  You feel dizzy, weak, or you faint. MAKE SURE YOU:  Understand these instructions.  Will watch your condition.  Will get help right away if you are not doing well or get worse. Document Released: 11/17/2004 Document Revised: 01/02/2012 Document Reviewed: 07/27/2011 [facility] Patient Information  2015 [place], [place]. This information is not intended to replace advice given to you by your health care provider. Make sure you discuss any questions you have with your health care provider. Chest x-ray is negative for any fractures.  You've been given pain medication and inflammatory and muscle relaxer.  Follow-up with your primary care physician as needed
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
## 2015-07-22 DIAGNOSIS — M47812 Spondylosis without myelopathy or radiculopathy, cervical region: Secondary | ICD-10-CM | POA: Diagnosis not present

## 2015-07-22 DIAGNOSIS — M79662 Pain in left lower leg: Secondary | ICD-10-CM | POA: Diagnosis not present

## 2015-07-22 DIAGNOSIS — G894 Chronic pain syndrome: Secondary | ICD-10-CM | POA: Diagnosis not present

## 2015-07-22 DIAGNOSIS — Z79891 Long term (current) use of opiate analgesic: Secondary | ICD-10-CM | POA: Diagnosis not present

## 2015-08-02 ENCOUNTER — Other Ambulatory Visit: Payer: Self-pay | Admitting: Endocrinology

## 2015-08-22 ENCOUNTER — Other Ambulatory Visit (HOSPITAL_COMMUNITY): Payer: Self-pay | Admitting: Psychiatry

## 2015-09-02 ENCOUNTER — Other Ambulatory Visit: Payer: Self-pay | Admitting: Endocrinology

## 2015-09-07 ENCOUNTER — Telehealth: Payer: Self-pay | Admitting: Endocrinology

## 2015-09-07 NOTE — Telephone Encounter (Signed)
Ov would be needed to consider, due to pt safety standards You could see someone closer to your home, or you could have ov here

## 2015-09-07 NOTE — Telephone Encounter (Signed)
Patient stated he has a bad cold, would you call him some medication in to his pharmacy, please advise

## 2015-09-07 NOTE — Telephone Encounter (Signed)
I contacted the pt and advised of note below. Pt voiced understanding and stated he was going to wait a few more days and see if he starts feeling better. If not, the pt will call back the schedule his office visit.

## 2015-09-07 NOTE — Telephone Encounter (Signed)
See note below and please advise, Thanks! 

## 2015-09-16 DIAGNOSIS — G894 Chronic pain syndrome: Secondary | ICD-10-CM | POA: Diagnosis not present

## 2015-09-16 DIAGNOSIS — M79662 Pain in left lower leg: Secondary | ICD-10-CM | POA: Diagnosis not present

## 2015-09-16 DIAGNOSIS — M47812 Spondylosis without myelopathy or radiculopathy, cervical region: Secondary | ICD-10-CM | POA: Diagnosis not present

## 2015-09-16 DIAGNOSIS — Z79891 Long term (current) use of opiate analgesic: Secondary | ICD-10-CM | POA: Diagnosis not present

## 2015-09-23 ENCOUNTER — Encounter: Payer: Medicare Other | Admitting: Endocrinology

## 2015-10-08 ENCOUNTER — Ambulatory Visit (INDEPENDENT_AMBULATORY_CARE_PROVIDER_SITE_OTHER): Payer: Medicare Other | Admitting: Endocrinology

## 2015-10-08 ENCOUNTER — Encounter: Payer: Self-pay | Admitting: Endocrinology

## 2015-10-08 VITALS — BP 132/72 | HR 102 | Temp 97.6°F | Ht 69.0 in | Wt 217.0 lb

## 2015-10-08 DIAGNOSIS — I10 Essential (primary) hypertension: Secondary | ICD-10-CM

## 2015-10-08 DIAGNOSIS — Z0000 Encounter for general adult medical examination without abnormal findings: Secondary | ICD-10-CM

## 2015-10-08 DIAGNOSIS — Z23 Encounter for immunization: Secondary | ICD-10-CM | POA: Diagnosis not present

## 2015-10-08 DIAGNOSIS — E079 Disorder of thyroid, unspecified: Secondary | ICD-10-CM

## 2015-10-08 DIAGNOSIS — E785 Hyperlipidemia, unspecified: Secondary | ICD-10-CM | POA: Diagnosis not present

## 2015-10-08 DIAGNOSIS — F1021 Alcohol dependence, in remission: Secondary | ICD-10-CM

## 2015-10-08 DIAGNOSIS — E119 Type 2 diabetes mellitus without complications: Secondary | ICD-10-CM

## 2015-10-08 DIAGNOSIS — K769 Liver disease, unspecified: Secondary | ICD-10-CM

## 2015-10-08 LAB — HEPATIC FUNCTION PANEL
ALK PHOS: 96 U/L (ref 39–117)
ALT: 15 U/L (ref 0–53)
AST: 19 U/L (ref 0–37)
Albumin: 4.5 g/dL (ref 3.5–5.2)
BILIRUBIN TOTAL: 0.3 mg/dL (ref 0.2–1.2)
Bilirubin, Direct: 0 mg/dL (ref 0.0–0.3)
Total Protein: 7.7 g/dL (ref 6.0–8.3)

## 2015-10-08 LAB — URINALYSIS, ROUTINE W REFLEX MICROSCOPIC
Bilirubin Urine: NEGATIVE
Hgb urine dipstick: NEGATIVE
KETONES UR: NEGATIVE
Leukocytes, UA: NEGATIVE
Nitrite: NEGATIVE
PH: 5.5 (ref 5.0–8.0)
RBC / HPF: NONE SEEN (ref 0–?)
SPECIFIC GRAVITY, URINE: 1.025 (ref 1.000–1.030)
Total Protein, Urine: NEGATIVE
URINE GLUCOSE: NEGATIVE
Urobilinogen, UA: 0.2 (ref 0.0–1.0)
WBC, UA: NONE SEEN (ref 0–?)

## 2015-10-08 LAB — LIPID PANEL
CHOL/HDL RATIO: 3
Cholesterol: 138 mg/dL (ref 0–200)
HDL: 53.3 mg/dL (ref >39.00)
LDL CALC: 65 mg/dL (ref 0–99)
NonHDL: 85.08
TRIGLYCERIDES: 102 mg/dL (ref 0.0–149.0)
VLDL: 20.4 mg/dL (ref 0.0–40.0)

## 2015-10-08 LAB — CBC WITH DIFFERENTIAL/PLATELET
BASOS PCT: 0.5 % (ref 0.0–3.0)
Basophils Absolute: 0.1 10*3/uL (ref 0.0–0.1)
EOS PCT: 0.7 % (ref 0.0–5.0)
Eosinophils Absolute: 0.1 10*3/uL (ref 0.0–0.7)
HCT: 46.3 % (ref 39.0–52.0)
Hemoglobin: 15.5 g/dL (ref 13.0–17.0)
LYMPHS ABS: 3 10*3/uL (ref 0.7–4.0)
Lymphocytes Relative: 22.7 % (ref 12.0–46.0)
MCHC: 33.4 g/dL (ref 30.0–36.0)
MCV: 84.3 fl (ref 78.0–100.0)
MONO ABS: 1.2 10*3/uL — AB (ref 0.1–1.0)
Monocytes Relative: 9.2 % (ref 3.0–12.0)
NEUTROS PCT: 66.9 % (ref 43.0–77.0)
Neutro Abs: 8.9 10*3/uL — ABNORMAL HIGH (ref 1.4–7.7)
PLATELETS: 341 10*3/uL (ref 150.0–400.0)
RBC: 5.5 Mil/uL (ref 4.22–5.81)
RDW: 13.4 % (ref 11.5–15.5)
WBC: 13.3 10*3/uL — ABNORMAL HIGH (ref 4.0–10.5)

## 2015-10-08 LAB — BASIC METABOLIC PANEL
BUN: 17 mg/dL (ref 6–23)
CHLORIDE: 101 meq/L (ref 96–112)
CO2: 31 mEq/L (ref 19–32)
CREATININE: 1.02 mg/dL (ref 0.40–1.50)
Calcium: 9.7 mg/dL (ref 8.4–10.5)
GFR: 83.22 mL/min (ref >60.00)
Glucose, Bld: 105 mg/dL — ABNORMAL HIGH (ref 70–99)
Potassium: 4.5 mEq/L (ref 3.5–5.1)
Sodium: 138 mEq/L (ref 135–145)

## 2015-10-08 LAB — HEMOGLOBIN A1C: HEMOGLOBIN A1C: 5.5 % (ref 4.6–6.5)

## 2015-10-08 LAB — TSH: TSH: 1.51 u[IU]/mL (ref 0.35–4.50)

## 2015-10-08 LAB — MICROALBUMIN / CREATININE URINE RATIO
CREATININE, U: 75.9 mg/dL
Microalb Creat Ratio: 0.9 mg/g (ref 0.0–30.0)

## 2015-10-08 NOTE — Patient Instructions (Addendum)
please consider these measures for your health:  minimize alcohol.  do not use tobacco products.  have a colonoscopy at least every 10 years from age 49.  keep firearms safely stored.  always use seat belts.  have working smoke alarms in your home.  see an eye doctor and dentist regularly.  never drive under the influence of alcohol or drugs (including prescription drugs).  those with fair skin should take precautions against the sun.  blood tests are requested for you today.  We'll let you know about the results.   Please come back for a follow-up appointment in 6 months.     
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
## 2015-11-11 DIAGNOSIS — M79662 Pain in left lower leg: Secondary | ICD-10-CM | POA: Diagnosis not present

## 2015-11-11 DIAGNOSIS — Z79891 Long term (current) use of opiate analgesic: Secondary | ICD-10-CM | POA: Diagnosis not present

## 2015-11-11 DIAGNOSIS — M47812 Spondylosis without myelopathy or radiculopathy, cervical region: Secondary | ICD-10-CM | POA: Diagnosis not present

## 2015-11-11 DIAGNOSIS — G894 Chronic pain syndrome: Secondary | ICD-10-CM | POA: Diagnosis not present

## 2015-12-01 ENCOUNTER — Other Ambulatory Visit (HOSPITAL_COMMUNITY): Payer: Self-pay | Admitting: Psychiatry

## 2015-12-07 ENCOUNTER — Other Ambulatory Visit: Payer: Self-pay | Admitting: Endocrinology

## 2016-01-08 ENCOUNTER — Other Ambulatory Visit: Payer: Self-pay | Admitting: Endocrinology

## 2016-02-06 ENCOUNTER — Other Ambulatory Visit: Payer: Self-pay | Admitting: Endocrinology

## 2016-02-17 DIAGNOSIS — Z79891 Long term (current) use of opiate analgesic: Secondary | ICD-10-CM | POA: Diagnosis not present

## 2016-02-17 DIAGNOSIS — G894 Chronic pain syndrome: Secondary | ICD-10-CM | POA: Diagnosis not present

## 2016-02-17 DIAGNOSIS — Z79899 Other long term (current) drug therapy: Secondary | ICD-10-CM | POA: Diagnosis not present

## 2016-02-17 DIAGNOSIS — M79662 Pain in left lower leg: Secondary | ICD-10-CM | POA: Diagnosis not present

## 2016-02-17 DIAGNOSIS — M47812 Spondylosis without myelopathy or radiculopathy, cervical region: Secondary | ICD-10-CM | POA: Diagnosis not present

## 2016-03-04 ENCOUNTER — Other Ambulatory Visit: Payer: Self-pay | Admitting: Endocrinology

## 2016-03-19 ENCOUNTER — Other Ambulatory Visit (HOSPITAL_COMMUNITY): Payer: Self-pay | Admitting: Psychiatry

## 2016-03-27 ENCOUNTER — Other Ambulatory Visit (HOSPITAL_COMMUNITY): Payer: Self-pay | Admitting: Psychiatry

## 2016-03-27 ENCOUNTER — Other Ambulatory Visit: Payer: Self-pay | Admitting: Endocrinology

## 2016-04-20 DIAGNOSIS — Z79891 Long term (current) use of opiate analgesic: Secondary | ICD-10-CM | POA: Diagnosis not present

## 2016-04-20 DIAGNOSIS — M79662 Pain in left lower leg: Secondary | ICD-10-CM | POA: Diagnosis not present

## 2016-04-20 DIAGNOSIS — M47812 Spondylosis without myelopathy or radiculopathy, cervical region: Secondary | ICD-10-CM | POA: Diagnosis not present

## 2016-04-20 DIAGNOSIS — G894 Chronic pain syndrome: Secondary | ICD-10-CM | POA: Diagnosis not present

## 2016-04-26 ENCOUNTER — Other Ambulatory Visit: Payer: Self-pay | Admitting: Endocrinology

## 2016-04-27 ENCOUNTER — Other Ambulatory Visit: Payer: Self-pay | Admitting: Endocrinology

## 2016-04-27 ENCOUNTER — Encounter: Payer: Self-pay | Admitting: Gastroenterology

## 2016-06-22 ENCOUNTER — Telehealth: Payer: Self-pay | Admitting: Endocrinology

## 2016-06-22 ENCOUNTER — Ambulatory Visit
Admission: RE | Admit: 2016-06-22 | Discharge: 2016-06-22 | Disposition: A | Discharge status: Home / Self Care | Payer: Medicare Other | Source: Ambulatory Visit | Attending: Endocrinology | Admitting: Endocrinology

## 2016-06-22 ENCOUNTER — Encounter: Payer: Self-pay | Admitting: Endocrinology

## 2016-06-22 ENCOUNTER — Ambulatory Visit (INDEPENDENT_AMBULATORY_CARE_PROVIDER_SITE_OTHER): Payer: Medicare Other | Admitting: Endocrinology

## 2016-06-22 VITALS — BP 128/70 | HR 86 | Ht 69.0 in | Wt 222.0 lb

## 2016-06-22 DIAGNOSIS — E119 Type 2 diabetes mellitus without complications: Secondary | ICD-10-CM | POA: Diagnosis not present

## 2016-06-22 DIAGNOSIS — R05 Cough: Secondary | ICD-10-CM

## 2016-06-22 DIAGNOSIS — R059 Cough, unspecified: Secondary | ICD-10-CM

## 2016-06-22 LAB — POCT GLYCOSYLATED HEMOGLOBIN (HGB A1C): HEMOGLOBIN A1C: 5.6

## 2016-06-22 MED ORDER — FLUTICASONE-SALMETEROL 100-50 MCG/DOSE IN AEPB: 1.0000 | INHALATION_SPRAY | Freq: Two times a day (BID) | RESPIRATORY_TRACT | 3 refills | Status: DC

## 2016-06-22 MED ORDER — PROMETHAZINE-DM 6.25-15 MG/5ML PO SYRP: 5.0000 mL | ORAL_SOLUTION | Freq: Four times a day (QID) | ORAL | 1 refills | Status: DC | PRN

## 2016-06-22 MED ORDER — CEPHALEXIN 500 MG PO CAPS: 500.0000 mg | ORAL_CAPSULE | Freq: Three times a day (TID) | ORAL | 0 refills | Status: DC
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
## 2016-06-22 NOTE — Telephone Encounter (Signed)
We need to add the percocet he takes to his med list but he doesn't remember the dosing

## 2016-06-22 NOTE — Telephone Encounter (Signed)
Patient stated that he he has pneumonia  And coughing up blood. He has no fever or shortness of breath. Coming in at 2:00

## 2016-06-22 NOTE — Telephone Encounter (Signed)
Medication added

## 2016-06-22 NOTE — Patient Instructions (Addendum)
A chest x-ray is requested for you today.  We'll let you know about the results.   I have sent 3 prescriptions to your pharmacy (antibiotic, cough syrup, and inhaler).   I hope you feel better soon.  If you don't feel better by next week, please call back.  Please call sooner if you get worse.

## 2016-06-22 NOTE — Telephone Encounter (Signed)
Noted  

## 2016-06-24 ENCOUNTER — Encounter: Payer: Self-pay | Admitting: Endocrinology

## 2016-06-29 ENCOUNTER — Telehealth: Payer: Self-pay | Admitting: Endocrinology

## 2016-06-29 NOTE — Telephone Encounter (Signed)
Patient need form faxed to his house stateding he has pneumonia.  fax# [phone]

## 2016-06-29 NOTE — Telephone Encounter (Signed)
PT called stating someone from this office was going to fax him his xray results [facility] number phone [phone] [facility] fax number [phone]

## 2016-06-29 NOTE — Telephone Encounter (Signed)
I contacted the patient and advised we have been trying to fax the letter to the number listed below since 06/24/2016. Patient stated he has been having issues with his personal fax and requested the fax to be sent again. Patient stated he is going to contact his lawyer and get her fax number so we can fax the letter to her attention if the fax does not go through from today.

## 2016-07-01 NOTE — Telephone Encounter (Signed)
Letter resubmitted.

## 2016-07-01 NOTE — Telephone Encounter (Signed)
Patient stated these are this is his  fax  Lawyer [phone]. Please resubmit.

## 2016-07-04 NOTE — Telephone Encounter (Signed)
Patient ask you to

## 2016-07-19 ENCOUNTER — Other Ambulatory Visit: Payer: Self-pay | Admitting: Endocrinology

## 2016-08-11 DIAGNOSIS — Z79891 Long term (current) use of opiate analgesic: Secondary | ICD-10-CM | POA: Diagnosis not present

## 2016-08-11 DIAGNOSIS — M47816 Spondylosis without myelopathy or radiculopathy, lumbar region: Secondary | ICD-10-CM | POA: Diagnosis not present

## 2016-08-11 DIAGNOSIS — M79662 Pain in left lower leg: Secondary | ICD-10-CM | POA: Diagnosis not present

## 2016-08-11 DIAGNOSIS — G894 Chronic pain syndrome: Secondary | ICD-10-CM | POA: Diagnosis not present

## 2016-08-12 ENCOUNTER — Telehealth: Payer: Self-pay

## 2016-08-12 ENCOUNTER — Telehealth: Payer: Self-pay | Admitting: Endocrinology

## 2016-08-12 NOTE — Telephone Encounter (Signed)
William Chase called back and he doesn't understand why Dr. Loanne Drilling needs to see him before he can have an xray.  He is not happy about it, and would like a call back.

## 2016-08-12 NOTE — Telephone Encounter (Signed)
Called patient and left message advising patient that he needs an office visit with Dr.Ellison, or our [facility] office for a Saturday visit before they could put in a referral for an xray. Gave patient number to office to call if he wanted to make an appointment to be seen.
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
## 2016-08-12 NOTE — Telephone Encounter (Signed)
Ov would be needed to consider this.  Sat at [place] is an option, too

## 2016-08-12 NOTE — Telephone Encounter (Signed)
You can self-refer to someone in your area

## 2016-08-12 NOTE — Telephone Encounter (Signed)
Called and spoke with patient, after explaining to him that [facility] would need to see him before putting in an xray..or he could see the [facility] office on a Saturday. Patient requested the number to the office which I gave to him. He states if he has to cancel his other appointment until he sees Dr.Ellison he would do that.

## 2016-08-12 NOTE — Telephone Encounter (Signed)
Called patient, he is requesting a referral for an xray of his hip and back. I notified MD and will go from there.

## 2016-08-16 ENCOUNTER — Other Ambulatory Visit: Payer: Self-pay | Admitting: Endocrinology

## 2016-08-17 ENCOUNTER — Ambulatory Visit (INDEPENDENT_AMBULATORY_CARE_PROVIDER_SITE_OTHER): Payer: Medicare Other | Admitting: Endocrinology

## 2016-08-17 ENCOUNTER — Ambulatory Visit (INDEPENDENT_AMBULATORY_CARE_PROVIDER_SITE_OTHER)
Admission: RE | Admit: 2016-08-17 | Discharge: 2016-08-17 | Disposition: A | Discharge status: Home / Self Care | Payer: Medicare Other | Source: Ambulatory Visit | Attending: Endocrinology | Admitting: Endocrinology

## 2016-08-17 ENCOUNTER — Encounter: Payer: Self-pay | Admitting: Endocrinology

## 2016-08-17 VITALS — BP 136/82 | HR 79 | Ht 69.0 in | Wt 216.0 lb

## 2016-08-17 DIAGNOSIS — M542 Cervicalgia: Secondary | ICD-10-CM | POA: Diagnosis not present

## 2016-08-17 DIAGNOSIS — Z23 Encounter for immunization: Secondary | ICD-10-CM

## 2016-08-17 DIAGNOSIS — M25559 Pain in unspecified hip: Secondary | ICD-10-CM | POA: Diagnosis not present

## 2016-08-17 DIAGNOSIS — M545 Low back pain, unspecified: Secondary | ICD-10-CM

## 2016-08-17 DIAGNOSIS — Z0000 Encounter for general adult medical examination without abnormal findings: Secondary | ICD-10-CM | POA: Diagnosis not present

## 2016-08-17 DIAGNOSIS — G8929 Other chronic pain: Secondary | ICD-10-CM

## 2016-08-17 DIAGNOSIS — S3993XA Unspecified injury of pelvis, initial encounter: Secondary | ICD-10-CM | POA: Diagnosis not present

## 2016-08-17 DIAGNOSIS — S299XXA Unspecified injury of thorax, initial encounter: Secondary | ICD-10-CM | POA: Diagnosis not present

## 2016-08-17 NOTE — Patient Instructions (Addendum)
X-rays are requested for you today.  We'll let you know about the results. Please continue the same medications. good diet and exercise significantly improve your health.  please let me know if you wish to be referred to a dietician.  high blood sugar is very risky to your health.  you should see an eye doctor and dentist every year.  It is very important to get all recommended vaccinations.  Please consider these measures for your health:  minimize alcohol.  Do not use tobacco products.  Have a colonoscopy at least every 10 years from age 57.  Women should have an annual mammogram from age 37.  Keep firearms safely stored.  Always use seat belts.  have working smoke alarms in your home.  See an eye doctor and dentist regularly.  Never drive under the influence of alcohol or drugs (including prescription drugs).  Those with fair skin should take precautions against the sun, and should carefully examine their skin once per month, for any new or changed moles. It is critically important to prevent falling down (keep floor areas well-lit, dry, and free of loose objects.  If you have a cane, walker, or wheelchair, you should use it, even for short trips around the house.  Wear flat-soled shoes.  Also, try not to rush) I'll see you next time.

## 2016-08-17 NOTE — Progress Notes (Signed)
we discussed code status.  pt requests full code, but would not want to be started or maintained on artificial life-support measures if there was not a reasonable chance of recovery 
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
## 2016-09-14 ENCOUNTER — Other Ambulatory Visit: Payer: Self-pay | Admitting: Endocrinology

## 2016-10-07 ENCOUNTER — Ambulatory Visit (INDEPENDENT_AMBULATORY_CARE_PROVIDER_SITE_OTHER): Payer: Medicare Other | Admitting: Endocrinology

## 2016-10-07 ENCOUNTER — Encounter: Payer: Self-pay | Admitting: Endocrinology

## 2016-10-07 VITALS — BP 124/70 | HR 60 | Ht 69.0 in | Wt 213.0 lb

## 2016-10-07 DIAGNOSIS — K769 Liver disease, unspecified: Secondary | ICD-10-CM

## 2016-10-07 DIAGNOSIS — E785 Hyperlipidemia, unspecified: Secondary | ICD-10-CM | POA: Diagnosis not present

## 2016-10-07 DIAGNOSIS — E119 Type 2 diabetes mellitus without complications: Secondary | ICD-10-CM | POA: Diagnosis not present

## 2016-10-07 DIAGNOSIS — I10 Essential (primary) hypertension: Secondary | ICD-10-CM

## 2016-10-07 DIAGNOSIS — R011 Cardiac murmur, unspecified: Secondary | ICD-10-CM | POA: Insufficient documentation

## 2016-10-07 DIAGNOSIS — R413 Other amnesia: Secondary | ICD-10-CM

## 2016-10-07 DIAGNOSIS — Z0000 Encounter for general adult medical examination without abnormal findings: Secondary | ICD-10-CM | POA: Diagnosis not present

## 2016-10-07 LAB — CBC WITH DIFFERENTIAL/PLATELET
BASOS ABS: 0 10*3/uL (ref 0.0–0.1)
Basophils Relative: 0.5 % (ref 0.0–3.0)
EOS ABS: 0.1 10*3/uL (ref 0.0–0.7)
Eosinophils Relative: 0.9 % (ref 0.0–5.0)
HEMATOCRIT: 43.1 % (ref 39.0–52.0)
HEMOGLOBIN: 14.3 g/dL (ref 13.0–17.0)
LYMPHS PCT: 24.7 % (ref 12.0–46.0)
Lymphs Abs: 2.1 10*3/uL (ref 0.7–4.0)
MCHC: 33.2 g/dL (ref 30.0–36.0)
MCV: 85.2 fl (ref 78.0–100.0)
Monocytes Absolute: 0.8 10*3/uL (ref 0.1–1.0)
Monocytes Relative: 9 % (ref 3.0–12.0)
NEUTROS ABS: 5.5 10*3/uL (ref 1.4–7.7)
Neutrophils Relative %: 64.9 % (ref 43.0–77.0)
PLATELETS: 299 10*3/uL (ref 150.0–400.0)
RBC: 5.06 Mil/uL (ref 4.22–5.81)
RDW: 13.1 % (ref 11.5–15.5)
WBC: 8.4 10*3/uL (ref 4.0–10.5)

## 2016-10-07 LAB — BASIC METABOLIC PANEL
BUN: 15 mg/dL (ref 6–23)
CHLORIDE: 102 meq/L (ref 96–112)
CO2: 30 mEq/L (ref 19–32)
Calcium: 9.7 mg/dL (ref 8.4–10.5)
Creatinine, Ser: 0.91 mg/dL (ref 0.40–1.50)
GFR: 94.53 mL/min (ref >60.00)
Glucose, Bld: 107 mg/dL — ABNORMAL HIGH (ref 70–99)
POTASSIUM: 4.9 meq/L (ref 3.5–5.1)
Sodium: 138 mEq/L (ref 135–145)

## 2016-10-07 LAB — LIPID PANEL
CHOL/HDL RATIO: 2
Cholesterol: 135 mg/dL (ref 0–200)
HDL: 54.8 mg/dL (ref >39.00)
LDL Cholesterol: 64 mg/dL (ref 0–99)
NONHDL: 80.12
TRIGLYCERIDES: 80 mg/dL (ref 0.0–149.0)
VLDL: 16 mg/dL (ref 0.0–40.0)

## 2016-10-07 LAB — HEPATIC FUNCTION PANEL
ALBUMIN: 4.5 g/dL (ref 3.5–5.2)
ALT: 21 U/L (ref 0–53)
AST: 25 U/L (ref 0–37)
Alkaline Phosphatase: 85 U/L (ref 39–117)
Bilirubin, Direct: 0.1 mg/dL (ref 0.0–0.3)
TOTAL PROTEIN: 7.5 g/dL (ref 6.0–8.3)
Total Bilirubin: 0.2 mg/dL (ref 0.2–1.2)

## 2016-10-07 LAB — URINALYSIS, ROUTINE W REFLEX MICROSCOPIC
BILIRUBIN URINE: NEGATIVE
HGB URINE DIPSTICK: NEGATIVE
KETONES UR: NEGATIVE
LEUKOCYTES UA: NEGATIVE
NITRITE: NEGATIVE
PH: 5.5 (ref 5.0–8.0)
Specific Gravity, Urine: 1.025 (ref 1.000–1.030)
Total Protein, Urine: NEGATIVE
UROBILINOGEN UA: 0.2 (ref 0.0–1.0)
Urine Glucose: NEGATIVE

## 2016-10-07 LAB — HIV ANTIBODY (ROUTINE TESTING W REFLEX): HIV 1&2 Ab, 4th Generation: NONREACTIVE

## 2016-10-07 LAB — TSH: TSH: 1.72 u[IU]/mL (ref 0.35–4.50)

## 2016-10-07 LAB — HEMOGLOBIN A1C: HEMOGLOBIN A1C: 5.4 % (ref 4.6–6.5)

## 2016-10-07 NOTE — Patient Instructions (Signed)
Please consider these measures for your health:  minimize alcohol.  Do not use tobacco products.  Have a colonoscopy at least every 10 years from age 48.  Women should have an annual mammogram from age 40.  Keep firearms safely stored.  Always use seat belts.  have working smoke alarms in your home.  See an eye doctor and dentist regularly.  Never drive under the influence of alcohol or drugs (including prescription drugs).  Those with fair skin should take precautions against the sun, and should carefully examine their skin once per month, for any new or changed moles. blood tests are requested for you today.  We'll let you know about the results.   Please return in 1 year.  
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
## 2016-10-11 ENCOUNTER — Telehealth: Payer: Self-pay | Admitting: Endocrinology

## 2016-10-11 NOTE — Telephone Encounter (Signed)
Patient stated he got a call from Loanne Drilling this morning about getting a [facility] cardiogram, he said he will not do it.

## 2016-10-11 NOTE — Telephone Encounter (Signed)
See message to be advised.  

## 2016-10-13 NOTE — Telephone Encounter (Signed)
Pt called back in and said that he is needing to know why Dr. Loanne Drilling is now requesting him to have this Echo Cardiogram done because he was under the impression that everything is fine.  Please advise.

## 2016-10-13 NOTE — Telephone Encounter (Signed)
I contacted the patient and advised Dr. Loanne Drilling wanted to have the Echo completed due to the heart murmer present on his recent EKG from 10/07/2016. Patient voiced understanding and stated he did remember Dr. Loanne Drilling mentioning the murmer being present. Patient agreed to the Echo.  Neoma Laming,  Please schedule when you can. Thanks!

## 2016-10-15 ENCOUNTER — Other Ambulatory Visit: Payer: Self-pay | Admitting: Endocrinology

## 2016-11-08 NOTE — Telephone Encounter (Signed)
Pt is awaiting the echo to be scheduled he is ok with doing the Echo, I see there is an order in the system.

## 2016-11-14 ENCOUNTER — Other Ambulatory Visit: Payer: Self-pay | Admitting: Endocrinology

## 2016-11-16 DIAGNOSIS — M79662 Pain in left lower leg: Secondary | ICD-10-CM | POA: Diagnosis not present

## 2016-11-16 DIAGNOSIS — M47816 Spondylosis without myelopathy or radiculopathy, lumbar region: Secondary | ICD-10-CM | POA: Diagnosis not present

## 2016-11-16 DIAGNOSIS — G894 Chronic pain syndrome: Secondary | ICD-10-CM | POA: Diagnosis not present

## 2016-11-16 DIAGNOSIS — Z79891 Long term (current) use of opiate analgesic: Secondary | ICD-10-CM | POA: Diagnosis not present

## 2016-11-18 ENCOUNTER — Other Ambulatory Visit: Payer: Self-pay

## 2016-11-18 ENCOUNTER — Ambulatory Visit (HOSPITAL_COMMUNITY): Payer: Medicare Other | Attending: Endocrinology

## 2016-11-18 DIAGNOSIS — R011 Cardiac murmur, unspecified: Secondary | ICD-10-CM

## 2016-11-18 DIAGNOSIS — I348 Other nonrheumatic mitral valve disorders: Secondary | ICD-10-CM | POA: Diagnosis not present

## 2016-11-18 LAB — ECHOCARDIOGRAM COMPLETE
CHL CUP LV S' LATERAL: 12.6 cm/s
E decel time: 183 msec
E/e' ratio: 10.99
FS: 38 % (ref 28–44)
IV/PV OW: 1.7
LA ID, A-P, ES: 45 mm
LADIAMINDEX: 2.12 cm/m2
LAVOLA4C: 54 mL
LEFT ATRIUM END SYS DIAM: 45 mm
LV TDI E'MEDIAL: 5.26
LV e' LATERAL: 10.1 cm/s
LVEEAVG: 10.99
LVEEMED: 10.99
LVOT VTI: 37 cm
LVOT peak grad rest: 12 mmHg
LVOTPV: 174 cm/s
MV Dec: 183
MV Peak grad: 5 mmHg
MVPKAVEL: 85.9 m/s
MVPKEVEL: 111 m/s
PW: 9.87 mm — AB (ref 0.6–1.1)
TDI e' lateral: 10.1

## 2016-12-13 ENCOUNTER — Other Ambulatory Visit: Payer: Self-pay | Admitting: Endocrinology

## 2016-12-16 ENCOUNTER — Other Ambulatory Visit: Payer: Self-pay | Admitting: Endocrinology

## 2016-12-16 IMAGING — CR DG CHEST 2V
2 series · 2 of 2 positions shown · non-contrast
Comparison: 08/29/2014

CLINICAL DATA: Cough and chest congestion and shortness of breath.
Chest pain.

EXAM:
CHEST  2 VIEW

[view not recorded (1 of 2)]
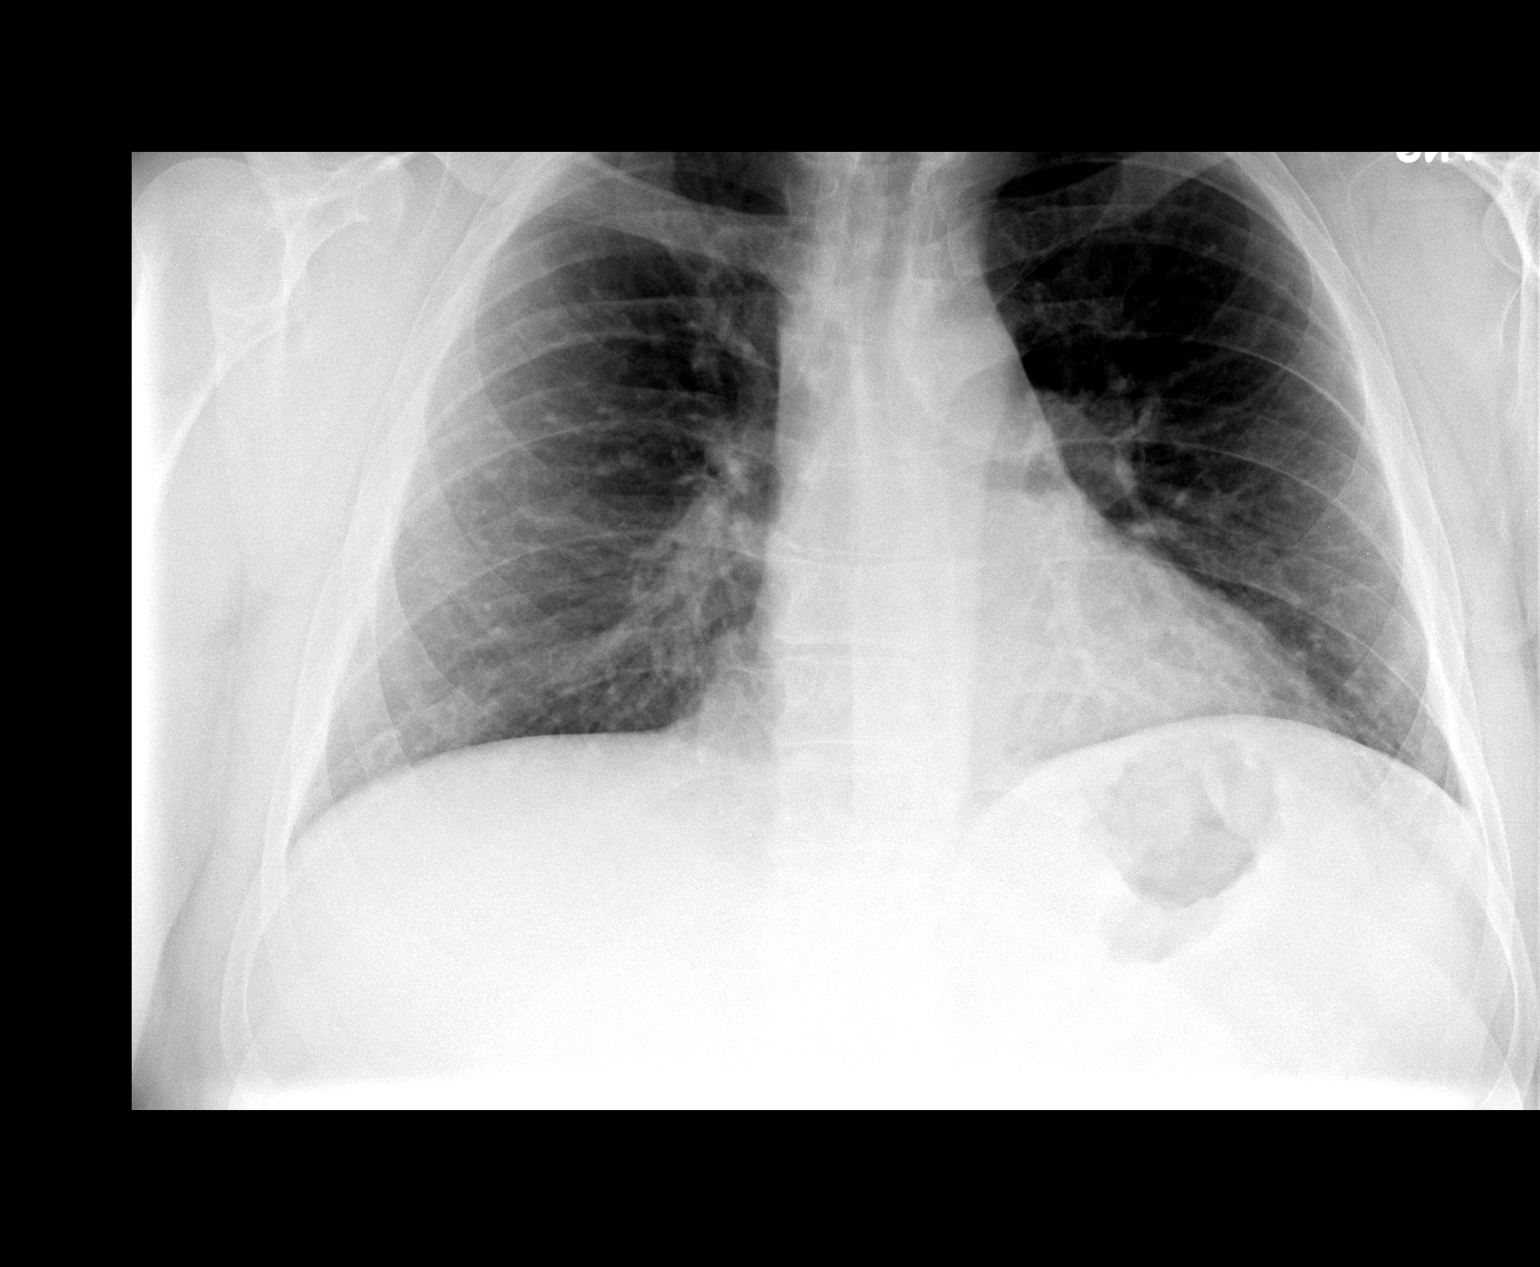

[view not recorded (2 of 2)]
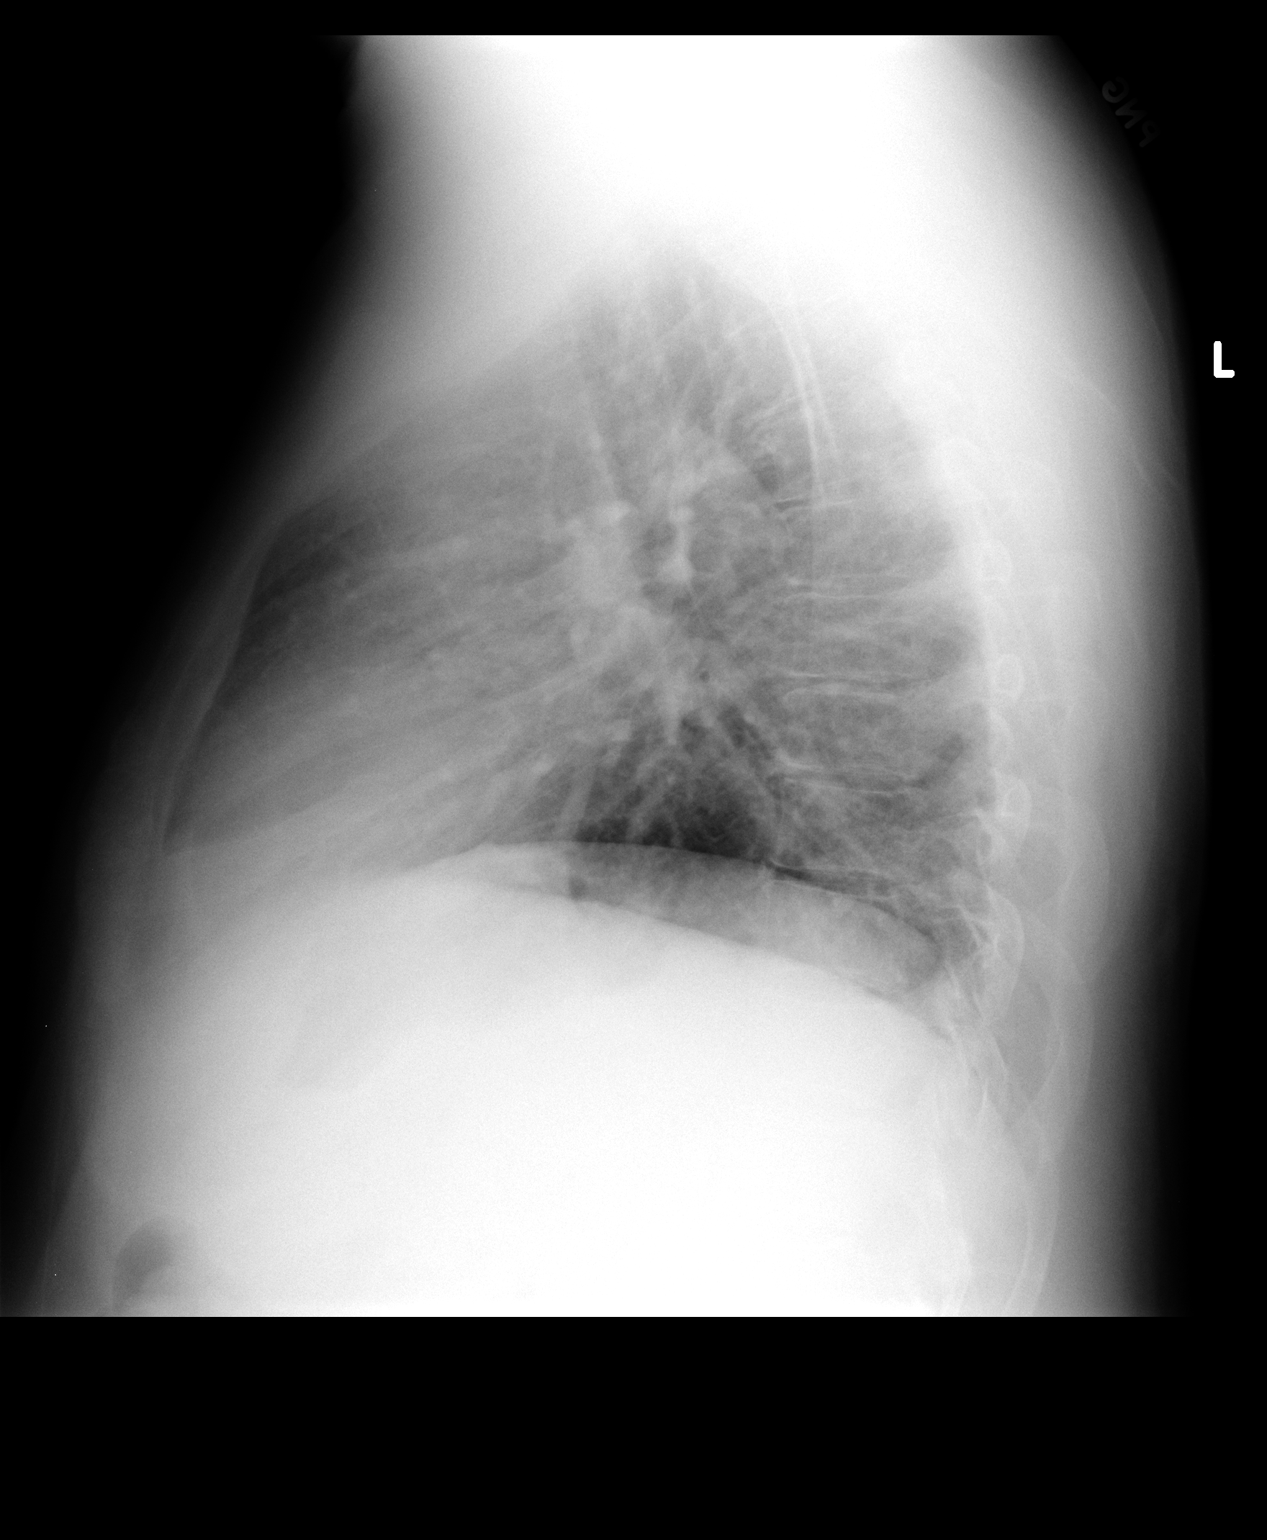

[2 of 2 positions shown; findings below may reference images not displayed]

FINDINGS: There has been complete clearing of the left lower lobe pneumonia.
Lungs are now clear except for slight peribronchial thickening.
Heart size and vascularity are normal. No effusions. No significant
osseous abnormality.
IMPRESSION: Slight bronchitic changes. Complete clearing of the left lower lobe
pneumonia.

## 2017-01-11 ENCOUNTER — Other Ambulatory Visit: Payer: Self-pay | Admitting: Endocrinology

## 2017-01-18 DIAGNOSIS — M47816 Spondylosis without myelopathy or radiculopathy, lumbar region: Secondary | ICD-10-CM | POA: Diagnosis not present

## 2017-01-18 DIAGNOSIS — G894 Chronic pain syndrome: Secondary | ICD-10-CM | POA: Diagnosis not present

## 2017-01-18 DIAGNOSIS — Z79891 Long term (current) use of opiate analgesic: Secondary | ICD-10-CM | POA: Diagnosis not present

## 2017-01-18 DIAGNOSIS — M79662 Pain in left lower leg: Secondary | ICD-10-CM | POA: Diagnosis not present

## 2017-02-15 ENCOUNTER — Other Ambulatory Visit: Payer: Self-pay | Admitting: Endocrinology

## 2017-03-10 ENCOUNTER — Encounter: Payer: Self-pay | Admitting: Endocrinology

## 2017-03-10 ENCOUNTER — Ambulatory Visit (INDEPENDENT_AMBULATORY_CARE_PROVIDER_SITE_OTHER): Payer: Medicare Other | Admitting: Endocrinology

## 2017-03-10 DIAGNOSIS — L259 Unspecified contact dermatitis, unspecified cause: Secondary | ICD-10-CM | POA: Diagnosis not present

## 2017-03-10 DIAGNOSIS — I517 Cardiomegaly: Secondary | ICD-10-CM | POA: Insufficient documentation

## 2017-03-10 MED ORDER — TRIAMCINOLONE ACETONIDE 0.1 % EX CREA: 1.0000 "application " | TOPICAL_CREAM | Freq: Four times a day (QID) | CUTANEOUS | 2 refills | Status: DC

## 2017-03-10 MED ORDER — DOXYCYCLINE HYCLATE 100 MG PO TABS: 100.0000 mg | ORAL_TABLET | Freq: Two times a day (BID) | ORAL | 0 refills | Status: DC

## 2017-03-10 NOTE — Patient Instructions (Addendum)
I have sent 2 prescriptions to your pharmacy for an antibiotic pill and skin cream. I hope you feel better soon.  If you don't feel better in 2 weeks, please call back.  Please call sooner if you get worse.
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
## 2017-03-11 DIAGNOSIS — L259 Unspecified contact dermatitis, unspecified cause: Secondary | ICD-10-CM | POA: Insufficient documentation

## 2017-03-15 DIAGNOSIS — G894 Chronic pain syndrome: Secondary | ICD-10-CM | POA: Diagnosis not present

## 2017-03-15 DIAGNOSIS — M47816 Spondylosis without myelopathy or radiculopathy, lumbar region: Secondary | ICD-10-CM | POA: Diagnosis not present

## 2017-03-15 DIAGNOSIS — M79662 Pain in left lower leg: Secondary | ICD-10-CM | POA: Diagnosis not present

## 2017-03-15 DIAGNOSIS — Z79891 Long term (current) use of opiate analgesic: Secondary | ICD-10-CM | POA: Diagnosis not present

## 2017-03-20 ENCOUNTER — Other Ambulatory Visit: Payer: Self-pay | Admitting: Endocrinology

## 2017-05-18 DIAGNOSIS — M47816 Spondylosis without myelopathy or radiculopathy, lumbar region: Secondary | ICD-10-CM | POA: Diagnosis not present

## 2017-05-18 DIAGNOSIS — Z79891 Long term (current) use of opiate analgesic: Secondary | ICD-10-CM | POA: Diagnosis not present

## 2017-05-18 DIAGNOSIS — G894 Chronic pain syndrome: Secondary | ICD-10-CM | POA: Diagnosis not present

## 2017-05-18 DIAGNOSIS — M79662 Pain in left lower leg: Secondary | ICD-10-CM | POA: Diagnosis not present

## 2017-05-19 ENCOUNTER — Other Ambulatory Visit: Payer: Self-pay | Admitting: Endocrinology

## 2017-05-22 ENCOUNTER — Telehealth: Payer: Self-pay | Admitting: Endocrinology

## 2017-05-22 NOTE — Telephone Encounter (Signed)
Patient advised if he had mychart he could try and send the pictures that way.

## 2017-05-22 NOTE — Telephone Encounter (Signed)
I contacted the patient. I advised Dr. Loanne Drilling is currently out of town and if he need immediate attention to seek care at a local urgent care. Patient voiced understanding and stated he would continue to take the kenalog cream to help.
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
## 2017-05-26 ENCOUNTER — Telehealth: Payer: Self-pay | Admitting: Endocrinology

## 2017-05-26 NOTE — Telephone Encounter (Signed)
Routing to you °

## 2017-05-26 NOTE — Telephone Encounter (Signed)
Called patient back & told him antibiotics can't be prescribed with out an OV. He said that he would call back next week or try to go see another provider at [facility] if condition worsened.
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
## 2017-05-26 NOTE — Telephone Encounter (Signed)
Called and patient made appointment at [facility] with [place] at 9:15. Pt stated that he wasn't sure if he could make that appt. & would call to cancel if he couldn't make the visit. I also told Pt that he should go to ER if felt he needed care beforehand.

## 2017-05-26 NOTE — Telephone Encounter (Signed)
Patient is requesting an antibiotic for the same situation (spider bite?). Patient thinks he needs an oral antibiotic. If possible send an oral antibiotic to  [place], [place] - [address] [phone] (Phone) [phone] (Fax)

## 2017-05-29 ENCOUNTER — Ambulatory Visit: Payer: Medicare Other | Admitting: Family Medicine
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
## 2017-06-02 ENCOUNTER — Emergency Department (INDEPENDENT_AMBULATORY_CARE_PROVIDER_SITE_OTHER)
Admission: EM | Admit: 2017-06-02 | Discharge: 2017-06-02 | Disposition: A | Discharge status: Home / Self Care | Payer: Medicare Other | Source: Home / Self Care | Attending: Family Medicine | Admitting: Family Medicine

## 2017-06-02 DIAGNOSIS — T07XXXA Unspecified multiple injuries, initial encounter: Secondary | ICD-10-CM

## 2017-06-02 MED ORDER — CEPHALEXIN 500 MG PO CAPS: 500.0000 mg | ORAL_CAPSULE | Freq: Two times a day (BID) | ORAL | 0 refills | Status: DC

## 2017-06-02 MED ORDER — MUPIROCIN 2 % EX OINT: TOPICAL_OINTMENT | CUTANEOUS | 0 refills | Status: DC
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
## 2017-06-02 NOTE — Discharge Instructions (Signed)
°  Keep wounds clean with warm water and soap.  You should only be using the oral antibiotic, cephalexin and the newly prescribed mupirocin ointment on the wounds from now on.    You should apply the antibiotic ointment and a non-stick guaze pad then guaze roll or paper tape to hold your bandages in place.  You may also get Xeroform non-adhesive wound guaze at the pharmacy to apply to the deeper, larger wounds to help with healing.  You should change the non-stick guaze pads twice daily or the Xeroform at least once daily.

## 2017-06-02 NOTE — Telephone Encounter (Signed)
Called and spoke with PT. Since Dr. Loanne Drilling isn't here & is booked he is going to an urgent care then will call back to make follow up appt.
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
## 2017-06-03 NOTE — Telephone Encounter (Signed)
Received a call from William Chase he was asking what he could [place] for a dressing in place of xeroform gauze. He was advised to ask the pharmist what would be comparable to the xeroform and to use that.

## 2017-06-20 ENCOUNTER — Other Ambulatory Visit: Payer: Self-pay | Admitting: Endocrinology

## 2017-07-19 DIAGNOSIS — M47816 Spondylosis without myelopathy or radiculopathy, lumbar region: Secondary | ICD-10-CM | POA: Diagnosis not present

## 2017-07-19 DIAGNOSIS — G894 Chronic pain syndrome: Secondary | ICD-10-CM | POA: Diagnosis not present

## 2017-07-19 DIAGNOSIS — Z79891 Long term (current) use of opiate analgesic: Secondary | ICD-10-CM | POA: Diagnosis not present

## 2017-07-19 DIAGNOSIS — M79662 Pain in left lower leg: Secondary | ICD-10-CM | POA: Diagnosis not present

## 2017-07-19 DIAGNOSIS — M47812 Spondylosis without myelopathy or radiculopathy, cervical region: Secondary | ICD-10-CM | POA: Diagnosis not present

## 2017-08-18 ENCOUNTER — Other Ambulatory Visit: Payer: Self-pay | Admitting: Endocrinology

## 2017-09-20 DIAGNOSIS — M47816 Spondylosis without myelopathy or radiculopathy, lumbar region: Secondary | ICD-10-CM | POA: Diagnosis not present

## 2017-09-20 DIAGNOSIS — G894 Chronic pain syndrome: Secondary | ICD-10-CM | POA: Diagnosis not present

## 2017-09-20 DIAGNOSIS — M79662 Pain in left lower leg: Secondary | ICD-10-CM | POA: Diagnosis not present

## 2017-09-20 DIAGNOSIS — Z79891 Long term (current) use of opiate analgesic: Secondary | ICD-10-CM | POA: Diagnosis not present

## 2017-09-23 ENCOUNTER — Other Ambulatory Visit: Payer: Self-pay | Admitting: Endocrinology

## 2017-10-09 ENCOUNTER — Encounter: Payer: Self-pay | Admitting: Endocrinology

## 2017-10-09 ENCOUNTER — Ambulatory Visit
Admission: RE | Admit: 2017-10-09 | Discharge: 2017-10-09 | Disposition: A | Discharge status: Home / Self Care | Payer: Medicare Other | Source: Ambulatory Visit | Attending: Endocrinology | Admitting: Endocrinology

## 2017-10-09 ENCOUNTER — Ambulatory Visit: Payer: Medicare Other | Admitting: Endocrinology

## 2017-10-09 VITALS — BP 132/74 | HR 98 | Ht 68.0 in | Wt 236.0 lb

## 2017-10-09 DIAGNOSIS — R0600 Dyspnea, unspecified: Secondary | ICD-10-CM

## 2017-10-09 DIAGNOSIS — R0602 Shortness of breath: Secondary | ICD-10-CM

## 2017-10-09 DIAGNOSIS — Z23 Encounter for immunization: Secondary | ICD-10-CM | POA: Diagnosis not present

## 2017-10-09 DIAGNOSIS — I10 Essential (primary) hypertension: Secondary | ICD-10-CM

## 2017-10-09 DIAGNOSIS — J984 Other disorders of lung: Secondary | ICD-10-CM | POA: Diagnosis not present

## 2017-10-09 DIAGNOSIS — Z0000 Encounter for general adult medical examination without abnormal findings: Secondary | ICD-10-CM

## 2017-10-09 DIAGNOSIS — E119 Type 2 diabetes mellitus without complications: Secondary | ICD-10-CM | POA: Diagnosis not present

## 2017-10-09 HISTORY — DX: Shortness of breath: R06.02

## 2017-10-09 LAB — CBC WITH DIFFERENTIAL/PLATELET
BASOS ABS: 0 10*3/uL (ref 0.0–0.1)
Basophils Relative: 0.4 % (ref 0.0–3.0)
Eosinophils Absolute: 0.1 10*3/uL (ref 0.0–0.7)
Eosinophils Relative: 0.9 % (ref 0.0–5.0)
HCT: 43.9 % (ref 39.0–52.0)
HEMOGLOBIN: 14.4 g/dL (ref 13.0–17.0)
LYMPHS ABS: 2.7 10*3/uL (ref 0.7–4.0)
Lymphocytes Relative: 32.7 % (ref 12.0–46.0)
MCHC: 32.7 g/dL (ref 30.0–36.0)
MCV: 86.1 fl (ref 78.0–100.0)
MONOS PCT: 10 % (ref 3.0–12.0)
Monocytes Absolute: 0.8 10*3/uL (ref 0.1–1.0)
Neutro Abs: 4.7 10*3/uL (ref 1.4–7.7)
Neutrophils Relative %: 56 % (ref 43.0–77.0)
Platelets: 299 10*3/uL (ref 150.0–400.0)
RBC: 5.1 Mil/uL (ref 4.22–5.81)
RDW: 13.8 % (ref 11.5–15.5)
WBC: 8.4 10*3/uL (ref 4.0–10.5)

## 2017-10-09 LAB — HEPATIC FUNCTION PANEL
ALK PHOS: 76 U/L (ref 39–117)
ALT: 19 U/L (ref 0–53)
AST: 22 U/L (ref 0–37)
Albumin: 4.4 g/dL (ref 3.5–5.2)
BILIRUBIN DIRECT: 0 mg/dL (ref 0.0–0.3)
BILIRUBIN TOTAL: 0.2 mg/dL (ref 0.2–1.2)
TOTAL PROTEIN: 7.6 g/dL (ref 6.0–8.3)

## 2017-10-09 LAB — URINALYSIS, ROUTINE W REFLEX MICROSCOPIC
Bilirubin Urine: NEGATIVE
Hgb urine dipstick: NEGATIVE
KETONES UR: NEGATIVE
LEUKOCYTES UA: NEGATIVE
Nitrite: NEGATIVE
PH: 5.5 (ref 5.0–8.0)
SPECIFIC GRAVITY, URINE: 1.025 (ref 1.000–1.030)
Total Protein, Urine: NEGATIVE
Urine Glucose: NEGATIVE
Urobilinogen, UA: 0.2 (ref 0.0–1.0)

## 2017-10-09 LAB — LIPID PANEL
CHOL/HDL RATIO: 2
Cholesterol: 137 mg/dL (ref 0–200)
HDL: 55.9 mg/dL (ref >39.00)
LDL CALC: 51 mg/dL (ref 0–99)
NONHDL: 81.09
Triglycerides: 148 mg/dL (ref 0.0–149.0)
VLDL: 29.6 mg/dL (ref 0.0–40.0)

## 2017-10-09 LAB — TSH: TSH: 0.84 u[IU]/mL (ref 0.35–4.50)

## 2017-10-09 LAB — BASIC METABOLIC PANEL
BUN: 14 mg/dL (ref 6–23)
CALCIUM: 9.5 mg/dL (ref 8.4–10.5)
CHLORIDE: 102 meq/L (ref 96–112)
CO2: 30 meq/L (ref 19–32)
CREATININE: 0.93 mg/dL (ref 0.40–1.50)
GFR: 91.8 mL/min (ref >60.00)
GLUCOSE: 100 mg/dL — AB (ref 70–99)
Potassium: 4.4 mEq/L (ref 3.5–5.1)
Sodium: 136 mEq/L (ref 135–145)

## 2017-10-09 LAB — HEMOGLOBIN A1C: Hgb A1c MFr Bld: 5.8 % (ref 4.6–6.5)

## 2017-10-09 LAB — MICROALBUMIN / CREATININE URINE RATIO
CREATININE, U: 111.7 mg/dL
MICROALB UR: 2.3 mg/dL — AB (ref 0.0–1.9)
Microalb Creat Ratio: 2 mg/g (ref 0.0–30.0)

## 2017-10-09 MED ORDER — TRIAMCINOLONE ACETONIDE 0.1 % EX CREA: 1.0000 "application " | TOPICAL_CREAM | Freq: Four times a day (QID) | CUTANEOUS | 2 refills | Status: DC

## 2017-10-09 MED ORDER — FLUTICASONE-SALMETEROL 100-50 MCG/DOSE IN AEPB: 1.0000 | INHALATION_SPRAY | Freq: Two times a day (BID) | RESPIRATORY_TRACT | 3 refills | Status: DC

## 2017-10-09 NOTE — Progress Notes (Signed)
we discussed code status.  pt requests full code, but would not want to be started or maintained on artificial life-support measures if there was not a reasonable chance of recovery 

## 2017-10-09 NOTE — Patient Instructions (Addendum)
blood tests are requested for you today.  We'll let you know about the results. Please consider these measures for your health:  minimize alcohol.  Do not use tobacco products.  Have a colonoscopy at least every 10 years from age 51.  Keep firearms safely stored.  Always use seat belts.  have working smoke alarms in your home.  See an eye doctor and dentist regularly.  Never drive under the influence of alcohol or drugs (including prescription drugs).  Those with fair skin should take precautions against the sun, and should carefully examine their skin once per month, for any new or changed moles.   It is critically important to prevent falling down (keep floor areas well-lit, dry, and free of loose objects.  If you have a cane, walker, or wheelchair, you should use it, even for short trips around the house.  Wear flat-soled shoes.  Also, try not to rush).   Please come back for a follow-up appointment in 1 year.
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
## 2017-11-15 ENCOUNTER — Other Ambulatory Visit: Payer: Self-pay | Admitting: Endocrinology

## 2017-11-23 DIAGNOSIS — G894 Chronic pain syndrome: Secondary | ICD-10-CM | POA: Diagnosis not present

## 2017-11-23 DIAGNOSIS — Z79891 Long term (current) use of opiate analgesic: Secondary | ICD-10-CM | POA: Diagnosis not present

## 2017-11-23 DIAGNOSIS — M47816 Spondylosis without myelopathy or radiculopathy, lumbar region: Secondary | ICD-10-CM | POA: Diagnosis not present

## 2017-11-23 DIAGNOSIS — M79662 Pain in left lower leg: Secondary | ICD-10-CM | POA: Diagnosis not present

## 2017-12-06 DIAGNOSIS — R69 Illness, unspecified: Secondary | ICD-10-CM | POA: Diagnosis not present

## 2017-12-13 DIAGNOSIS — R69 Illness, unspecified: Secondary | ICD-10-CM | POA: Diagnosis not present

## 2017-12-26 ENCOUNTER — Other Ambulatory Visit: Payer: Self-pay | Admitting: Endocrinology

## 2017-12-26 DIAGNOSIS — R69 Illness, unspecified: Secondary | ICD-10-CM | POA: Diagnosis not present

## 2018-01-11 ENCOUNTER — Other Ambulatory Visit: Payer: Self-pay | Admitting: Endocrinology

## 2018-01-18 DIAGNOSIS — Z79891 Long term (current) use of opiate analgesic: Secondary | ICD-10-CM | POA: Diagnosis not present

## 2018-01-18 DIAGNOSIS — M47816 Spondylosis without myelopathy or radiculopathy, lumbar region: Secondary | ICD-10-CM | POA: Diagnosis not present

## 2018-01-18 DIAGNOSIS — G894 Chronic pain syndrome: Secondary | ICD-10-CM | POA: Diagnosis not present

## 2018-01-18 DIAGNOSIS — M79662 Pain in left lower leg: Secondary | ICD-10-CM | POA: Diagnosis not present

## 2018-03-05 DIAGNOSIS — R69 Illness, unspecified: Secondary | ICD-10-CM | POA: Diagnosis not present

## 2018-03-08 DIAGNOSIS — G894 Chronic pain syndrome: Secondary | ICD-10-CM | POA: Diagnosis not present

## 2018-03-08 DIAGNOSIS — M79662 Pain in left lower leg: Secondary | ICD-10-CM | POA: Diagnosis not present

## 2018-03-08 DIAGNOSIS — Z79891 Long term (current) use of opiate analgesic: Secondary | ICD-10-CM | POA: Diagnosis not present

## 2018-03-08 DIAGNOSIS — M47816 Spondylosis without myelopathy or radiculopathy, lumbar region: Secondary | ICD-10-CM | POA: Diagnosis not present

## 2018-03-25 ENCOUNTER — Other Ambulatory Visit: Payer: Self-pay | Admitting: Endocrinology

## 2018-04-18 DIAGNOSIS — Z79891 Long term (current) use of opiate analgesic: Secondary | ICD-10-CM | POA: Diagnosis not present

## 2018-04-18 DIAGNOSIS — M79662 Pain in left lower leg: Secondary | ICD-10-CM | POA: Diagnosis not present

## 2018-04-18 DIAGNOSIS — M47816 Spondylosis without myelopathy or radiculopathy, lumbar region: Secondary | ICD-10-CM | POA: Diagnosis not present

## 2018-04-18 DIAGNOSIS — G894 Chronic pain syndrome: Secondary | ICD-10-CM | POA: Diagnosis not present

## 2018-04-20 ENCOUNTER — Ambulatory Visit
Admission: RE | Admit: 2018-04-20 | Discharge: 2018-04-20 | Disposition: A | Discharge status: Home / Self Care | Payer: Medicare HMO | Source: Ambulatory Visit | Attending: Anesthesiology | Admitting: Anesthesiology

## 2018-04-20 ENCOUNTER — Other Ambulatory Visit: Payer: Self-pay | Admitting: Anesthesiology

## 2018-04-20 DIAGNOSIS — M47816 Spondylosis without myelopathy or radiculopathy, lumbar region: Secondary | ICD-10-CM | POA: Diagnosis not present

## 2018-04-20 DIAGNOSIS — W19XXXA Unspecified fall, initial encounter: Secondary | ICD-10-CM

## 2018-05-16 DIAGNOSIS — M79662 Pain in left lower leg: Secondary | ICD-10-CM | POA: Diagnosis not present

## 2018-05-16 DIAGNOSIS — Z79891 Long term (current) use of opiate analgesic: Secondary | ICD-10-CM | POA: Diagnosis not present

## 2018-05-16 DIAGNOSIS — G894 Chronic pain syndrome: Secondary | ICD-10-CM | POA: Diagnosis not present

## 2018-05-16 DIAGNOSIS — M47816 Spondylosis without myelopathy or radiculopathy, lumbar region: Secondary | ICD-10-CM | POA: Diagnosis not present

## 2018-05-16 DIAGNOSIS — R69 Illness, unspecified: Secondary | ICD-10-CM | POA: Diagnosis not present

## 2018-06-18 DIAGNOSIS — M47816 Spondylosis without myelopathy or radiculopathy, lumbar region: Secondary | ICD-10-CM | POA: Diagnosis not present

## 2018-07-08 ENCOUNTER — Other Ambulatory Visit: Payer: Self-pay | Admitting: Endocrinology

## 2018-07-15 ENCOUNTER — Other Ambulatory Visit: Payer: Self-pay | Admitting: Endocrinology

## 2018-07-18 ENCOUNTER — Ambulatory Visit (INDEPENDENT_AMBULATORY_CARE_PROVIDER_SITE_OTHER): Payer: Medicare HMO | Admitting: Nurse Practitioner

## 2018-07-18 ENCOUNTER — Encounter: Payer: Self-pay | Admitting: Nurse Practitioner

## 2018-07-18 VITALS — BP 114/60 | HR 95 | Ht 68.0 in | Wt 218.0 lb

## 2018-07-18 DIAGNOSIS — Z79891 Long term (current) use of opiate analgesic: Secondary | ICD-10-CM | POA: Diagnosis not present

## 2018-07-18 DIAGNOSIS — Z23 Encounter for immunization: Secondary | ICD-10-CM

## 2018-07-18 DIAGNOSIS — E785 Hyperlipidemia, unspecified: Secondary | ICD-10-CM

## 2018-07-18 DIAGNOSIS — I10 Essential (primary) hypertension: Secondary | ICD-10-CM

## 2018-07-18 DIAGNOSIS — L309 Dermatitis, unspecified: Secondary | ICD-10-CM

## 2018-07-18 DIAGNOSIS — M79662 Pain in left lower leg: Secondary | ICD-10-CM | POA: Diagnosis not present

## 2018-07-18 DIAGNOSIS — M47816 Spondylosis without myelopathy or radiculopathy, lumbar region: Secondary | ICD-10-CM | POA: Diagnosis not present

## 2018-07-18 DIAGNOSIS — G894 Chronic pain syndrome: Secondary | ICD-10-CM | POA: Diagnosis not present

## 2018-07-18 MED ORDER — FLUOCINOLONE ACETONIDE BODY 0.01 % EX OIL: 1.0000 "application " | TOPICAL_OIL | Freq: Three times a day (TID) | CUTANEOUS | 0 refills | Status: DC
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
## 2018-07-18 NOTE — Patient Instructions (Addendum)
I have sent a prescription for derma smoothe oil three times a day to your [facility]. Please follow up if no improvement with this treatment  I will see you back in December for your annual physical with fasting lab work, or sooner if needed   Eczema Eczema is a broad term for a group of skin conditions that cause skin to become rough and inflamed. Each type of eczema has different triggers, symptoms, and treatments. Eczema of any type is usually itchy and symptoms range from mild to severe. Eczema and its symptoms are not spread from person to person (are not contagious). It can appear on different parts of the body at different times. Your eczema may not look the same as someone else's eczema. What are the types of eczema? Atopic dermatitis This is a long-term (chronic) skin disease that keeps coming back (recurring). Usual symptoms are dry skin and small, solid pimples that may swell and leak fluid (weep). Contact dermatitis This happens when something irritates the skin and causes a rash. The irritation can come from substances that you are allergic to (allergens), such as poison ivy, chemicals, or medicines that were applied to your skin. Dyshidrotic eczema This is a form of eczema on the hands and feet. It shows up as very itchy, fluid-filled blisters. It can affect people of any age, but is more common before age 61. Hand eczema This causes very itchy areas of skin on the palms and sides of the hands and fingers. This type of eczema is common in industrial jobs where you may be exposed to many different types of irritants. Lichen simplex chronicus This type of eczema occurs when a person constantly scratches one area of the body. Repeated scratching of the area leads to thickened skin (lichenification). Lichen simplex chronicus can occur along with other types of eczema. It is more common in adults, but may be seen in children as well. Nummular eczema This is a common type of eczema.  It has no known cause. It typically causes a red, circular, crusty lesion (plaque) that may be itchy. Scratching may become a habit and can cause bleeding. Nummular eczema occurs most often in people of middle-age or older. It most often affects the hands. Seborrheic dermatitis This is a common skin disease that mainly affects the scalp. It may also affect any oily areas of the body, such as the face, sides of nose, eyebrows, ears, eyelids, and chest. It is marked by small scaling and redness of the skin (erythema). This can affect people of all ages. In infants, this condition is known as Chartered certified accountant." Stasis dermatitis This is a common skin disease that usually appears on the legs and feet. It most often occurs in people who have a condition that prevents blood from being pumped through the veins in the legs (chronic venous insufficiency). Stasis dermatitis is a chronic condition that needs long-term management. How is eczema diagnosed? Your health care provider will examine your skin and review your medical history. He or she may also give you skin patch tests. These tests involve taking patches that contain possible allergens and placing them on your back. He or she will then check in a few days to see if an allergic reaction occurred. What are the common treatments? Treatment for eczema is based on the type of eczema you have. Hydrocortisone steroid medicine can relieve itching quickly and help reduce inflammation. This medicine may be prescribed or obtained over-the-counter, depending on the strength of the medicine  that is needed. Follow these instructions at home:  Take over-the-counter and prescription medicines only as told by your health care provider.  Use creams or ointments to moisturize your skin. Do not use lotions.  Learn what triggers or irritates your symptoms. Avoid these things.  Treat symptom flare-ups quickly.  Do not itch your skin. This can make your rash worse.  Keep  all follow-up visits as told by your health care provider. This is important. Where to find more information:  The American Academy of Dermatology: http://jones-macias.info/  The National Eczema Association: www.nationaleczema.org Contact a health care provider if:  You have serious itching, even with treatment.  You regularly scratch your skin until it bleeds.  Your rash looks different than usual.  Your skin is painful, swollen, or more red than usual.  You have a fever. Summary  There are eight general types of eczema. Each type has different triggers.  Eczema of any type causes itching that may range from mild to severe.  Treatment varies based on the type of eczema you have. Hydrocortisone steroid medicine can help with itching and inflammation.  Protecting your skin is the best way to prevent eczema. Use moisturizers and lotions. Avoid triggers and irritants, and treat flare-ups quickly. This information is not intended to replace advice given to you by your health care provider. Make sure you discuss any questions you have with your health care provider. Document Released: 02/23/2017 Document Revised: 02/23/2017 Document Reviewed: 02/23/2017 Elsevier Interactive Patient Education  2018 [employer]. Trial

## 2018-07-18 NOTE — Assessment & Plan Note (Signed)
Continue simvastatin Labs are up to date RTC for CPE/annual labs in december

## 2018-07-18 NOTE — Assessment & Plan Note (Signed)
Stable Continue current meds Labs are up to date Continue to monitor

## 2018-08-11 ENCOUNTER — Other Ambulatory Visit: Payer: Self-pay | Admitting: Endocrinology

## 2018-08-14 ENCOUNTER — Other Ambulatory Visit: Payer: Self-pay | Admitting: Endocrinology

## 2018-08-15 ENCOUNTER — Other Ambulatory Visit: Payer: Self-pay | Admitting: Endocrinology

## 2018-08-15 DIAGNOSIS — R69 Illness, unspecified: Secondary | ICD-10-CM | POA: Diagnosis not present

## 2018-09-19 ENCOUNTER — Other Ambulatory Visit: Payer: Self-pay | Admitting: Anesthesiology

## 2018-09-19 DIAGNOSIS — G894 Chronic pain syndrome: Secondary | ICD-10-CM | POA: Diagnosis not present

## 2018-09-19 DIAGNOSIS — M79662 Pain in left lower leg: Secondary | ICD-10-CM | POA: Diagnosis not present

## 2018-09-19 DIAGNOSIS — H524 Presbyopia: Secondary | ICD-10-CM | POA: Diagnosis not present

## 2018-09-19 DIAGNOSIS — M47816 Spondylosis without myelopathy or radiculopathy, lumbar region: Secondary | ICD-10-CM

## 2018-09-19 DIAGNOSIS — Z79891 Long term (current) use of opiate analgesic: Secondary | ICD-10-CM | POA: Diagnosis not present

## 2018-10-01 ENCOUNTER — Other Ambulatory Visit: Payer: Self-pay | Admitting: Endocrinology

## 2018-10-02 NOTE — Telephone Encounter (Signed)
Please refer request to new PCP  

## 2018-10-09 ENCOUNTER — Ambulatory Visit: Payer: Medicare Other | Admitting: Endocrinology

## 2018-10-10 ENCOUNTER — Ambulatory Visit (INDEPENDENT_AMBULATORY_CARE_PROVIDER_SITE_OTHER): Payer: Medicare HMO | Admitting: Nurse Practitioner

## 2018-10-10 ENCOUNTER — Ambulatory Visit
Admission: RE | Admit: 2018-10-10 | Discharge: 2018-10-10 | Disposition: A | Discharge status: Home / Self Care | Payer: Medicare HMO | Source: Ambulatory Visit | Attending: Anesthesiology | Admitting: Anesthesiology

## 2018-10-10 ENCOUNTER — Encounter: Payer: Self-pay | Admitting: Nurse Practitioner

## 2018-10-10 VITALS — BP 110/62 | HR 84 | Ht 68.0 in | Wt 232.0 lb

## 2018-10-10 DIAGNOSIS — I10 Essential (primary) hypertension: Secondary | ICD-10-CM | POA: Diagnosis not present

## 2018-10-10 DIAGNOSIS — F419 Anxiety disorder, unspecified: Secondary | ICD-10-CM | POA: Insufficient documentation

## 2018-10-10 DIAGNOSIS — F39 Unspecified mood [affective] disorder: Secondary | ICD-10-CM | POA: Insufficient documentation

## 2018-10-10 DIAGNOSIS — R011 Cardiac murmur, unspecified: Secondary | ICD-10-CM

## 2018-10-10 DIAGNOSIS — Z0001 Encounter for general adult medical examination with abnormal findings: Secondary | ICD-10-CM

## 2018-10-10 DIAGNOSIS — F32A Depression, unspecified: Secondary | ICD-10-CM | POA: Insufficient documentation

## 2018-10-10 DIAGNOSIS — Z125 Encounter for screening for malignant neoplasm of prostate: Secondary | ICD-10-CM | POA: Diagnosis not present

## 2018-10-10 DIAGNOSIS — E785 Hyperlipidemia, unspecified: Secondary | ICD-10-CM

## 2018-10-10 DIAGNOSIS — M5136 Other intervertebral disc degeneration, lumbar region: Secondary | ICD-10-CM | POA: Diagnosis not present

## 2018-10-10 DIAGNOSIS — M47816 Spondylosis without myelopathy or radiculopathy, lumbar region: Secondary | ICD-10-CM

## 2018-10-10 DIAGNOSIS — F329 Major depressive disorder, single episode, unspecified: Secondary | ICD-10-CM

## 2018-10-10 DIAGNOSIS — R7303 Prediabetes: Secondary | ICD-10-CM

## 2018-10-10 DIAGNOSIS — R69 Illness, unspecified: Secondary | ICD-10-CM | POA: Diagnosis not present

## 2018-10-10 NOTE — Patient Instructions (Signed)
Please return to the lab downstairs for labwork when fasting- only water or black coffee for 8 hours prior  I have placed a referral to cardiology for your heart murmur   Health Maintenance, Male A healthy lifestyle and preventive care is important for your health and wellness. Ask your health care provider about what schedule of regular examinations is right for you. What should I know about weight and diet? Eat a Healthy Diet  Eat plenty of vegetables, fruits, whole grains, low-fat dairy products, and lean protein.  Do not eat a lot of foods high in solid fats, added sugars, or salt.  Maintain a Healthy Weight Regular exercise can help you achieve or maintain a healthy weight. You should:  Do at least 150 minutes of exercise each week. The exercise should increase your heart rate and make you sweat (moderate-intensity exercise).  Do strength-training exercises at least twice a week. Watch Your Levels of Cholesterol and Blood Lipids  Have your blood tested for lipids and cholesterol every 5 years starting at 50 years of age. If you are at high risk for heart disease, you should start having your blood tested when you are 50 years old. You may need to have your cholesterol levels checked more often if: ? Your lipid or cholesterol levels are high. ? You are older than 50 years of age. ? You are at high risk for heart disease. What should I know about cancer screening? Many types of cancers can be detected early and may often be prevented. Lung Cancer  You should be screened every year for lung cancer if: ? You are a current smoker who has smoked for at least 30 years. ? You are a former smoker who has quit within the past 15 years.  Talk to your health care provider about your screening options, when you should start screening, and how often you should be screened. Colorectal Cancer  Routine colorectal cancer screening usually begins at 50 years of age and should be repeated every  5-10 years until you are 50 years old. You may need to be screened more often if early forms of precancerous polyps or small growths are found. Your health care provider may recommend screening at an earlier age if you have risk factors for colon cancer.  Your health care provider may recommend using home test kits to check for hidden blood in the stool.  A small camera at the end of a tube can be used to examine your colon (sigmoidoscopy or colonoscopy). This checks for the earliest forms of colorectal cancer. Prostate and Testicular Cancer  Depending on your age and overall health, your health care provider may do certain tests to screen for prostate and testicular cancer.  Talk to your health care provider about any symptoms or concerns you have about testicular or prostate cancer. Skin Cancer  Check your skin from head to toe regularly.  Tell your health care provider about any new moles or changes in moles, especially if: ? There is a change in a mole's size, shape, or color. ? You have a mole that is larger than a pencil eraser.  Always use sunscreen. Apply sunscreen liberally and repeat throughout the day.  Protect yourself by wearing long sleeves, pants, a wide-brimmed hat, and sunglasses when outside. What should I know about heart disease, diabetes, and high blood pressure?  If you are 52-49 years of age, have your blood pressure checked every 3-5 years. If you are 16 years of age or  older, have your blood pressure checked every year. You should have your blood pressure measured twice-once when you are at a hospital or clinic, and once when you are not at a hospital or clinic. Record the average of the two measurements. To check your blood pressure when you are not at a hospital or clinic, you can use: ? An automated blood pressure machine at a pharmacy. ? A home blood pressure monitor.  Talk to your health care provider about your target blood pressure.  If you are between  33-58 years old, ask your health care provider if you should take aspirin to prevent heart disease.  Have regular diabetes screenings by checking your fasting blood sugar level. ? If you are at a normal weight and have a low risk for diabetes, have this test once every three years after the age of 10. ? If you are overweight and have a high risk for diabetes, consider being tested at a younger age or more often.  A one-time screening for abdominal aortic aneurysm (AAA) by ultrasound is recommended for men aged 52-75 years who are current or former smokers. What should I know about preventing infection? Hepatitis B If you have a higher risk for hepatitis B, you should be screened for this virus. Talk with your health care provider to find out if you are at risk for hepatitis B infection. Hepatitis C Blood testing is recommended for:  Everyone born from [birth year] through 1963.  Anyone with known risk factors for hepatitis C. Sexually Transmitted Diseases (STDs)  You should be screened each year for STDs including gonorrhea and chlamydia if: ? You are sexually active and are younger than 50 years of age. ? You are older than 50 years of age and your health care provider tells you that you are at risk for this type of infection. ? Your sexual activity has changed since you were last screened and you are at an increased risk for chlamydia or gonorrhea. Ask your health care provider if you are at risk.  Talk with your health care provider about whether you are at high risk of being infected with HIV. Your health care provider may recommend a prescription medicine to help prevent HIV infection. What else can I do?  Schedule regular health, dental, and eye exams.  Stay current with your vaccines (immunizations).  Do not use any tobacco products, such as cigarettes, chewing tobacco, and e-cigarettes. If you need help quitting, ask your health care provider.  Limit alcohol intake to no more than 2  drinks per day. One drink equals 12 ounces of beer, 5 ounces of wine, or 1 ounces of hard liquor.  Do not use street drugs.  Do not share needles.  Ask your health care provider for help if you need support or information about quitting drugs.  Tell your health care provider if you often feel depressed.  Tell your health care provider if you have ever been abused or do not feel safe at home. This information is not intended to replace advice given to you by your health care provider. Make sure you discuss any questions you have with your health care provider. Document Released: 04/07/2008 Document Revised: 06/08/2016 Document Reviewed: 07/14/2015 Elsevier Interactive Patient Education  2019 [employer].

## 2018-10-10 NOTE — Assessment & Plan Note (Signed)
Update labs F/U with further recommendations pending lab results - CBC; Future - Comprehensive metabolic panel; Future - Lipid panel; Future - Hemoglobin A1c; Future

## 2018-10-10 NOTE — Assessment & Plan Note (Signed)
Continue current medications continue regularly f/u with psychiatry
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
## 2018-10-10 NOTE — Assessment & Plan Note (Signed)
Continue lisinopril-hctz Update labs - CBC; Future - Comprehensive metabolic panel; Future - Lipid panel; Future

## 2018-10-10 NOTE — Assessment & Plan Note (Signed)
Reviewed annual screening exams, healthy lifestyle, weight loss, additional information provided on AVS - CBC; Future - Comprehensive metabolic panel; Future - Lipid panel; Future - PSA; Future - Hemoglobin A1c; Future  Screening for prostate cancer - PSA; Future

## 2018-10-10 NOTE — Assessment & Plan Note (Signed)
Continue simvastatin Update labs- he just ate lunch so he was instructed to return for labs when fasting Could consider decreasing simvastatin dosage depending on labs - CBC; Future - Comprehensive metabolic panel; Future - Lipid panel; Future - Hemoglobin A1c; Future

## 2018-10-12 ENCOUNTER — Other Ambulatory Visit: Payer: Self-pay | Admitting: Endocrinology

## 2018-10-14 NOTE — Telephone Encounter (Signed)
Please refill x 3 months Further refills would have to be considered by new PCP   

## 2018-10-16 ENCOUNTER — Ambulatory Visit (INDEPENDENT_AMBULATORY_CARE_PROVIDER_SITE_OTHER): Payer: Medicare HMO | Admitting: Cardiovascular Disease

## 2018-10-16 ENCOUNTER — Encounter: Payer: Self-pay | Admitting: Cardiovascular Disease

## 2018-10-16 VITALS — BP 116/78 | HR 79 | Ht 68.0 in | Wt 230.0 lb

## 2018-10-16 DIAGNOSIS — I10 Essential (primary) hypertension: Secondary | ICD-10-CM | POA: Diagnosis not present

## 2018-10-16 DIAGNOSIS — R011 Cardiac murmur, unspecified: Secondary | ICD-10-CM | POA: Diagnosis not present

## 2018-10-16 DIAGNOSIS — E785 Hyperlipidemia, unspecified: Secondary | ICD-10-CM | POA: Diagnosis not present

## 2018-10-16 NOTE — Assessment & Plan Note (Signed)
History of dyslipidemia on statin therapy with recent lipid profile performed 10/09/2017 revealing total cholesterol 137, LDL 51 and HDL of 55.

## 2018-10-16 NOTE — Assessment & Plan Note (Signed)
History of essential hypertension her blood pressure measured today 116/78.  He is on lisinopril and hydrochlorothiazide.  Continue current meds at current dosing.

## 2018-10-16 NOTE — Assessment & Plan Note (Signed)
History of cardiac murmur with echo performed 0/97/3532 showing systolic anterior motion of the mitral valve leaflet, normal LV function with severe left ventricular pressure for but no other valvular abnormalities.  He has a soft outflow tract murmur.

## 2018-10-16 NOTE — Patient Instructions (Signed)
Medication Instructions:  Your physician recommends that you continue on your current medications as directed. Please refer to the Current Medication list given to you today.  If you need a refill on your cardiac medications before your next appointment, please call your pharmacy.   Lab work: NONE If you have labs (blood work) drawn today and your tests are completely normal, you will receive your results only by: Marland Kitchen MyChart Message (if you have MyChart) OR . A paper copy in the mail If you have any lab test that is abnormal or we need to change your treatment, we will call you to review the results.  Testing/Procedures: NONE  Follow-Up: At [facility], you and your health needs are our priority.  As part of our continuing mission to provide you with exceptional heart care, we have created designated Provider Care Teams.  These Care Teams include your primary Cardiologist (physician) and Advanced Practice Providers (APPs -  Physician Assistants and Nurse Practitioners) who all work together to provide you with the care you need, when you need it. You may schedule a follow up appointment as needed.  You may see DR. BERRY or one of the following Advanced Practice Providers on your designated Care Team:   Kerin Ransom, PA-C [place], [place] . Sande Rives, PA-C
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
## 2018-10-19 ENCOUNTER — Other Ambulatory Visit: Payer: Self-pay | Admitting: Endocrinology

## 2018-10-22 DIAGNOSIS — Z0100 Encounter for examination of eyes and vision without abnormal findings: Secondary | ICD-10-CM | POA: Diagnosis not present

## 2019-01-05 ENCOUNTER — Other Ambulatory Visit: Payer: Self-pay | Admitting: Endocrinology

## 2019-01-15 ENCOUNTER — Other Ambulatory Visit: Payer: Self-pay | Admitting: Endocrinology

## 2019-02-05 ENCOUNTER — Other Ambulatory Visit: Payer: Self-pay | Admitting: Endocrinology

## 2019-02-15 ENCOUNTER — Other Ambulatory Visit: Payer: Self-pay | Admitting: Endocrinology

## 2019-02-15 NOTE — Telephone Encounter (Signed)
Please forward refill request to pt's new primary care provider.  

## 2019-03-01 ENCOUNTER — Other Ambulatory Visit: Payer: Self-pay | Admitting: Endocrinology

## 2019-03-01 NOTE — Telephone Encounter (Signed)
Please forward refill request to pt's new primary care provider.  

## 2019-03-03 ENCOUNTER — Other Ambulatory Visit: Payer: Self-pay | Admitting: Endocrinology

## 2019-03-03 NOTE — Telephone Encounter (Signed)
Please forward refill request to pt's new primary care provider.  

## 2019-03-04 ENCOUNTER — Other Ambulatory Visit: Payer: Self-pay | Admitting: Internal Medicine

## 2019-03-04 NOTE — Telephone Encounter (Signed)
Copied from [place] [id number]. Topic: Quick Communication - Rx Refill/Question >> Mar 04, 2019 10:18 AM Leward Quan A wrote: Medication: lisinopril-hydrochlorothiazide (PRINZIDE,ZESTORETIC) 10-12.5 MG tablet, simvastatin (ZOCOR) 80 MG tablet   Has the patient contacted their pharmacy? Yes (Agent: If no, request that the patient contact the pharmacy for the refill.) (Agent: If yes, when and what did the pharmacy advise?)  Preferred Pharmacy (with phone number or street name): [id number] - [place], [place] [phone] (Phone) [phone] (Fax)    Agent: Please be advised that RX refills may take up to 3 business days. We ask that you follow-up with your pharmacy.

## 2019-03-05 MED ORDER — LISINOPRIL-HYDROCHLOROTHIAZIDE 10-12.5 MG PO TABS: 1.0000 | ORAL_TABLET | Freq: Every day | ORAL | 0 refills | Status: DC

## 2019-03-05 MED ORDER — SIMVASTATIN 80 MG PO TABS: ORAL_TABLET | ORAL | 0 refills | Status: DC

## 2019-03-05 NOTE — Telephone Encounter (Signed)
Pt calling back to follow up on refill request.  Pt states he is out and needs these meds asap. Pt had his cpe in Dec 2019.  Pt states he has not had any bp med in 2 weeks Can you please send to   [id number] - [place], [place] [phone] (Phone) [phone] (Fax)   Pt has a TOC appt with Dr Linus Orn in [place] in 2 weeks, do only needs a 30 day to get him.

## 2019-03-05 NOTE — Telephone Encounter (Signed)
Per office policy sent 30 day to local pharmacy until appt w/new provider in June...William Chase

## 2019-03-05 NOTE — Telephone Encounter (Signed)
Patients wife William Chase calling back to confirm refill for  lisinopril-hydrochlorothiazide (PRINZIDE,ZESTORETIC) 10-12.5 MG table and simvastatin (ZOCOR) 80 MG tablet has went to the right office. Was seeing [facility] at [facility] is not until June. Advised refill request will take 3 business days or less.

## 2019-04-03 ENCOUNTER — Other Ambulatory Visit: Payer: Self-pay | Admitting: Internal Medicine

## 2019-04-11 ENCOUNTER — Other Ambulatory Visit: Payer: Self-pay

## 2019-04-11 ENCOUNTER — Ambulatory Visit (INDEPENDENT_AMBULATORY_CARE_PROVIDER_SITE_OTHER): Payer: Medicare Other | Admitting: Internal Medicine

## 2019-04-11 ENCOUNTER — Encounter: Payer: Self-pay | Admitting: Internal Medicine

## 2019-04-11 ENCOUNTER — Other Ambulatory Visit: Payer: Medicare Other

## 2019-04-11 ENCOUNTER — Telehealth: Payer: Self-pay | Admitting: *Deleted

## 2019-04-11 VITALS — BP 125/73 | HR 77 | Ht 68.0 in | Wt 225.0 lb

## 2019-04-11 DIAGNOSIS — R05 Cough: Secondary | ICD-10-CM

## 2019-04-11 DIAGNOSIS — S069X9S Unspecified intracranial injury with loss of consciousness of unspecified duration, sequela: Secondary | ICD-10-CM

## 2019-04-11 DIAGNOSIS — E291 Testicular hypofunction: Secondary | ICD-10-CM

## 2019-04-11 DIAGNOSIS — S069X9A Unspecified intracranial injury with loss of consciousness of unspecified duration, initial encounter: Secondary | ICD-10-CM | POA: Insufficient documentation

## 2019-04-11 DIAGNOSIS — I10 Essential (primary) hypertension: Secondary | ICD-10-CM | POA: Diagnosis not present

## 2019-04-11 DIAGNOSIS — E785 Hyperlipidemia, unspecified: Secondary | ICD-10-CM | POA: Diagnosis not present

## 2019-04-11 DIAGNOSIS — Z20822 Contact with and (suspected) exposure to covid-19: Secondary | ICD-10-CM

## 2019-04-11 DIAGNOSIS — Z1329 Encounter for screening for other suspected endocrine disorder: Secondary | ICD-10-CM

## 2019-04-11 DIAGNOSIS — R413 Other amnesia: Secondary | ICD-10-CM

## 2019-04-11 DIAGNOSIS — Z1389 Encounter for screening for other disorder: Secondary | ICD-10-CM

## 2019-04-11 DIAGNOSIS — S069XAA Unspecified intracranial injury with loss of consciousness status unknown, initial encounter: Secondary | ICD-10-CM | POA: Insufficient documentation

## 2019-04-11 DIAGNOSIS — E559 Vitamin D deficiency, unspecified: Secondary | ICD-10-CM

## 2019-04-11 DIAGNOSIS — R059 Cough, unspecified: Secondary | ICD-10-CM

## 2019-04-11 DIAGNOSIS — R7303 Prediabetes: Secondary | ICD-10-CM

## 2019-04-11 DIAGNOSIS — E538 Deficiency of other specified B group vitamins: Secondary | ICD-10-CM

## 2019-04-11 DIAGNOSIS — Z125 Encounter for screening for malignant neoplasm of prostate: Secondary | ICD-10-CM

## 2019-04-11 MED ORDER — GUAIFENESIN-DM 100-10 MG/5ML PO SYRP: 5.0000 mL | ORAL_SOLUTION | ORAL | 0 refills | Status: DC | PRN

## 2019-04-11 MED ORDER — LISINOPRIL-HYDROCHLOROTHIAZIDE 10-12.5 MG PO TABS: 1.0000 | ORAL_TABLET | Freq: Every day | ORAL | 3 refills | Status: DC

## 2019-04-11 MED ORDER — SIMVASTATIN 40 MG PO TABS: ORAL_TABLET | ORAL | 1 refills | Status: DC

## 2019-04-11 NOTE — Patient Instructions (Signed)
Cough, Adult  Coughing is a reflex that clears your throat and your airways. Coughing helps to heal and protect your lungs. It is normal to cough occasionally, but a cough that happens with other symptoms or lasts a long time may be a sign of a condition that needs treatment. A cough may last only 2-3 weeks (acute), or it may last longer than 8 weeks (chronic). What are the causes? Coughing is commonly caused by:  Breathing in substances that irritate your lungs.  A viral or bacterial respiratory infection.  Allergies.  Asthma.  Postnasal drip.  Smoking.  Acid backing up from the stomach into the esophagus (gastroesophageal reflux).  Certain medicines.  Chronic lung problems, including COPD (or rarely, lung cancer).  Other medical conditions such as heart failure. Follow these instructions at home: Pay attention to any changes in your symptoms. Take these actions to help with your discomfort:  Take medicines only as told by your health care provider. ? If you were prescribed an antibiotic medicine, take it as told by your health care provider. Do not stop taking the antibiotic even if you start to feel better. ? Talk with your health care provider before you take a cough suppressant medicine.  Drink enough fluid to keep your urine clear or pale yellow.  If the air is dry, use a cold steam vaporizer or humidifier in your bedroom or your home to help loosen secretions.  Avoid anything that causes you to cough at work or at home.  If your cough is worse at night, try sleeping in a semi-upright position.  Avoid cigarette smoke. If you smoke, quit smoking. If you need help quitting, ask your health care provider.  Avoid caffeine.  Avoid alcohol.  Rest as needed. Contact a health care provider if:  You have new symptoms.  You cough up pus.  Your cough does not get better after 2-3 weeks, or your cough gets worse.  You cannot control your cough with suppressant  medicines and you are losing sleep.  You develop pain that is getting worse or pain that is not controlled with pain medicines.  You have a fever.  You have unexplained weight loss.  You have night sweats. Get help right away if:  You cough up blood.  You have difficulty breathing.  Your heartbeat is very fast. This information is not intended to replace advice given to you by your health care provider. Make sure you discuss any questions you have with your health care provider. Document Released: 04/08/2011 Document Revised: 03/17/2016 Document Reviewed: 12/17/2014 Elsevier Interactive Patient Education  2019 [employer]  
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
## 2019-04-11 NOTE — Telephone Encounter (Addendum)
Contacted pt to schedule testing; pt offered and accepted appointment at [facility] site 04/11/2019 at 1215; pt given address, location, and instructions that he and any occupants of his vehicle should wear masks; he verbalized understanding; orders placed per protocol. ----- Message from Delorise Jackson, MD sent at 04/11/2019 10:01 AM EDT ----- Please test COVID in [place] call to schedule   Thanks   Payer Plan Sponsor Code Group Number Group Name Tucker [id number]  [id number]  Primary Visit Coverage Subscriber  ID Name [place] [place] Address [id number] [address], [place] [id number]      [employer], [place] [address]  DOB 10-26-1965

## 2019-04-13 LAB — NOVEL CORONAVIRUS, NAA: SARS-CoV-2, NAA: NOT DETECTED

## 2019-04-16 ENCOUNTER — Telehealth: Payer: Self-pay | Admitting: Internal Medicine

## 2019-04-16 NOTE — Telephone Encounter (Signed)
covid negative  Please schedule fasting labs here asap and make sure he wears a mask Is his cough better?   If not  1. Does he want to try an antibiotic?  2. Lisinopril can cause cough as side effect does he want to change this medication?  3. Is he having heartburn?   [place]

## 2019-04-17 NOTE — Telephone Encounter (Signed)
Called patient.  No answer.  LMTCB.

## 2019-04-18 NOTE — Telephone Encounter (Signed)
Called and spoke with the patient and he states his cough has went away and he doen not need a antibiotic and he is having no heartburn.  Arabia Nylund,cma

## 2019-05-06 ENCOUNTER — Other Ambulatory Visit: Payer: Medicare Other

## 2019-05-06 ENCOUNTER — Other Ambulatory Visit: Payer: Self-pay

## 2019-05-07 ENCOUNTER — Other Ambulatory Visit: Payer: Self-pay | Admitting: Internal Medicine

## 2019-05-07 DIAGNOSIS — E785 Hyperlipidemia, unspecified: Secondary | ICD-10-CM

## 2019-05-07 MED ORDER — SIMVASTATIN 40 MG PO TABS: ORAL_TABLET | ORAL | 3 refills | Status: DC

## 2019-05-16 ENCOUNTER — Other Ambulatory Visit (INDEPENDENT_AMBULATORY_CARE_PROVIDER_SITE_OTHER): Payer: Medicare Other

## 2019-05-16 ENCOUNTER — Other Ambulatory Visit: Payer: Self-pay

## 2019-05-16 ENCOUNTER — Telehealth: Payer: Self-pay | Admitting: Internal Medicine

## 2019-05-16 DIAGNOSIS — R7303 Prediabetes: Secondary | ICD-10-CM | POA: Diagnosis not present

## 2019-05-16 DIAGNOSIS — Z1329 Encounter for screening for other suspected endocrine disorder: Secondary | ICD-10-CM | POA: Diagnosis not present

## 2019-05-16 DIAGNOSIS — Z1389 Encounter for screening for other disorder: Secondary | ICD-10-CM | POA: Diagnosis not present

## 2019-05-16 DIAGNOSIS — I10 Essential (primary) hypertension: Secondary | ICD-10-CM

## 2019-05-16 DIAGNOSIS — E538 Deficiency of other specified B group vitamins: Secondary | ICD-10-CM

## 2019-05-16 DIAGNOSIS — Z125 Encounter for screening for malignant neoplasm of prostate: Secondary | ICD-10-CM

## 2019-05-16 DIAGNOSIS — E559 Vitamin D deficiency, unspecified: Secondary | ICD-10-CM | POA: Diagnosis not present

## 2019-05-16 DIAGNOSIS — E785 Hyperlipidemia, unspecified: Secondary | ICD-10-CM | POA: Diagnosis not present

## 2019-05-16 DIAGNOSIS — E291 Testicular hypofunction: Secondary | ICD-10-CM

## 2019-05-16 LAB — HEMOGLOBIN A1C: Hgb A1c MFr Bld: 6 % (ref 4.6–6.5)

## 2019-05-16 LAB — CBC WITH DIFFERENTIAL/PLATELET
Basophils Absolute: 0.1 10*3/uL (ref 0.0–0.1)
Basophils Relative: 0.8 % (ref 0.0–3.0)
Eosinophils Absolute: 0.1 10*3/uL (ref 0.0–0.7)
Eosinophils Relative: 0.8 % (ref 0.0–5.0)
HCT: 39.3 % (ref 39.0–52.0)
Hemoglobin: 13 g/dL (ref 13.0–17.0)
Lymphocytes Relative: 39.3 % (ref 12.0–46.0)
Lymphs Abs: 2.5 10*3/uL (ref 0.7–4.0)
MCHC: 33.1 g/dL (ref 30.0–36.0)
MCV: 85 fl (ref 78.0–100.0)
Monocytes Absolute: 0.5 10*3/uL (ref 0.1–1.0)
Monocytes Relative: 7.2 % (ref 3.0–12.0)
Neutro Abs: 3.3 10*3/uL (ref 1.4–7.7)
Neutrophils Relative %: 51.9 % (ref 43.0–77.0)
Platelets: 273 10*3/uL (ref 150.0–400.0)
RBC: 4.63 Mil/uL (ref 4.22–5.81)
RDW: 13.7 % (ref 11.5–15.5)
WBC: 6.4 10*3/uL (ref 4.0–10.5)

## 2019-05-16 LAB — LIPID PANEL
Cholesterol: 154 mg/dL (ref 0–200)
HDL: 55.1 mg/dL (ref >39.00)
LDL Cholesterol: 74 mg/dL (ref 0–99)
NonHDL: 98.99
Total CHOL/HDL Ratio: 3
Triglycerides: 126 mg/dL (ref 0.0–149.0)
VLDL: 25.2 mg/dL (ref 0.0–40.0)

## 2019-05-16 LAB — URINALYSIS, ROUTINE W REFLEX MICROSCOPIC
Bilirubin Urine: NEGATIVE
Hgb urine dipstick: NEGATIVE
Ketones, ur: NEGATIVE
Leukocytes,Ua: NEGATIVE
Nitrite: NEGATIVE
RBC / HPF: NONE SEEN (ref 0–?)
Specific Gravity, Urine: >=1.03 — AB (ref 1.000–1.030)
Total Protein, Urine: NEGATIVE
Urine Glucose: NEGATIVE
Urobilinogen, UA: 0.2 (ref 0.0–1.0)
pH: 6 (ref 5.0–8.0)

## 2019-05-16 LAB — TESTOSTERONE: Testosterone: 286.29 ng/dL — ABNORMAL LOW (ref 300.00–890.00)

## 2019-05-16 LAB — PSA: PSA: 0.13 ng/mL (ref 0.10–4.00)

## 2019-05-16 LAB — T4, FREE: Free T4: 0.59 ng/dL — ABNORMAL LOW (ref 0.60–1.60)

## 2019-05-16 LAB — COMPREHENSIVE METABOLIC PANEL
ALT: 15 U/L (ref 0–53)
AST: 16 U/L (ref 0–37)
Albumin: 4.3 g/dL (ref 3.5–5.2)
Alkaline Phosphatase: 76 U/L (ref 39–117)
BUN: 14 mg/dL (ref 6–23)
CO2: 29 mEq/L (ref 19–32)
Calcium: 9.4 mg/dL (ref 8.4–10.5)
Chloride: 103 mEq/L (ref 96–112)
Creatinine, Ser: 0.92 mg/dL (ref 0.40–1.50)
GFR: 86.89 mL/min (ref >60.00)
Glucose, Bld: 92 mg/dL (ref 70–99)
Potassium: 4.9 mEq/L (ref 3.5–5.1)
Sodium: 140 mEq/L (ref 135–145)
Total Bilirubin: 0.3 mg/dL (ref 0.2–1.2)
Total Protein: 6.8 g/dL (ref 6.0–8.3)

## 2019-05-16 LAB — VITAMIN B12: Vitamin B-12: 191 pg/mL — ABNORMAL LOW (ref 211–911)

## 2019-05-16 LAB — VITAMIN D 25 HYDROXY (VIT D DEFICIENCY, FRACTURES): VITD: 19.1 ng/mL — ABNORMAL LOW (ref 30.00–100.00)

## 2019-05-16 LAB — TSH: TSH: 1.24 u[IU]/mL (ref 0.35–4.50)

## 2019-05-16 NOTE — Telephone Encounter (Signed)
Pt came into the office today for labs. Pt walked right up to the desk, I asked him to please step back to the circle on the floor.  He walked right back up to the desk, I asked him to please step back again, he stated that I walked up here because you ask me a question and laugh about it. I started checking in pt and he stated that our office can never get anything right and that's our problem.(lab appt was at 11:15am, pt arrived at 9:50am) Pt was very rude for the whole check-in.  Pt was rude last time he was in here as well because we had to reschedule his lab appt do to him having a Bad cough.

## 2019-05-17 ENCOUNTER — Ambulatory Visit (INDEPENDENT_AMBULATORY_CARE_PROVIDER_SITE_OTHER): Payer: Medicare Other | Admitting: Internal Medicine

## 2019-05-17 ENCOUNTER — Ambulatory Visit: Payer: Medicare Other | Admitting: Internal Medicine

## 2019-05-17 ENCOUNTER — Encounter: Payer: Self-pay | Admitting: Internal Medicine

## 2019-05-17 DIAGNOSIS — I10 Essential (primary) hypertension: Secondary | ICD-10-CM | POA: Diagnosis not present

## 2019-05-17 DIAGNOSIS — R7303 Prediabetes: Secondary | ICD-10-CM

## 2019-05-17 DIAGNOSIS — E559 Vitamin D deficiency, unspecified: Secondary | ICD-10-CM | POA: Diagnosis not present

## 2019-05-17 DIAGNOSIS — E291 Testicular hypofunction: Secondary | ICD-10-CM

## 2019-05-17 DIAGNOSIS — E538 Deficiency of other specified B group vitamins: Secondary | ICD-10-CM | POA: Insufficient documentation

## 2019-05-17 MED ORDER — "BD DISP NEEDLE 25G X 1"" MISC": 1.0000 | 4 refills | Status: DC

## 2019-05-17 MED ORDER — CYANOCOBALAMIN 1000 MCG/ML IJ SOLN: 1000.0000 ug | INTRAMUSCULAR | 4 refills | Status: DC

## 2019-05-17 MED ORDER — VITAMIN D3 125 MCG (5000 UT) PO TABS: 1.0000 | ORAL_TABLET | Freq: Every day | ORAL | 3 refills | Status: DC
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
## 2019-05-28 ENCOUNTER — Other Ambulatory Visit: Payer: Self-pay

## 2019-05-28 ENCOUNTER — Encounter: Payer: Self-pay | Admitting: Internal Medicine

## 2019-05-28 ENCOUNTER — Ambulatory Visit (INDEPENDENT_AMBULATORY_CARE_PROVIDER_SITE_OTHER): Payer: Medicare Other | Admitting: Internal Medicine

## 2019-05-28 VITALS — Ht 68.0 in | Wt 230.0 lb

## 2019-05-28 DIAGNOSIS — M86662 Other chronic osteomyelitis, left tibia and fibula: Secondary | ICD-10-CM

## 2019-05-28 MED ORDER — MUPIROCIN 2 % EX OINT: 1.0000 "application " | TOPICAL_OINTMENT | Freq: Three times a day (TID) | CUTANEOUS | 2 refills | Status: DC

## 2019-05-28 MED ORDER — DOXYCYCLINE HYCLATE 100 MG PO TABS: 100.0000 mg | ORAL_TABLET | Freq: Two times a day (BID) | ORAL | 0 refills | Status: DC
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
## 2019-06-06 ENCOUNTER — Telehealth: Payer: Self-pay

## 2019-06-06 NOTE — Telephone Encounter (Signed)
Copied from [place] [id number]. Topic: General - Other >> Jun 06, 2019 10:37 AM Celene Kras A wrote: Reason for CRM: Pts wife called stating the medication for pts leg is working. Please advise.

## 2019-06-18 DIAGNOSIS — G894 Chronic pain syndrome: Secondary | ICD-10-CM | POA: Diagnosis not present

## 2019-06-18 DIAGNOSIS — M79662 Pain in left lower leg: Secondary | ICD-10-CM | POA: Diagnosis not present

## 2019-06-18 DIAGNOSIS — Z79891 Long term (current) use of opiate analgesic: Secondary | ICD-10-CM | POA: Diagnosis not present

## 2019-06-18 DIAGNOSIS — M47816 Spondylosis without myelopathy or radiculopathy, lumbar region: Secondary | ICD-10-CM | POA: Diagnosis not present

## 2019-06-27 ENCOUNTER — Other Ambulatory Visit: Payer: Self-pay | Admitting: Endocrinology

## 2019-06-27 NOTE — Telephone Encounter (Signed)
Please advise if you wish to manage and refill 

## 2019-06-27 NOTE — Telephone Encounter (Signed)
Per Dr. Ellison's request, I am forwarding you this refill request. Please review and refill if appropriate 

## 2019-06-27 NOTE — Telephone Encounter (Signed)
Please forward refill request to pt's primary care provider.   

## 2019-06-28 ENCOUNTER — Telehealth: Payer: Self-pay | Admitting: Internal Medicine

## 2019-06-28 NOTE — Telephone Encounter (Signed)
Left message for patient to return call back. PEC may give information. Mychart has also been sent.

## 2019-06-28 NOTE — Telephone Encounter (Signed)
If skin is better he does not need any more Doxycycline for now as he took it for 3 weeks   Inform pt and call pharmacy to d/c medication  If skin is not better he will need to see wound clinic in [place] or [place]

## 2019-06-28 NOTE — Telephone Encounter (Signed)
Left message for patient to return call back. PEC may give and obtain information.  

## 2019-06-28 NOTE — Telephone Encounter (Signed)
He should be on zocor 40 mg not 80 mg we do not prescribe 80 mg of zocor/simvastatin any more and Rx was sent to his pharmacy 04/2019 x 1 year so he should have the correct Rx  Inform pt  [place]

## 2019-08-13 ENCOUNTER — Telehealth: Payer: Self-pay | Admitting: Internal Medicine

## 2019-08-13 NOTE — Telephone Encounter (Signed)
Is the wound back on his leg  Received refill request for doxycycline ? Does he need this?   If not healing I rec wound clinic vs trauma ortho Is he agreeable to see either in [place]?

## 2019-08-14 NOTE — Telephone Encounter (Signed)
Left message for patient to return call back. PEC may give and obtain information.  

## 2019-08-15 ENCOUNTER — Telehealth: Payer: Self-pay | Admitting: Internal Medicine

## 2019-08-15 ENCOUNTER — Encounter: Payer: Self-pay | Admitting: Internal Medicine

## 2019-08-15 NOTE — Telephone Encounter (Signed)
Is the wound back on his leg  Received refill request for doxycycline ? Does he need this?   If not healing I rec wound clinic vs trauma ortho Is he agreeable to see either in [place]?

## 2019-08-16 NOTE — Telephone Encounter (Signed)
Left message for patient to return call back. PEC may give and obtain information.  

## 2019-08-20 ENCOUNTER — Telehealth: Payer: Self-pay | Admitting: *Deleted

## 2019-08-20 NOTE — Telephone Encounter (Signed)
Copied from [place] [id number]. Topic: Appointment Scheduling - Scheduling Inquiry for Clinic >> Aug 20, 2019  3:05 PM Rayann Heman wrote: Reason for CRM: pt called and stated that he would like a call back to schedule a physical . Pt would like to be schedule before the end of year. Please advise

## 2019-08-21 DIAGNOSIS — M47816 Spondylosis without myelopathy or radiculopathy, lumbar region: Secondary | ICD-10-CM | POA: Diagnosis not present

## 2019-08-21 DIAGNOSIS — M25511 Pain in right shoulder: Secondary | ICD-10-CM | POA: Diagnosis not present

## 2019-08-21 DIAGNOSIS — Z79891 Long term (current) use of opiate analgesic: Secondary | ICD-10-CM | POA: Diagnosis not present

## 2019-08-21 DIAGNOSIS — M79662 Pain in left lower leg: Secondary | ICD-10-CM | POA: Diagnosis not present

## 2019-08-21 DIAGNOSIS — G894 Chronic pain syndrome: Secondary | ICD-10-CM | POA: Diagnosis not present

## 2019-08-23 ENCOUNTER — Telehealth: Payer: Self-pay | Admitting: Internal Medicine

## 2019-08-23 NOTE — Telephone Encounter (Signed)
I called pt and left a vm to call ofc to schedule physical.

## 2019-09-11 ENCOUNTER — Telehealth: Payer: Self-pay | Admitting: Internal Medicine

## 2019-09-11 NOTE — Telephone Encounter (Signed)
Is the wound back on your leg  Received refill request for doxycycline antibiotic? Does you need this?   If not healing I recommend wound clinic [facility] Are you agreeable to see either in [place] ?   This is 2nd or 3rd refill request for this has this been addressed by medical asst message was sent a long time ago for this?   Need you help please call patient   thanks [place]

## 2019-09-12 NOTE — Telephone Encounter (Signed)
Left message for patient to return call to office, [place] nurse may advise of below information.

## 2019-10-16 DIAGNOSIS — G894 Chronic pain syndrome: Secondary | ICD-10-CM | POA: Diagnosis not present

## 2019-10-16 DIAGNOSIS — M79662 Pain in left lower leg: Secondary | ICD-10-CM | POA: Diagnosis not present

## 2019-10-16 DIAGNOSIS — M47816 Spondylosis without myelopathy or radiculopathy, lumbar region: Secondary | ICD-10-CM | POA: Diagnosis not present

## 2019-10-16 DIAGNOSIS — M25511 Pain in right shoulder: Secondary | ICD-10-CM | POA: Diagnosis not present

## 2019-11-21 ENCOUNTER — Encounter: Payer: Self-pay | Admitting: Internal Medicine

## 2019-11-21 ENCOUNTER — Ambulatory Visit (INDEPENDENT_AMBULATORY_CARE_PROVIDER_SITE_OTHER): Payer: Medicare Other | Admitting: Internal Medicine

## 2019-11-21 ENCOUNTER — Other Ambulatory Visit: Payer: Self-pay

## 2019-11-21 VITALS — Ht 68.0 in | Wt 230.0 lb

## 2019-11-21 DIAGNOSIS — J069 Acute upper respiratory infection, unspecified: Secondary | ICD-10-CM | POA: Diagnosis not present

## 2019-11-21 DIAGNOSIS — J029 Acute pharyngitis, unspecified: Secondary | ICD-10-CM

## 2019-11-21 DIAGNOSIS — E538 Deficiency of other specified B group vitamins: Secondary | ICD-10-CM

## 2019-11-21 DIAGNOSIS — I10 Essential (primary) hypertension: Secondary | ICD-10-CM

## 2019-11-21 DIAGNOSIS — J209 Acute bronchitis, unspecified: Secondary | ICD-10-CM | POA: Diagnosis not present

## 2019-11-21 DIAGNOSIS — E785 Hyperlipidemia, unspecified: Secondary | ICD-10-CM | POA: Diagnosis not present

## 2019-11-21 DIAGNOSIS — E559 Vitamin D deficiency, unspecified: Secondary | ICD-10-CM

## 2019-11-21 DIAGNOSIS — Z1329 Encounter for screening for other suspected endocrine disorder: Secondary | ICD-10-CM

## 2019-11-21 DIAGNOSIS — Z125 Encounter for screening for malignant neoplasm of prostate: Secondary | ICD-10-CM

## 2019-11-21 DIAGNOSIS — R7303 Prediabetes: Secondary | ICD-10-CM

## 2019-11-21 MED ORDER — AMOXICILLIN-POT CLAVULANATE 875-125 MG PO TABS: 1.0000 | ORAL_TABLET | Freq: Two times a day (BID) | ORAL | 0 refills | Status: DC

## 2019-11-21 MED ORDER — CYANOCOBALAMIN 1000 MCG/ML IJ SOLN: 1000.0000 ug | INTRAMUSCULAR | 4 refills | Status: DC

## 2019-11-21 MED ORDER — DM-GUAIFENESIN ER 60-1200 MG PO TB12: 1.0000 | ORAL_TABLET | Freq: Two times a day (BID) | ORAL | 2 refills | Status: DC

## 2019-11-21 MED ORDER — SIMVASTATIN 40 MG PO TABS: ORAL_TABLET | ORAL | 3 refills | Status: DC

## 2019-11-21 MED ORDER — LISINOPRIL-HYDROCHLOROTHIAZIDE 10-12.5 MG PO TABS: 1.0000 | ORAL_TABLET | Freq: Every day | ORAL | 3 refills | Status: DC

## 2019-11-21 MED ORDER — VITAMIN D3 125 MCG (5000 UT) PO TABS: 1.0000 | ORAL_TABLET | Freq: Every day | ORAL | 3 refills | Status: DC

## 2019-11-21 MED ORDER — PREDNISONE 20 MG PO TABS: 20.0000 mg | ORAL_TABLET | Freq: Every day | ORAL | 0 refills | Status: DC

## 2019-11-21 MED ORDER — FLUTICASONE-SALMETEROL 100-50 MCG/DOSE IN AEPB: INHALATION_SPRAY | RESPIRATORY_TRACT | 2 refills | Status: DC
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
## 2019-11-25 ENCOUNTER — Telehealth: Payer: Self-pay | Admitting: Internal Medicine

## 2019-11-25 NOTE — Telephone Encounter (Signed)
Pt wanted to let PCP know he is feeling better and medicine is working but wants a call back about symptoms?

## 2019-11-25 NOTE — Telephone Encounter (Signed)
Left message to call office , to discuss symptoms.

## 2019-12-05 ENCOUNTER — Telehealth: Payer: Self-pay | Admitting: Internal Medicine

## 2019-12-05 NOTE — Telephone Encounter (Signed)
Left message for patient to call back and schedule Medicare Annual Wellness Visit (AWV) either virtually or audio only.  Last AWV 12.17.18; please schedule at anytime with Denisa O'Brien-Blaney at [facility].

## 2019-12-09 ENCOUNTER — Telehealth: Payer: Self-pay | Admitting: Internal Medicine

## 2019-12-09 NOTE — Telephone Encounter (Signed)
Received refill request for doxycycline  I think for chronic wound.osteomyelitis  He needs to see trauma surgery outpatient or wound clinic  Does he want a referral?   [place]

## 2019-12-09 NOTE — Telephone Encounter (Signed)
Left message to return call 

## 2019-12-18 DIAGNOSIS — M47816 Spondylosis without myelopathy or radiculopathy, lumbar region: Secondary | ICD-10-CM | POA: Diagnosis not present

## 2019-12-18 DIAGNOSIS — M25511 Pain in right shoulder: Secondary | ICD-10-CM | POA: Diagnosis not present

## 2019-12-18 DIAGNOSIS — M79662 Pain in left lower leg: Secondary | ICD-10-CM | POA: Diagnosis not present

## 2019-12-18 DIAGNOSIS — G894 Chronic pain syndrome: Secondary | ICD-10-CM | POA: Diagnosis not present

## 2020-02-19 DIAGNOSIS — M79662 Pain in left lower leg: Secondary | ICD-10-CM | POA: Diagnosis not present

## 2020-02-19 DIAGNOSIS — G894 Chronic pain syndrome: Secondary | ICD-10-CM | POA: Diagnosis not present

## 2020-02-19 DIAGNOSIS — M25511 Pain in right shoulder: Secondary | ICD-10-CM | POA: Diagnosis not present

## 2020-02-19 DIAGNOSIS — M47816 Spondylosis without myelopathy or radiculopathy, lumbar region: Secondary | ICD-10-CM | POA: Diagnosis not present

## 2020-04-10 ENCOUNTER — Telehealth: Payer: Self-pay | Admitting: Internal Medicine

## 2020-04-10 NOTE — Telephone Encounter (Signed)
Called to express condolences for wife Verner Mccrone death 27-Apr-2020 10:29 am Pt sad his mom is not doing well and his dog ran away this am when EMS had door open 10:29 am   He is upset but thinks he is doing ok   [place]

## 2020-04-15 DIAGNOSIS — G894 Chronic pain syndrome: Secondary | ICD-10-CM | POA: Diagnosis not present

## 2020-04-15 DIAGNOSIS — M47816 Spondylosis without myelopathy or radiculopathy, lumbar region: Secondary | ICD-10-CM | POA: Diagnosis not present

## 2020-04-15 DIAGNOSIS — M25511 Pain in right shoulder: Secondary | ICD-10-CM | POA: Diagnosis not present

## 2020-04-15 DIAGNOSIS — M79662 Pain in left lower leg: Secondary | ICD-10-CM | POA: Diagnosis not present

## 2020-04-30 ENCOUNTER — Telehealth: Payer: Self-pay | Admitting: Internal Medicine

## 2020-04-30 ENCOUNTER — Encounter: Payer: Self-pay | Admitting: Internal Medicine

## 2020-04-30 ENCOUNTER — Ambulatory Visit (INDEPENDENT_AMBULATORY_CARE_PROVIDER_SITE_OTHER): Payer: Medicare Other | Admitting: Internal Medicine

## 2020-04-30 ENCOUNTER — Ambulatory Visit (INDEPENDENT_AMBULATORY_CARE_PROVIDER_SITE_OTHER): Payer: Medicare Other

## 2020-04-30 ENCOUNTER — Other Ambulatory Visit: Payer: Self-pay

## 2020-04-30 VITALS — BP 104/68 | HR 83 | Temp 97.9°F | Ht 68.0 in | Wt 210.1 lb

## 2020-04-30 DIAGNOSIS — M19072 Primary osteoarthritis, left ankle and foot: Secondary | ICD-10-CM | POA: Diagnosis not present

## 2020-04-30 DIAGNOSIS — M79675 Pain in left toe(s): Secondary | ICD-10-CM

## 2020-04-30 DIAGNOSIS — E663 Overweight: Secondary | ICD-10-CM | POA: Insufficient documentation

## 2020-04-30 DIAGNOSIS — Z1283 Encounter for screening for malignant neoplasm of skin: Secondary | ICD-10-CM | POA: Diagnosis not present

## 2020-04-30 DIAGNOSIS — F4321 Adjustment disorder with depressed mood: Secondary | ICD-10-CM

## 2020-04-30 DIAGNOSIS — R0781 Pleurodynia: Secondary | ICD-10-CM | POA: Diagnosis not present

## 2020-04-30 DIAGNOSIS — R079 Chest pain, unspecified: Secondary | ICD-10-CM | POA: Diagnosis not present

## 2020-04-30 DIAGNOSIS — S99922A Unspecified injury of left foot, initial encounter: Secondary | ICD-10-CM | POA: Diagnosis not present

## 2020-04-30 DIAGNOSIS — L729 Follicular cyst of the skin and subcutaneous tissue, unspecified: Secondary | ICD-10-CM | POA: Diagnosis not present

## 2020-04-30 DIAGNOSIS — L219 Seborrheic dermatitis, unspecified: Secondary | ICD-10-CM | POA: Diagnosis not present

## 2020-04-30 DIAGNOSIS — E669 Obesity, unspecified: Secondary | ICD-10-CM

## 2020-04-30 HISTORY — DX: Seborrheic dermatitis, unspecified: L21.9

## 2020-04-30 MED ORDER — KETOCONAZOLE 2 % EX SHAM: 1.0000 "application " | MEDICATED_SHAMPOO | CUTANEOUS | 11 refills | Status: DC

## 2020-04-30 MED ORDER — DICLOFENAC SODIUM 1 % EX GEL: 2.0000 g | Freq: Four times a day (QID) | CUTANEOUS | 0 refills | Status: DC | PRN

## 2020-04-30 MED ORDER — FLUOCINOLONE ACETONIDE 0.01 % OT OIL: 5.0000 [drp] | TOPICAL_OIL | Freq: Two times a day (BID) | OTIC | 5 refills | Status: DC | PRN

## 2020-04-30 NOTE — Telephone Encounter (Signed)
Please advise on new medication 

## 2020-04-30 NOTE — Progress Notes (Signed)
Patient presenting with abdominal, side, and back pain. Rated 10/10. Patient fell on a [place] and landed on his stomach. Patient has had past surgeries that has placed his stomach muscles on the left lower leg.   Patient flagged: Current status:  PATIENT IS OVERDUE FOR BMI FOLLOW UP PLAN BMI is estimated to be 31.9 based on the last recorded weight and height Current status:  OVERDUE FOR COLORECTAL CANCER SCREENING

## 2020-04-30 NOTE — Patient Instructions (Addendum)
Lidocaine pain patch over the counter buy this over the counter show to pharmacist  Try heating pad to abdomen  And ice   voltaren gel for left toe  [place] [facility] -referral placed they will call you  [place]  [phone]    Rib Fracture  A rib fracture is a break or crack in one of the bones of the ribs. The ribs are long, curved bones that wrap around your chest and attach to your spine and your breastbone. The ribs protect your heart, lungs, and other organs in the chest. A broken or cracked rib is often painful but is not usually serious. Most rib fractures heal on their own over time. However, rib fractures can be more serious if multiple ribs are broken or if broken ribs move out of place and push against other structures or organs. What are the causes? This condition is caused by:  Repetitive movements with high force, such as pitching a baseball or having severe coughing spells.  A direct blow to the chest, such as a sports injury, a car accident, or a fall.  Cancer that has spread to the bones, which can weaken bones and cause them to break. What are the signs or symptoms? Symptoms of this condition include:  Pain when you breathe in or cough.  Pain when someone presses on the injured area.  Feeling short of breath. How is this diagnosed? This condition is diagnosed with a physical exam and medical history. Imaging tests may also be done, such as:  Chest X-ray.  CT scan.  MRI.  Bone scan.  Chest ultrasound. How is this treated? Treatment for this condition depends on the severity of the fracture. Most rib fractures usually heal on their own in 1-3 months. Sometimes healing takes longer if there is a cough that does not stop or if there are other activities that make the injury worse (aggravating factors). While you heal, you will be given medicines to control the pain. You will also be taught deep breathing exercises. Severe injuries  may require hospitalization or surgery. Follow these instructions at home: Managing pain, stiffness, and swelling  If directed, apply ice to the injured area. ? Put ice in a plastic bag. ? Place a towel between your skin and the bag. ? Leave the ice on for 20 minutes, 2-3 times a day.  Take over-the-counter and prescription medicines only as told by your health care provider. Activity  Avoid a lot of activity and any activities or movements that cause pain. Be careful during activities and avoid bumping the injured rib.  Slowly increase your activity as told by your health care provider. General instructions  Do deep breathing exercises as told by your health care provider. This helps prevent pneumonia, which is a common complication of a broken rib. Your health care provider may instruct you to: ? Take deep breaths several times a day. ? Try to cough several times a day, holding a pillow against the injured area. ? Use a device called incentive spirometer to practice deep breathing several times a day.  Drink enough fluid to keep your urine pale yellow.  Do not wear a rib belt or binder. These restrict breathing, which can lead to pneumonia.  Keep all follow-up visits as told by your health care provider. This is important. Contact a health care provider if:  You have a fever. Get help right away if:  You have difficulty breathing or you are short  of breath.  You develop a cough that does not stop, or you cough up thick or bloody sputum.  You have nausea, vomiting, or pain in your abdomen.  Your pain gets worse and medicine does not help. Summary  A rib fracture is a break or crack in one of the bones of the ribs.  A broken or cracked rib is often painful but is not usually serious.  Most rib fractures heal on their own over time.  Treatment for this condition depends on the severity of the fracture.  Avoid a lot of activity and any activities or movements that cause  pain. This information is not intended to replace advice given to you by your health care provider. Make sure you discuss any questions you have with your health care provider. Document Revised: 09/22/2017 Document Reviewed: 01/09/2017 Elsevier Patient Education  [place].

## 2020-04-30 NOTE — Telephone Encounter (Signed)
Pt states that the medication that was called in for him today is too expensive but did not know the name of the rx. He also needs clarification on what he needs OTC as well. Please advise

## 2020-04-30 NOTE — Telephone Encounter (Signed)
Lidocaine patches for ribs and voltaren gel are over the counter ask the pharmacist to give to him   Use good Rx for ear solution fluocinolone if too expensive when sees dermatology they may have samples ask for samples Also nizoral shampoo is prescription stronger but also otc maybe cheaper  [place]
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
## 2020-05-01 ENCOUNTER — Other Ambulatory Visit: Payer: Self-pay | Admitting: Internal Medicine

## 2020-05-01 ENCOUNTER — Telehealth: Payer: Self-pay | Admitting: Internal Medicine

## 2020-05-01 DIAGNOSIS — J189 Pneumonia, unspecified organism: Secondary | ICD-10-CM

## 2020-05-01 MED ORDER — AZITHROMYCIN 250 MG PO TABS: ORAL_TABLET | ORAL | 0 refills | Status: DC

## 2020-05-01 NOTE — Telephone Encounter (Signed)
Left message to return call 

## 2020-05-01 NOTE — Telephone Encounter (Signed)
-----   Message from Delorise Jackson, MD sent at 05/01/2020  9:55 AM EDT ----- Left sided ribes with chronic rib fractures 5-7 ribs can use voltaren gel or lidocaine pain patch

## 2020-05-01 NOTE — Telephone Encounter (Signed)
-----   Message from Delorise Jackson, MD sent at 05/01/2020  9:54 AM EDT ----- Mild arthritis left great toe can try voltaren gel otc

## 2020-05-01 NOTE — Telephone Encounter (Signed)
Pt called to get xray results and to see if something could be called in for pain

## 2020-05-01 NOTE — Telephone Encounter (Signed)
-----   Message from Delorise Jackson, MD sent at 05/01/2020  9:54 AM EDT ----- No rib fracture can try voltaren gel or lidocaine pain patch otc

## 2020-05-01 NOTE — Telephone Encounter (Signed)
-----   Message from Delorise Jackson, MD sent at 05/01/2020  9:57 AM EDT ----- Lung changes could be mild fluid of atypical infection  Is he having cough?fever? Sob?   Can he schedule covid 19 testing Ive sent Zpack to pharmacy

## 2020-05-04 NOTE — Telephone Encounter (Signed)
Please see 05/01/20 telephone encounter

## 2020-05-04 NOTE — Telephone Encounter (Signed)
Patient informed and verbalized understanding.  States the two medications sent in ion 04/30/20 were too expensive. Informed the patient that he could also get the lidocaine patches and Voltaren over the counter.   Patient was informed that the antibiotic pills were sent in 05/01/20. States [facility] and be Covid-19 tested at the same time. Informed the Patient to call ahead to his [facility] to be informed on what their protocol for testing and pick up is. Patient verbalized understanding

## 2020-05-12 ENCOUNTER — Telehealth: Payer: Self-pay | Admitting: Internal Medicine

## 2020-05-12 ENCOUNTER — Telehealth: Payer: Medicare Other | Admitting: Internal Medicine

## 2020-05-12 NOTE — Telephone Encounter (Signed)
Patient no-showed today's appointment; appointment was for 7/20 10 am, provider notified for review of record. Left message to re-schedule on patient's voicemail

## 2020-05-12 NOTE — Telephone Encounter (Signed)
Left message to return call. Attempting to start virtual visit

## 2020-05-13 ENCOUNTER — Other Ambulatory Visit: Payer: Self-pay

## 2020-05-14 ENCOUNTER — Encounter: Payer: Self-pay | Admitting: Internal Medicine

## 2020-05-14 ENCOUNTER — Ambulatory Visit (INDEPENDENT_AMBULATORY_CARE_PROVIDER_SITE_OTHER): Payer: Medicare Other | Admitting: Internal Medicine

## 2020-05-14 ENCOUNTER — Ambulatory Visit: Payer: Medicare Other | Admitting: Dermatology

## 2020-05-14 VITALS — BP 112/74 | HR 88 | Temp 97.6°F | Ht 68.0 in | Wt 203.8 lb

## 2020-05-14 DIAGNOSIS — R7303 Prediabetes: Secondary | ICD-10-CM | POA: Diagnosis not present

## 2020-05-14 DIAGNOSIS — Z0000 Encounter for general adult medical examination without abnormal findings: Secondary | ICD-10-CM

## 2020-05-14 DIAGNOSIS — R8271 Bacteriuria: Secondary | ICD-10-CM

## 2020-05-14 DIAGNOSIS — S81802A Unspecified open wound, left lower leg, initial encounter: Secondary | ICD-10-CM | POA: Diagnosis not present

## 2020-05-14 DIAGNOSIS — Z1329 Encounter for screening for other suspected endocrine disorder: Secondary | ICD-10-CM

## 2020-05-14 DIAGNOSIS — E538 Deficiency of other specified B group vitamins: Secondary | ICD-10-CM

## 2020-05-14 DIAGNOSIS — I10 Essential (primary) hypertension: Secondary | ICD-10-CM

## 2020-05-14 DIAGNOSIS — Z1211 Encounter for screening for malignant neoplasm of colon: Secondary | ICD-10-CM

## 2020-05-14 DIAGNOSIS — M86662 Other chronic osteomyelitis, left tibia and fibula: Secondary | ICD-10-CM | POA: Diagnosis not present

## 2020-05-14 DIAGNOSIS — R9389 Abnormal findings on diagnostic imaging of other specified body structures: Secondary | ICD-10-CM

## 2020-05-14 DIAGNOSIS — N3000 Acute cystitis without hematuria: Secondary | ICD-10-CM

## 2020-05-14 DIAGNOSIS — E559 Vitamin D deficiency, unspecified: Secondary | ICD-10-CM | POA: Diagnosis not present

## 2020-05-14 DIAGNOSIS — Z125 Encounter for screening for malignant neoplasm of prostate: Secondary | ICD-10-CM | POA: Diagnosis not present

## 2020-05-14 DIAGNOSIS — E785 Hyperlipidemia, unspecified: Secondary | ICD-10-CM | POA: Diagnosis not present

## 2020-05-14 LAB — COMPREHENSIVE METABOLIC PANEL
ALT: 38 U/L (ref 0–53)
AST: 33 U/L (ref 0–37)
Albumin: 4.4 g/dL (ref 3.5–5.2)
Alkaline Phosphatase: 120 U/L — ABNORMAL HIGH (ref 39–117)
BUN: 21 mg/dL (ref 6–23)
CO2: 30 mEq/L (ref 19–32)
Calcium: 9.8 mg/dL (ref 8.4–10.5)
Chloride: 99 mEq/L (ref 96–112)
Creatinine, Ser: 0.92 mg/dL (ref 0.40–1.50)
GFR: 86.54 mL/min (ref >60.00)
Glucose, Bld: 109 mg/dL — ABNORMAL HIGH (ref 70–99)
Potassium: 4.3 mEq/L (ref 3.5–5.1)
Sodium: 136 mEq/L (ref 135–145)
Total Bilirubin: 0.3 mg/dL (ref 0.2–1.2)
Total Protein: 7.6 g/dL (ref 6.0–8.3)

## 2020-05-14 LAB — CBC WITH DIFFERENTIAL/PLATELET
Basophils Absolute: 0 10*3/uL (ref 0.0–0.1)
Basophils Relative: 0.6 % (ref 0.0–3.0)
Eosinophils Absolute: 0.1 10*3/uL (ref 0.0–0.7)
Eosinophils Relative: 1 % (ref 0.0–5.0)
HCT: 41.4 % (ref 39.0–52.0)
Hemoglobin: 14 g/dL (ref 13.0–17.0)
Lymphocytes Relative: 33.6 % (ref 12.0–46.0)
Lymphs Abs: 2.4 10*3/uL (ref 0.7–4.0)
MCHC: 33.9 g/dL (ref 30.0–36.0)
MCV: 85.2 fl (ref 78.0–100.0)
Monocytes Absolute: 0.7 10*3/uL (ref 0.1–1.0)
Monocytes Relative: 10.3 % (ref 3.0–12.0)
Neutro Abs: 3.8 10*3/uL (ref 1.4–7.7)
Neutrophils Relative %: 54.5 % (ref 43.0–77.0)
Platelets: 318 10*3/uL (ref 150.0–400.0)
RBC: 4.86 Mil/uL (ref 4.22–5.81)
RDW: 14.1 % (ref 11.5–15.5)
WBC: 7 10*3/uL (ref 4.0–10.5)

## 2020-05-14 LAB — LIPID PANEL
Cholesterol: 149 mg/dL (ref 0–200)
HDL: 45.3 mg/dL (ref >39.00)
LDL Cholesterol: 81 mg/dL (ref 0–99)
NonHDL: 103.99
Total CHOL/HDL Ratio: 3
Triglycerides: 116 mg/dL (ref 0.0–149.0)
VLDL: 23.2 mg/dL (ref 0.0–40.0)

## 2020-05-14 LAB — HEMOGLOBIN A1C: Hgb A1c MFr Bld: 5.6 % (ref 4.6–6.5)

## 2020-05-14 LAB — VITAMIN B12: Vitamin B-12: 382 pg/mL (ref 211–911)

## 2020-05-14 LAB — TSH: TSH: 1.39 u[IU]/mL (ref 0.35–4.50)

## 2020-05-14 LAB — VITAMIN D 25 HYDROXY (VIT D DEFICIENCY, FRACTURES): VITD: 45.14 ng/mL (ref 30.00–100.00)

## 2020-05-14 LAB — PSA: PSA: 0.12 ng/mL (ref 0.10–4.00)

## 2020-05-14 MED ORDER — DOXYCYCLINE HYCLATE 100 MG PO TABS: 100.0000 mg | ORAL_TABLET | Freq: Two times a day (BID) | ORAL | 0 refills | Status: DC

## 2020-05-14 MED ORDER — MUPIROCIN 2 % EX OINT: 1.0000 "application " | TOPICAL_OINTMENT | Freq: Three times a day (TID) | CUTANEOUS | 2 refills | Status: DC

## 2020-05-14 NOTE — Patient Instructions (Addendum)
Vitamin D3 4000 to 5000 IU daily  B12 monthly at pharmacy  Let me know about MRI low back and physical therapy in the future  Dr. [name]  [place]  [place] [phone]

## 2020-05-14 NOTE — Progress Notes (Signed)
Patient presenting for follow up on rib cage pain. States he is still sore and in pain. Pain rated 6/10 in both sides and the left leg.
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
## 2020-05-15 ENCOUNTER — Ambulatory Visit: Payer: Medicare Other | Admitting: Internal Medicine

## 2020-05-15 LAB — URINALYSIS, ROUTINE W REFLEX MICROSCOPIC
Bilirubin Urine: NEGATIVE
Glucose, UA: NEGATIVE
Hgb urine dipstick: NEGATIVE
Hyaline Cast: NONE SEEN /LPF
Ketones, ur: NEGATIVE
Nitrite: NEGATIVE
Protein, ur: NEGATIVE
RBC / HPF: NONE SEEN /HPF (ref 0–2)
Specific Gravity, Urine: 1.022 (ref 1.001–1.03)
Squamous Epithelial / HPF: NONE SEEN /HPF (ref <=5)
pH: <=5 (ref 5.0–8.0)

## 2020-05-18 ENCOUNTER — Other Ambulatory Visit: Payer: Self-pay | Admitting: Internal Medicine

## 2020-05-18 DIAGNOSIS — N3000 Acute cystitis without hematuria: Secondary | ICD-10-CM

## 2020-05-18 DIAGNOSIS — R8271 Bacteriuria: Secondary | ICD-10-CM

## 2020-05-19 ENCOUNTER — Telehealth: Payer: Self-pay | Admitting: Internal Medicine

## 2020-05-19 NOTE — Addendum Note (Signed)
Addended by: [name] on: 05/19/2020 12:48 PM   Modules accepted: Orders

## 2020-05-19 NOTE — Telephone Encounter (Signed)
McLean-Scocuzza, William Glow, MD 19 minutes ago (12:49 PM)  TM   Call pt  If he thinks he has UTI sx's  I need urine he can do at [place] order in  If he doesn't want to do this  He has a Rx doxycycline which I gave him at his visit  [place]

## 2020-05-19 NOTE — Telephone Encounter (Signed)
Added to new encounter for 05/19/20.

## 2020-05-19 NOTE — Telephone Encounter (Signed)
Call pt  If he thinks he has UTI sx's  I need urine he can do at [place] order in  If he doesn't want to do this  He has a Rx doxycycline which I gave him at his visit  [place]

## 2020-05-19 NOTE — Telephone Encounter (Signed)
Pt called in and said he was supposed to have an antibiotic called in for him. I let him know that the note said he needs an urine. He said he already had one and wanted to speak to someone about this. Please call patient.

## 2020-05-19 NOTE — Telephone Encounter (Signed)
Patient informed and verbalized understanding.  Declines culture at the time. Will fill the doxy that was given to him at last visit. States he does not have time to come in for a culture.

## 2020-05-20 ENCOUNTER — Other Ambulatory Visit: Payer: Medicare Other

## 2020-06-11 ENCOUNTER — Other Ambulatory Visit: Payer: Self-pay

## 2020-06-11 ENCOUNTER — Ambulatory Visit: Payer: Medicare Other | Admitting: Dermatology

## 2020-06-11 DIAGNOSIS — Z8739 Personal history of other diseases of the musculoskeletal system and connective tissue: Secondary | ICD-10-CM

## 2020-06-11 DIAGNOSIS — L918 Other hypertrophic disorders of the skin: Secondary | ICD-10-CM

## 2020-06-11 DIAGNOSIS — T148XXA Other injury of unspecified body region, initial encounter: Secondary | ICD-10-CM

## 2020-06-11 DIAGNOSIS — L905 Scar conditions and fibrosis of skin: Secondary | ICD-10-CM

## 2020-06-11 DIAGNOSIS — L7211 Pilar cyst: Secondary | ICD-10-CM

## 2020-06-11 DIAGNOSIS — M7032 Other bursitis of elbow, left elbow: Secondary | ICD-10-CM | POA: Diagnosis not present

## 2020-06-11 DIAGNOSIS — L820 Inflamed seborrheic keratosis: Secondary | ICD-10-CM

## 2020-06-11 DIAGNOSIS — L219 Seborrheic dermatitis, unspecified: Secondary | ICD-10-CM

## 2020-06-11 DIAGNOSIS — L98499 Non-pressure chronic ulcer of skin of other sites with unspecified severity: Secondary | ICD-10-CM

## 2020-06-11 MED ORDER — FLUOCINOLONE ACETONIDE 0.01 % OT OIL: 5.0000 [drp] | TOPICAL_OIL | Freq: Two times a day (BID) | OTIC | 5 refills | Status: DC | PRN

## 2020-06-11 NOTE — Patient Instructions (Addendum)
Pre-Operative Instructions  You are scheduled for a surgical procedure at [facility]. We recommend you read the following instructions. If you have any questions or concerns, please call the office at [phone].  1. Shower and wash the entire body with soap and water the day of your surgery paying special attention to cleansing at and around the planned surgery site.  2. Avoid aspirin or aspirin containing products at least fourteen (14) days prior to your surgical procedure and for at least one week (7 Days) after your surgical procedure. If you take aspirin on a regular basis for heart disease or history of stroke or for any other reason, we may recommend you continue taking aspirin but please notify us if you take this on a regular basis. Aspirin can cause more bleeding to occur during surgery as well as prolonged bleeding and bruising after surgery.   3. Avoid other nonsteroidal pain medications at least one week prior to surgery and at least one week prior to your surgery. These include medications such as Ibuprofen (Motrin, Advil and Nuprin), Naprosyn, Voltaren, Relafen, etc. If medications are used for therapeutic reasons, please inform us as they can cause increased bleeding or prolonged bleeding during and bruising after surgical procedures.   4. Please advice [place] if you are taking any "blood thinner" medications such as Coumadin or Dipyridamole or Plavix or similar medications. These cause increased bleeding and prolonged bleeding during and bruising after surgical procedures. We may have to consider discontinuing these medications briefly prior to and shortly after your surgery, if safe to do so.   5. Please inform us of all medications you are currently taking. All medications that are taken regularly should be taken the day of surgery as you always do. Nevertheless, we need to be informed of what medications you are taking prior to surgery to whether they will affect the  procedure or cause any complications.   6. Please inform us of any medication allergies. Also inform us of whether you have allergies to Latex or rubber products or whether you have had any adverse reaction to Lidocaine or Epinephrine.  7. Please inform us of any prosthetic or artificial body parts such as artificial heart valve, joint replacements, etc., or similar condition that might require preoperative antibiotics.   8. We recommend avoidance of alcohol at least two weeks prior to surgery and continued avoidence for at least two weeks after surgery.   9. We recommend discontinuation of tobacco smoking at least two weeks prior to surgery and continued abstinence for at least two weeks after surgery.  10. Do not plan strenuous exercise, strenuous work or strenuous lifting for approximately four weeks after your surgery.   11. We request if you are unable to make your scheduled surgical appointment, please call us at least a week in advance or as soon as you are aware of a problem sot aht we can cancel or reschedule you.   12. You MAKE TAKE TYLENOL (acetaminophen) for pain as it is not a blood thinner.   13. PLEASE PLAN TO BE IN TOWN FOR TWO WEEKS FOLLOWING SURGERY, THIS IS IMPORTANT SO YOU CAN BE CHECKED FOR DRESSING CHANGES, SUTURE REMOVAL AND TO MONITOR FOR POSSIBLE COMPLICATIONS.    Cryotherapy Aftercare  . Wash gently with soap and water everyday.   Marland Kitchen Apply Vaseline and Band-Aid daily until healed.  Prior to procedure, discussed risks of blister formation, small wound, skin dyspigmentation, or rare scar following cryotherapy.    Duoderm patches (  sent to mail order pharmacy): apply Mupirocin ointment to wounds on legs then cut Duoderm to fit and apply over area. Repeat process every 3-4 days.

## 2020-06-11 NOTE — Progress Notes (Signed)
   New Patient Visit  Subjective  William Chase is a 54 y.o. male who presents for the following: Cyst (Left scalp, years, larger. ), lesion (Left elbow. Dur: 1 month. Swells, drains), Skin Problem (Legs. C/O chronic bone infection.), and Dermatitis (Scalp. Using Ketoconazole 2% shampoo and Fluocinolone oil, Rx'd by PCP. Does not use consistently.).   Objective  Well appearing patient in no apparent distress; mood and affect are within normal limits.  Review of Systems: No other skin or systemic complaints except as noted in HPI or Assessment and Plan.  A focused examination was performed including scalp, face, arms, legs. Relevant physical exam findings are noted in the Assessment and Plan.   Objective  Scalp, ears: Pink patches with greasy scale.   Objective  Left Parietal Scalp: 2x3.2cm Subcutaneous nodule at scalp.  Pre-Op instructions given.  Objective  Bilateral Axillae: Fleshy, skin-colored pedunculated papules.    Objective  Left Preauricular Area x1: Erythematous keratotic or waxy stuck-on papule or plaque.  Objective  Left Elbow - Posterior: Edematous nodule   Objective  Bilateral lower legs: Erythematous, ulcerated, hemorrhagic lesions  Assessment & Plan  Seborrheic dermatitis Scalp, ears Not doing well because not using consistently. Continue Dermasmoothe FS oil and Ketoconazole 2% shampoo alternating every other day. Reordered Medications Fluocinolone Acetonide 0.01 % OIL  Other Related Medications ketoconazole (NIZORAL) 2 % shampoo  Pilar cyst Left Parietal Scalp Plan surgical excision.   Seborrheic dermatitis of scalp Reordered Medications Fluocinolone Acetonide 0.01 % OIL Other Related Medications ketoconazole (NIZORAL) 2 % shampoo  Skin tag Bilateral Axillae Benign-appearing.  Observation.  Call clinic for new or changing moles.  Recommend daily use of broad spectrum spf 30+ sunscreen to sun-exposed areas.  Observe  Inflamed  seborrheic keratosis Left Preauricular Area x1  Destruction of lesion - Left Preauricular Area x1 Complexity: simple   Destruction method: cryotherapy   Informed consent: discussed and consent obtained   Timeout:  patient name, date of birth, surgical site, and procedure verified Lesion destroyed using liquid nitrogen: Yes   Region frozen until ice ball extended beyond lesion: Yes   Outcome: patient tolerated procedure well with no complications   Post-procedure details: wound care instructions given    Bursitis vs other of left elbow Left Elbow - Posterior Have PCP evaluate and PCP may - Plan referral to orthopedic surgeon.  Ambulatory referral to Orthopedic Surgery - Left Elbow - Posterior  Open wound - leg ulcers - associated with scars and history of chronic bone infection of legs. Bilateral lower legs Start Duoderm over Mupirocin (patient has), change every 3-4 days Cleansed with Puracyn today, Mupirocin and Duoderm applied today.   Return for surgery of cyst on scalp.   I, Emelia Salisbury, CMA, am acting as scribe for Sarina Ser, MD.  Documentation: I have reviewed the above documentation for accuracy and completeness, and I agree with the above.  Sarina Ser, MD

## 2020-06-12 DIAGNOSIS — L98499 Non-pressure chronic ulcer of skin of other sites with unspecified severity: Secondary | ICD-10-CM | POA: Diagnosis not present

## 2020-06-17 DIAGNOSIS — M25511 Pain in right shoulder: Secondary | ICD-10-CM | POA: Diagnosis not present

## 2020-06-17 DIAGNOSIS — M47816 Spondylosis without myelopathy or radiculopathy, lumbar region: Secondary | ICD-10-CM | POA: Diagnosis not present

## 2020-06-17 DIAGNOSIS — M79662 Pain in left lower leg: Secondary | ICD-10-CM | POA: Diagnosis not present

## 2020-06-17 DIAGNOSIS — G894 Chronic pain syndrome: Secondary | ICD-10-CM | POA: Diagnosis not present

## 2020-06-17 DIAGNOSIS — Z79891 Long term (current) use of opiate analgesic: Secondary | ICD-10-CM | POA: Diagnosis not present

## 2020-06-18 ENCOUNTER — Encounter: Payer: Self-pay | Admitting: Dermatology

## 2020-07-03 ENCOUNTER — Telehealth: Payer: Self-pay | Admitting: Internal Medicine

## 2020-07-03 ENCOUNTER — Telehealth: Payer: Medicare Other | Admitting: Internal Medicine

## 2020-07-03 NOTE — Telephone Encounter (Signed)
Noted  For your information   

## 2020-07-03 NOTE — Telephone Encounter (Signed)
Patient no-showed today's appointment; appointment was for 9/10 at 8:00 am, provider notified for review of record. Mychart sent to reschedule

## 2020-07-03 NOTE — Telephone Encounter (Signed)
Left message to return call. Will wait until 8:10am and then Patient will need to re-schedule their appointment

## 2020-07-03 NOTE — Telephone Encounter (Signed)
Left message to return call. Attempting to start 8:00 virtual visit.

## 2020-07-03 NOTE — Telephone Encounter (Signed)
Pt called back about appt.. gave him info below  Pt stated that his leg is doing better and didn't want to reschedule

## 2020-07-03 NOTE — Telephone Encounter (Signed)
Left message to return call. Calls going straight to voicemail

## 2020-07-23 ENCOUNTER — Other Ambulatory Visit: Payer: Medicare Other

## 2020-07-27 ENCOUNTER — Other Ambulatory Visit: Payer: Self-pay | Admitting: Internal Medicine

## 2020-07-27 DIAGNOSIS — M79675 Pain in left toe(s): Secondary | ICD-10-CM

## 2020-07-27 MED ORDER — DICLOFENAC SODIUM 1 % EX GEL: 2.0000 g | Freq: Four times a day (QID) | CUTANEOUS | 12 refills | Status: DC | PRN

## 2020-07-30 ENCOUNTER — Ambulatory Visit (INDEPENDENT_AMBULATORY_CARE_PROVIDER_SITE_OTHER): Payer: Medicare Other | Admitting: Internal Medicine

## 2020-07-30 ENCOUNTER — Other Ambulatory Visit: Payer: Self-pay

## 2020-07-30 ENCOUNTER — Encounter: Payer: Self-pay | Admitting: Internal Medicine

## 2020-07-30 VITALS — BP 128/70 | HR 83 | Temp 97.7°F | Ht 68.5 in | Wt 201.8 lb

## 2020-07-30 DIAGNOSIS — S81802A Unspecified open wound, left lower leg, initial encounter: Secondary | ICD-10-CM

## 2020-07-30 DIAGNOSIS — M86662 Other chronic osteomyelitis, left tibia and fibula: Secondary | ICD-10-CM | POA: Diagnosis not present

## 2020-07-30 DIAGNOSIS — M7022 Olecranon bursitis, left elbow: Secondary | ICD-10-CM

## 2020-07-30 DIAGNOSIS — Z23 Encounter for immunization: Secondary | ICD-10-CM

## 2020-07-30 DIAGNOSIS — G8929 Other chronic pain: Secondary | ICD-10-CM | POA: Diagnosis not present

## 2020-07-30 HISTORY — DX: Olecranon bursitis, left elbow: M70.22

## 2020-07-30 MED ORDER — COLLAGENASE 250 UNIT/GM EX OINT: 1.0000 "application " | TOPICAL_OINTMENT | Freq: Every day | CUTANEOUS | 0 refills | Status: DC

## 2020-07-30 MED ORDER — HIBICLENS 4 % EX LIQD: Freq: Every day | CUTANEOUS | 2 refills | Status: DC | PRN

## 2020-07-30 MED ORDER — SULFAMETHOXAZOLE-TRIMETHOPRIM 800-160 MG PO TABS: 1.0000 | ORAL_TABLET | Freq: Two times a day (BID) | ORAL | 0 refills | Status: DC

## 2020-07-30 MED ORDER — MUPIROCIN 2 % EX OINT: 1.0000 "application " | TOPICAL_OINTMENT | Freq: Three times a day (TID) | CUTANEOUS | 5 refills | Status: DC

## 2020-07-30 MED ORDER — METRONIDAZOLE 500 MG PO TABS: 500.0000 mg | ORAL_TABLET | Freq: Two times a day (BID) | ORAL | 0 refills | Status: DC

## 2020-07-30 NOTE — Patient Instructions (Addendum)
Use santyl at night x 1-2 weeks  Use bactroban 3x per day  Use hibiclens antibacterial soap daily  Cover daily  Take antibiotics  Follow up Dr. Theodoro Kos [place] to heal  Take oral antibiotics Bactrim 2x per day with food x 5-7 days Flagyl 2x per day x 7 days
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
## 2020-08-03 ENCOUNTER — Telehealth: Payer: Self-pay | Admitting: Internal Medicine

## 2020-08-03 ENCOUNTER — Telehealth: Payer: Self-pay | Admitting: *Deleted

## 2020-08-03 NOTE — Telephone Encounter (Signed)
Patient calling back in and was inquiring about having repeat labs. Patient's last labs showed elevated liver enzymes and Dr Olivia Mackie McLean-Scocuzza states she will monitor. Informed the Patient that she will check this again with routine labs. Patient's next follow up is 10/2020 and informed him labs could be repeated then.  Patient verbalized understanding

## 2020-08-03 NOTE — Progress Notes (Signed)
[facility] referral placed to [facility].

## 2020-08-03 NOTE — Telephone Encounter (Signed)
Pt wanted a call back to discuss his last appt on 07/30/20

## 2020-08-03 NOTE — Telephone Encounter (Signed)
-----   Message from Delorise Jackson, MD sent at 07/31/2020  5:15 PM EDT ----- Can we offer resources to the patient wife died in 2-3 months ago may need help with food, resources, [facility], thanksgiving or [employer] gifts

## 2020-08-03 NOTE — Telephone Encounter (Signed)
[facility] referral placed.

## 2020-08-04 ENCOUNTER — Telehealth: Payer: Self-pay | Admitting: Internal Medicine

## 2020-08-04 NOTE — Telephone Encounter (Signed)
Spoke with Dr Olivia Mackie McLean-Scocuzza she states that it is okay to place referral again.

## 2020-08-04 NOTE — Telephone Encounter (Signed)
Patient calling in today very upset and confused. States that he did not know what medication he was supposed to be taking, did not know what instructions to follow for the care of his leg, and over all just needing help.   Went over the steps and items he should be using to care for his left leg frontal calf wound with the Patient per his last visit. Patient was confused as he has also seen his dermatologist for the wound as well. Patient has received supplies and instructions from both offices on how to care for his leg.  Informed the Patient that since Dr Olivia Mackie McLean-Scocuzza was the last to evaluate the leg he should follow this set of instructions for the wound. The wound has healed quite a bit since he received those instruction from dermatology. Went over step by step instructions as well as what supplies to use 2-3 times with the patient.   Patient then started to cry as he states everything is so confusing and he is unsure of what to do. States he is having trouble paying his rent, paying his specialty copay's, unable to afford and manage his medications.  Informed the Patient that we had placed a referral for someone to reach out to him and help with resources.   Patient states someone from that office called him but he did not know who they were. Patient was very wary of speaking with them and at the time declined service. I spoke to the patient asking him about the specific problems he was having, as listed above. I informed the Patient that Dr Olivia Mackie McLean-Scocuzza did send that referral and she did want him to talk to the resource coordinators. Patient verbally gave consent and understanding that if we could get the referral back open he will talk to the representative and allow them to help. Patient states he was paranoid and agitated when speaking to the representative but now this was clarified he does want their help.   Sent a secure chat the to representative that called the Patient  asking her what she needed for [place] to do in order to get aide to this patient. Awaiting that reply.

## 2020-08-04 NOTE — Telephone Encounter (Signed)
Message to [place]. I am contacting you in regards to our patient Goldman Birchall. I know that Dr Terese Door has referred this patient to you all for resource help. I received a phone call from him oday and he was very confused, upset, and over all concerned because he does need help. I spoke with him about the referral being placed and why you all would be reaching out to him. Patient wanted me to apologize on his behalf on his behavior on the phone with you. The patient has been going through a lot since the death of his wife a few months prior. He states that he was unaware of the referral and who you all were when he received the call. The patient is currently struggling to afford housing, food, and his medical bills. He is needing all of the help he can get to manage his specialty care co pays, medication costs, rent, food, utilites and keeping up mentally with all of this. Is it possible to put in another referral to re open his case? I spoke with the patient and he states that should someone reach out to him again he is willing to accept the referral.  Message from Leafy Ro:  I am off at the moment. I will revisit this tomorrow. I am concerned that he didn't mention any of the things that you mentioned when I called him and asked if he needed any help. In fact he said that he had a fridge full of food and that he just needs someone to pay his truck payment. I am worried that even with conversations he may not be able to provide me with the information that I would need to help him with the programs that we have. I am addressing this with my co-workers and they are helping with what to do next. I will reach out to you tomorrow regarding this patient and see what we can do. Maybe I can corespond with his mother or ex-wife or someone that helps him in his everyday life? Please place another referral in the system.

## 2020-08-05 NOTE — Telephone Encounter (Signed)
Pt said someone was supposed to be calling him about the help he was supposed to be receiving. He said he never refused any help just didn't know what she was talking about when she called. He wants the lady to call him back because he does need the help.   He then said he needs to talk with someone about his leg again.

## 2020-08-05 NOTE — Telephone Encounter (Signed)
Pt said he took his bandage off and his leg is bleeding. He needs to know what to do about his leg. He has appointment this morning so he would like a call back around 11:30am.

## 2020-08-05 NOTE — Telephone Encounter (Signed)
Called and spoke with the Patient. He was informed that the referral for the resource department will have to be re-done. Informed the Patient that some one will be reaching out to him in the next few days.   Patient was informed of referral to plastic surgery for his leg wound. States that his leg was bleeding this morning but that has stopped. Number given to the patient so that he recognizes who is calling him.

## 2020-08-06 NOTE — Telephone Encounter (Signed)
Unable to re-enter referral as I do not have security clearance. Please re-enter this referral. Thank you!

## 2020-08-06 NOTE — Telephone Encounter (Signed)
CCM  referral completed.

## 2020-08-07 ENCOUNTER — Ambulatory Visit (INDEPENDENT_AMBULATORY_CARE_PROVIDER_SITE_OTHER): Payer: Medicare Other

## 2020-08-07 VITALS — Ht 68.5 in | Wt 201.0 lb

## 2020-08-07 DIAGNOSIS — Z0000 Encounter for general adult medical examination without abnormal findings: Secondary | ICD-10-CM | POA: Diagnosis not present

## 2020-08-07 NOTE — Patient Instructions (Addendum)
William Chase , Thank you for taking time to come for your Medicare Wellness Visit. I appreciate your ongoing commitment to your health goals. Please review the following plan we discussed and let me know if I can assist you in the future.   These are the goals we discussed: Goals      Patient Stated   .  Follow up with Primary Care Provider (pt-stated)      As needed       This is a list of the screening recommended for you and due dates:  Health Maintenance  Topic Date Due  . Colon Cancer Screening  Never done  . Tetanus Vaccine  06/25/2025  . Flu Shot  Completed  . COVID-19 Vaccine  Completed  .  Hepatitis C: One time screening is recommended by Center for Disease Control  (CDC) for  adults born from [birth year] through 1963.   Completed  . HIV Screening  Completed    Immunizations Immunization History  Administered Date(s) Administered  . Influenza Inj Mdck Quad With Preservative 08/20/2019  . Influenza,inj,Quad PF,6+ Mos 10/08/2015, 08/17/2016, 10/09/2017, 07/18/2018, 07/30/2020  . PFIZER SARS-COV-2 Vaccination 01/27/2020, 02/17/2020  . Pneumococcal Polysaccharide-23 10/08/2015  . Td 10/25/2003  . Tdap 06/26/2015   Advanced directives: not yet completed  Follow up in one year for your annual wellness visit.  Preventive Care 40-64 Years, Male Preventive care refers to lifestyle choices and visits with your health care provider that can promote health and wellness. What does preventive care include?  A yearly physical exam. This is also called an annual well check.  Dental exams once or twice a year.  Routine eye exams. Ask your health care provider how often you should have your eyes checked.  Personal lifestyle choices, including:  Daily care of your teeth and gums.  Regular physical activity.  Eating a healthy diet.  Avoiding tobacco and drug use.  Limiting alcohol use.  Practicing safe sex.  Taking low-dose aspirin every day starting at age 72. What  happens during an annual well check? The services and screenings done by your health care provider during your annual well check will depend on your age, overall health, lifestyle risk factors, and family history of disease. Counseling  Your health care provider may ask you questions about your:  Alcohol use.  Tobacco use.  Drug use.  Emotional well-being.  Home and relationship well-being.  Sexual activity.  Eating habits.  Work and work Statistician. Screening  You may have the following tests or measurements:  Height, weight, and BMI.  Blood pressure.  Lipid and cholesterol levels. These may be checked every 5 years, or more frequently if you are over 23 years old.  Skin check.  Lung cancer screening. You may have this screening every year starting at age 44 if you have a 30-pack-year history of smoking and currently smoke or have quit within the past 15 years.  Fecal occult blood test (FOBT) of the stool. You may have this test every year starting at age 39.  Flexible sigmoidoscopy or colonoscopy. You may have a sigmoidoscopy every 5 years or a colonoscopy every 10 years starting at age 47.  Prostate cancer screening. Recommendations will vary depending on your family history and other risks.  Hepatitis C blood test.  Hepatitis B blood test.  Sexually transmitted disease (STD) testing.  Diabetes screening. This is done by checking your blood sugar (glucose) after you have not eaten for a while (fasting). You may have this done every  1-3 years. Discuss your test results, treatment options, and if necessary, the need for more tests with your health care provider. Vaccines  Your health care provider may recommend certain vaccines, such as:  Influenza vaccine. This is recommended every year.  Tetanus, diphtheria, and acellular pertussis (Tdap, Td) vaccine. You may need a Td booster every 10 years.  Zoster vaccine. You may need this after age 58.  Pneumococcal  13-valent conjugate (PCV13) vaccine. You may need this if you have certain conditions and have not been vaccinated.  Pneumococcal polysaccharide (PPSV23) vaccine. You may need one or two doses if you smoke cigarettes or if you have certain conditions. Talk to your health care provider about which screenings and vaccines you need and how often you need them. This information is not intended to replace advice given to you by your health care provider. Make sure you discuss any questions you have with your health care provider. Document Released: 11/06/2015 Document Revised: 06/29/2016 Document Reviewed: 08/11/2015 Elsevier Interactive Patient Education  2017 [employer] Prevention in the Home Falls can cause injuries. They can happen to people of all ages. There are many things you can do to make your home safe and to help prevent falls. What can I do on the outside of my home?  Regularly fix the edges of walkways and driveways and fix any cracks.  Remove anything that might make you trip as you walk through a door, such as a raised step or threshold.  Trim any bushes or trees on the path to your home.  Use bright outdoor lighting.  Clear any walking paths of anything that might make someone trip, such as rocks or tools.  Regularly check to see if handrails are loose or broken. Make sure that both sides of any steps have handrails.  Any raised decks and porches should have guardrails on the edges.  Have any leaves, snow, or ice cleared regularly.  Use sand or salt on walking paths during winter.  Clean up any spills in your garage right away. This includes oil or grease spills. What can I do in the bathroom?  Use night lights.  Install grab bars by the toilet and in the tub and shower. Do not use towel bars as grab bars.  Use non-skid mats or decals in the tub or shower.  If you need to sit down in the shower, use a plastic, non-slip stool.  Keep the floor dry. Clean  up any water that spills on the floor as soon as it happens.  Remove soap buildup in the tub or shower regularly.  Attach bath mats securely with double-sided non-slip rug tape.  Do not have throw rugs and other things on the floor that can make you trip. What can I do in the bedroom?  Use night lights.  Make sure that you have a light by your bed that is easy to reach.  Do not use any sheets or blankets that are too big for your bed. They should not hang down onto the floor.  Have a firm chair that has side arms. You can use this for support while you get dressed.  Do not have throw rugs and other things on the floor that can make you trip. What can I do in the kitchen?  Clean up any spills right away.  Avoid walking on wet floors.  Keep items that you use a lot in easy-to-reach places.  If you need to reach something above you, use  a strong step stool that has a grab bar.  Keep electrical cords out of the way.  Do not use floor polish or wax that makes floors slippery. If you must use wax, use non-skid floor wax.  Do not have throw rugs and other things on the floor that can make you trip. What can I do with my stairs?  Do not leave any items on the stairs.  Make sure that there are handrails on both sides of the stairs and use them. Fix handrails that are broken or loose. Make sure that handrails are as long as the stairways.  Check any carpeting to make sure that it is firmly attached to the stairs. Fix any carpet that is loose or worn.  Avoid having throw rugs at the top or bottom of the stairs. If you do have throw rugs, attach them to the floor with carpet tape.  Make sure that you have a light switch at the top of the stairs and the bottom of the stairs. If you do not have them, ask someone to add them for you. What else can I do to help prevent falls?  Wear shoes that:  Do not have high heels.  Have rubber bottoms.  Are comfortable and fit you well.  Are  closed at the toe. Do not wear sandals.  If you use a stepladder:  Make sure that it is fully opened. Do not climb a closed stepladder.  Make sure that both sides of the stepladder are locked into place.  Ask someone to hold it for you, if possible.  Clearly mark and make sure that you can see:  Any grab bars or handrails.  First and last steps.  Where the edge of each step is.  Use tools that help you move around (mobility aids) if they are needed. These include:  Canes.  Walkers.  Scooters.  Crutches.  Turn on the lights when you go into a dark area. Replace any light bulbs as soon as they burn out.  Set up your furniture so you have a clear path. Avoid moving your furniture around.  If any of your floors are uneven, fix them.  If there are any pets around you, be aware of where they are.  Review your medicines with your doctor. Some medicines can make you feel dizzy. This can increase your chance of falling. Ask your doctor what other things that you can do to help prevent falls. This information is not intended to replace advice given to you by your health care provider. Make sure you discuss any questions you have with your health care provider. Document Released: 08/06/2009 Document Revised: 03/17/2016 Document Reviewed: 11/14/2014 Elsevier Interactive Patient Education  2017 [employer].
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
## 2020-08-11 ENCOUNTER — Encounter: Payer: Self-pay | Admitting: Dermatology

## 2020-08-11 ENCOUNTER — Ambulatory Visit (INDEPENDENT_AMBULATORY_CARE_PROVIDER_SITE_OTHER): Payer: Medicare Other | Admitting: Dermatology

## 2020-08-11 ENCOUNTER — Telehealth: Payer: Self-pay

## 2020-08-11 ENCOUNTER — Other Ambulatory Visit: Payer: Self-pay | Admitting: *Deleted

## 2020-08-11 ENCOUNTER — Other Ambulatory Visit: Payer: Self-pay

## 2020-08-11 DIAGNOSIS — L720 Epidermal cyst: Secondary | ICD-10-CM

## 2020-08-11 DIAGNOSIS — D2239 Melanocytic nevi of other parts of face: Secondary | ICD-10-CM | POA: Diagnosis not present

## 2020-08-11 DIAGNOSIS — L219 Seborrheic dermatitis, unspecified: Secondary | ICD-10-CM

## 2020-08-11 DIAGNOSIS — D229 Melanocytic nevi, unspecified: Secondary | ICD-10-CM

## 2020-08-11 DIAGNOSIS — L7211 Pilar cyst: Secondary | ICD-10-CM

## 2020-08-11 MED ORDER — KETOCONAZOLE 2 % EX CREA: 1.0000 "application " | TOPICAL_CREAM | CUTANEOUS | 3 refills | Status: DC

## 2020-08-11 MED ORDER — MUPIROCIN 2 % EX OINT: 1.0000 "application " | TOPICAL_OINTMENT | Freq: Every day | CUTANEOUS | 0 refills | Status: DC

## 2020-08-11 MED ORDER — HYDROCORTISONE 2.5 % EX LOTN: TOPICAL_LOTION | CUTANEOUS | 3 refills | Status: DC
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
## 2020-08-11 NOTE — Progress Notes (Signed)
Follow-Up Visit   Subjective  William Chase is a 54 y.o. male who presents for the following: Cyst (L parietal scalp, pt presents for excision). He also complains about a rash on his face.  He has other spots he would like checked.  The following portions of the chart were reviewed this encounter and updated as appropriate:  Tobacco  Allergies  Meds  Problems  Med Hx  Surg Hx  Fam Hx     Review of Systems:  No other skin or systemic complaints except as noted in HPI or Assessment and Plan.  Objective  Well appearing patient in no apparent distress; mood and affect are within normal limits.  A focused examination was performed including scalp, face. Relevant physical exam findings are noted in the Assessment and Plan.  Objective  Left parietal scalp: Cystic pap 3.2cm  Objective  L medial cheek: Flesh pap 0.5cm  Objective  face: Pink patches with greasy scale.    Assessment & Plan  Epidermal cyst Left parietal scalp  Cyst vs other, excision today  Start Mupirocin ointment qd with wound care  Skin excision - Left parietal scalp  Lesion length (cm):  3.2 Lesion width (cm):  3.2 Margin per side (cm):  0 Total excision diameter (cm):  3.2 Informed consent: discussed and consent obtained   Timeout: patient name, date of birth, surgical site, and procedure verified   Procedure prep:  Patient was prepped and draped in usual sterile fashion Prep type:  Isopropyl alcohol and povidone-iodine Anesthesia: the lesion was anesthetized in a standard fashion   Anesthetic:  1% lidocaine w/ epinephrine 1-100,000 buffered w/ 8.4% NaHCO3 (6.0cc) Instrument used: #15 blade   Hemostasis achieved with: pressure   Hemostasis achieved with comment:  Electrocautery Outcome: patient tolerated procedure well with no complications   Post-procedure details: sterile dressing applied and wound care instructions given   Dressing type: bandage and pressure dressing (Mupirocin)     Skin repair - Left parietal scalp Complexity:  Complex Final length (cm):  3.2 Reason for type of repair: reduce tension to allow closure, reduce the risk of dehiscence, infection, and necrosis, reduce subcutaneous dead space and avoid a hematoma, allow closure of the large defect, preserve normal anatomy, preserve normal anatomical and functional relationships and enhance both functionality and cosmetic results   Undermining: area extensively undermined   Undermining comment:  Undermining Defect 3.2 Subcutaneous layers (deep stitches):  Suture size:  3-0 Suture type: Vicryl (polyglactin 910)   Subcutaneous suture technique: Inverted Dermal. Fine/surface layer approximation (top stitches):  Suture size:  3-0 Suture type: nylon   Stitches: horizontal mattress   Suture removal (days):  7 Hemostasis achieved with: pressure Outcome: patient tolerated procedure well with no complications   Post-procedure details: sterile dressing applied and wound care instructions given   Dressing type: bandage, pressure dressing and bacitracin (Mupirocin)    mupirocin ointment (BACTROBAN) 2 % - Left parietal scalp  Specimen 1 - Surgical pathology Differential Diagnosis: D48.5 Cyst vs other Check Margins: No Cystic pap 3.2cm  Nevus L medial cheek  Benign appearing, discussed shave removal, will plan at post op appointment  Seborrheic dermatitis face  Start HC 2.5% lotion 3d/wk Monday, Wednesday, Friday to face Start Ketoconazole 2% cr 3d/wk Tuesday, Thursday, Saturday to face  Cont Ketoconazole 2% shampoo qod to scalp let sit several minutes and rinse out Cont Derma-Smooth FS oil qod to scalp  hydrocortisone 2.5 % lotion - face  ketoconazole (NIZORAL) 2 % cream - face  Other Related Medications ketoconazole (NIZORAL) 2 % shampoo Fluocinolone Acetonide 0.01 % OIL  Return in about 1 week (around 08/18/2020) for suture removal, bx/shave removal to nevus L medial cheek.   I, Othelia Pulling, RMA, am acting as scribe for Sarina Ser, MD .  Documentation: I have reviewed the above documentation for accuracy and completeness, and I agree with the above.  Sarina Ser, MD

## 2020-08-11 NOTE — Patient Instructions (Signed)

## 2020-08-11 NOTE — Telephone Encounter (Signed)
Left message for patient regarding surgery/hd  

## 2020-08-13 ENCOUNTER — Other Ambulatory Visit: Payer: Self-pay | Admitting: *Deleted

## 2020-08-14 ENCOUNTER — Other Ambulatory Visit: Payer: Self-pay | Admitting: *Deleted

## 2020-08-14 NOTE — Patient Outreach (Signed)
[place] [facility]) Care Management  08/14/2020  William Chase Apr 13, 1966 [id number]   CSW spoke with pt by phone on 08/13/2020.  Pt stating, "you were supposed to call me at 1:00 today".  CSW advised pt of the [facility] appointment times being an approximated time and that he can call me when/if needed.   Pt reports his roommate is supposed to get paid and pay him the rent money this week.  Pt also reports he will get his SS check next week but is concerned it will be depleted.  CSW reassured pt there may be some programs/support available to assist with his financial strains.  CSW will research and discuss further with team to ensure pt is provided with this info.   Eduard Clos, MSW, [place] Worker  [place] [phone]

## 2020-08-14 NOTE — Patient Outreach (Signed)
[place] [facility]) Care Management  08/14/2020  William Chase 05/04/1966 [id number]  CSW spoke with pt by phone. Pt reports his roommate has not paid him (rent) yet. CSW encouraged pt to outreach some of the grant funded programs offering assistance as well as to apply for [employer] and Physicist, medical at local [place] office. "I get paid next week".   Pt denies any current needs or concerns related to his mental health and is in good spirits.     CSW will follow up with pt again next week.   Eduard Clos, MSW, [place] Worker  [place] [phone]

## 2020-08-18 ENCOUNTER — Other Ambulatory Visit: Payer: Self-pay | Admitting: *Deleted

## 2020-08-18 ENCOUNTER — Other Ambulatory Visit: Payer: Self-pay

## 2020-08-18 ENCOUNTER — Ambulatory Visit (INDEPENDENT_AMBULATORY_CARE_PROVIDER_SITE_OTHER): Payer: Medicare Other | Admitting: Dermatology

## 2020-08-18 DIAGNOSIS — D485 Neoplasm of uncertain behavior of skin: Secondary | ICD-10-CM

## 2020-08-18 DIAGNOSIS — L7211 Pilar cyst: Secondary | ICD-10-CM

## 2020-08-18 DIAGNOSIS — D2239 Melanocytic nevi of other parts of face: Secondary | ICD-10-CM | POA: Diagnosis not present

## 2020-08-18 DIAGNOSIS — S81802A Unspecified open wound, left lower leg, initial encounter: Secondary | ICD-10-CM

## 2020-08-18 DIAGNOSIS — T148XXA Other injury of unspecified body region, initial encounter: Secondary | ICD-10-CM

## 2020-08-18 NOTE — Patient Instructions (Signed)

## 2020-08-18 NOTE — Progress Notes (Signed)
   Follow-Up Visit   Subjective  William Chase is a 54 y.o. male who presents for the following: Follow-up (Post op of left parietal scalp - Cyst), Nevus (of left medial cheek - shave removal today), and Wound Check (L lower leg, pt has seen PCP and she is referring him to Plastic surgery).  The following portions of the chart were reviewed this encounter and updated as appropriate:  Tobacco  Allergies  Meds  Problems  Med Hx  Surg Hx  Fam Hx     Review of Systems:  No other skin or systemic complaints except as noted in HPI or Assessment and Plan.  Objective  Well appearing patient in no apparent distress; mood and affect are within normal limits.  A focused examination was performed including scalp, face, L lower leg. Relevant physical exam findings are noted in the Assessment and Plan.  Objective  Left parietal scalp: Healing excision site  Objective  Left medial cheek: 0.7 cm flesh papule  Objective  Left Lower Leg - Anterior: Wound not examined today   Assessment & Plan  Pilar cyst post op excision Left parietal scalp Bx proven  Healing well  Wound cleansed, sutures removed, wound cleansed and steri strips applied. Discussed pathology results.   Neoplasm of uncertain behavior of skin Left medial cheek Epidermal / dermal shaving  Lesion diameter (cm):  0.7 Informed consent: discussed and consent obtained   Timeout: patient name, date of birth, surgical site, and procedure verified   Procedure prep:  Patient was prepped and draped in usual sterile fashion Prep type:  Isopropyl alcohol Anesthesia: the lesion was anesthetized in a standard fashion   Anesthetic:  1% lidocaine w/ epinephrine 1-100,000 buffered w/ 8.4% NaHCO3 Instrument used: flexible razor blade   Hemostasis achieved with: pressure, aluminum chloride and electrodesiccation   Outcome: patient tolerated procedure well   Post-procedure details: sterile dressing applied and wound care  instructions given   Dressing type: bandage and petrolatum    Specimen 1 - Surgical pathology Differential Diagnosis: D48.5 Irritated Nevus r/o Dysplasia Check Margins: yes 0.7 cm flesh papule  Open wound Left Lower Leg - Anterior Previously saw pt 06/11/20 with hx of open wound.   Pt then saw his PCP Dr. Olivia Mackie McLean-Scocuzza 07/30/2020 and her Impression/plan was the following: Open wound of left lower extremity, initial encounter - Plan: chlorhexidine (HIBICLENS) 4 % external liquid, Ambulatory referral to Plastic Surgery, sulfamethoxazole-trimethoprim (BACTRIM DS) 800-160 MG tablet, metroNIDAZOLE (FLAGYL) 500 MG tablet x 1 week  Chronic osteomyelitis of left lower leg (HCC) - Plan: mupirocin ointment (BACTROBAN) 2 %, chlorhexidine (HIBICLENS) 4 % external liquid, Ambulatory referral to Plastic Surgery, sulfamethoxazole-trimethoprim (BACTRIM DS) 800-160 MG tablet, metroNIDAZOLE (FLAGYL) 500 MG tablet  Discussed with pt that his PCP has referred him to plastic surgery and Plastic Surgery would continue care of this wound  Return in about 6 months (around 02/16/2021).   I, Othelia Pulling, RMA, am acting as scribe for Sarina Ser, MD .  Documentation: I have reviewed the above documentation for accuracy and completeness, and I agree with the above.  Sarina Ser, MD

## 2020-08-18 NOTE — Patient Outreach (Signed)
[facility]) Care Management  08/18/2020  FABIAN WALDER 30-Nov-1965 [id number]   CSW spoke with pt who reports he went to the "skin doctor to get stitches out".  He was able to get a little bit of the rent money from his roommate; however, CSW encouraged pt to apply for Medicaid and food stamps given his income and the uncertainty of the roommate staying and/or paying rent.   CSW provided pt with the address for DSS.  Pt reports his depression is "the same".  He is calm, appropriate and voices no current concerns related to his mental health state. CSW reminded pt of the bereavement counseling program being offered at the local Hospice office and has mailed info to him as well.     CSW will plan to touch base with pt again later this week.  CSW reminded pt to call if needs arise prior to follow up call.     Eduard Clos, MSW, [place] Worker  [place] [phone]

## 2020-08-19 ENCOUNTER — Encounter: Payer: Self-pay | Admitting: Dermatology

## 2020-08-20 DIAGNOSIS — M25511 Pain in right shoulder: Secondary | ICD-10-CM | POA: Diagnosis not present

## 2020-08-20 DIAGNOSIS — G894 Chronic pain syndrome: Secondary | ICD-10-CM | POA: Diagnosis not present

## 2020-08-20 DIAGNOSIS — M79662 Pain in left lower leg: Secondary | ICD-10-CM | POA: Diagnosis not present

## 2020-08-20 DIAGNOSIS — M47816 Spondylosis without myelopathy or radiculopathy, lumbar region: Secondary | ICD-10-CM | POA: Diagnosis not present

## 2020-08-21 ENCOUNTER — Telehealth: Payer: Self-pay

## 2020-08-21 ENCOUNTER — Other Ambulatory Visit: Payer: Self-pay

## 2020-08-21 ENCOUNTER — Ambulatory Visit: Payer: Self-pay | Admitting: *Deleted

## 2020-08-21 ENCOUNTER — Ambulatory Visit (INDEPENDENT_AMBULATORY_CARE_PROVIDER_SITE_OTHER): Payer: Medicare Other | Admitting: Plastic Surgery

## 2020-08-21 ENCOUNTER — Encounter: Payer: Self-pay | Admitting: Plastic Surgery

## 2020-08-21 VITALS — BP 96/61 | HR 86 | Temp 98.7°F | Ht 68.0 in | Wt 202.0 lb

## 2020-08-21 DIAGNOSIS — F419 Anxiety disorder, unspecified: Secondary | ICD-10-CM | POA: Diagnosis not present

## 2020-08-21 DIAGNOSIS — S81802A Unspecified open wound, left lower leg, initial encounter: Secondary | ICD-10-CM | POA: Insufficient documentation

## 2020-08-21 DIAGNOSIS — M79605 Pain in left leg: Secondary | ICD-10-CM | POA: Diagnosis not present

## 2020-08-21 DIAGNOSIS — I10 Essential (primary) hypertension: Secondary | ICD-10-CM

## 2020-08-21 DIAGNOSIS — F319 Bipolar disorder, unspecified: Secondary | ICD-10-CM

## 2020-08-21 DIAGNOSIS — F32A Depression, unspecified: Secondary | ICD-10-CM

## 2020-08-21 DIAGNOSIS — R413 Other amnesia: Secondary | ICD-10-CM | POA: Diagnosis not present

## 2020-08-21 DIAGNOSIS — E079 Disorder of thyroid, unspecified: Secondary | ICD-10-CM

## 2020-08-21 NOTE — Telephone Encounter (Signed)
Faxed order to [facility]: change everyday: Kerlix, 4x4, Every 3 days: ace wrap, Endoform: every other day
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
## 2020-08-21 NOTE — Telephone Encounter (Signed)
Patient left voice mail to let Dr. Marla Roe know that he has been applying  DuoDERM extra thin dressing to his wound.

## 2020-08-24 ENCOUNTER — Telehealth: Payer: Self-pay

## 2020-08-24 ENCOUNTER — Other Ambulatory Visit: Payer: Self-pay | Admitting: *Deleted

## 2020-08-24 DIAGNOSIS — S81802A Unspecified open wound, left lower leg, initial encounter: Secondary | ICD-10-CM | POA: Diagnosis not present

## 2020-08-24 NOTE — Telephone Encounter (Signed)
Routed to Dr. Marla Roe.

## 2020-08-24 NOTE — Patient Outreach (Signed)
William Chase) Care Management  08/24/2020  William Chase 12-Oct-1966 [id number]   CSW spoke with pt who reports his ex-wife was finishing the Medicaid application.  Pt is doing ok; depression wise today- stating, "it comes and goes".  He is considering counseling/therapy as well as the hospice programs grief support.  CSW encouraged him to reach out if needed prior to CSW follow up call.   Eduard Clos, MSW, LCSW Clinical Social Worker  [name], MSW, [place] Worker  [place] [phone]

## 2020-08-24 NOTE — Telephone Encounter (Signed)
Advised patient of results/hd  

## 2020-08-24 NOTE — Telephone Encounter (Signed)
-----   Message from Ralene Bathe, MD sent at 08/23/2020  6:54 PM EDT ----- Diagnosis Skin , left medial cheek MELANOCYTIC NEVUS, INTRADERMAL TYPE  Benign mole

## 2020-08-25 ENCOUNTER — Telehealth: Payer: Self-pay

## 2020-08-25 NOTE — Telephone Encounter (Signed)
Returned patients call. LMVM to call our office back. Advised Dr. Marla Roe that patient currently is using DuoDerm.  He is to use only what Dr. Marla Roe ordered for medical supplies. No ointment nor ABX. He is to use endoform: change every other day. 4x 4 gauze and Kerlix: change everyday. Change ace wrap every 3 days.  Ace wrap should start from the toes and work its way to the knee.

## 2020-08-25 NOTE — Telephone Encounter (Signed)
Patient called to state he has received his wound care supplies; however, he does not have instructions on how to apply the bandages. He also mentioned not having an ointment to apply to the wound or any antibiotics to take.

## 2020-08-25 NOTE — Telephone Encounter (Signed)
Spoke with patient and advised he does not need any ointment nor ABX. He is to use endoform: change every other day. 4x 4 gauze and Kerlix: change everyday. Change ace wrap every 3 days.  Ace wrap should start from the toes and work its way to the knee. Requested for patient to write instructions down to help him remember. If he has any other questions, he can call the office back. Patient understood and agreed.

## 2020-08-26 ENCOUNTER — Other Ambulatory Visit: Payer: Self-pay

## 2020-08-26 DIAGNOSIS — J209 Acute bronchitis, unspecified: Secondary | ICD-10-CM

## 2020-08-26 MED ORDER — FLUTICASONE-SALMETEROL 100-50 MCG/DOSE IN AEPB: INHALATION_SPRAY | RESPIRATORY_TRACT | 2 refills | Status: DC

## 2020-08-28 ENCOUNTER — Other Ambulatory Visit: Payer: Self-pay | Admitting: *Deleted

## 2020-08-29 NOTE — Patient Outreach (Signed)
[facility]) Care Management  08/29/2020  JOCSAN MCGINLEY [birth year] [id number]   CSW spoke with pt by phone on 08/28/2020.  Pt was asleep and denies any current concerns.  CSW will check with pt's ex-wife for update on Medicaid application status.  CSW advised pt to call CSW if needs arise prior to follow up call next week.   Eduard Clos, MSW, [place] Worker  [place] [phone]

## 2020-08-31 ENCOUNTER — Telehealth: Payer: Self-pay

## 2020-08-31 NOTE — Telephone Encounter (Signed)
Returned pt's call regarding his concern with the following: He reports that he continues to change the 4x4 gauze dressing daily, but when he attempts to change or add another layer of Endoform into the wound bed- which he is doing every other day as directed by Dr. Marla Roe- he notes that the Endoform is completely gone. He also states he has more "burning/pain" with the Endoform. He denies any chills/fever, no nausea/vomit. He is eating/drinking & normal bowel/bladder functions. He does notice drainage on the 4x4 gauze pads with daily dressing changes- but does not have to change it more than once daily. I did inform him that the goal is for the  Endoform  to be incorporated into the wound.  Pt will attempt to send new photo through "mychart" & I will consult with Dr.Dillingham for any change in the plan of care. I will call him tomorrow. He will continue wound/care as ordered for now. He has f/u visit with [place], [place] on 09/08/20

## 2020-08-31 NOTE — Telephone Encounter (Signed)
Patient called to let us know that when he goes to take the 4-inch pad off, his leg starts bleeding.  He said it looks like the wound is eating up the endoform.  Please call.

## 2020-09-02 ENCOUNTER — Ambulatory Visit: Payer: Self-pay | Admitting: *Deleted

## 2020-09-03 ENCOUNTER — Other Ambulatory Visit: Payer: Self-pay | Admitting: *Deleted

## 2020-09-03 ENCOUNTER — Telehealth: Payer: Self-pay

## 2020-09-03 NOTE — Telephone Encounter (Signed)
Call returned to pt: Pt reports that the "Endoform" material that he is using in the wound bed- is being  "absorbed" within a day of using it & he was instructed to use it ever other day.  He also states that when he tries to remove any residual endoform surrounding the wound it sometimes adheres to the area & causes minor bleeding when removed. He had indicated that the endoform had also caused some discomfort/burning sensation. He is attempting to send pictures of the wound via "mychart" for review.\ I informed him that I did consult with Dr. Marla Roe about his concerns & she suggested if he is having bleeding or other complication with the endoform- that he can d/c using it & use his previous product ( Colactive Plus) until he is seen here at his next f/u visit on 09/08/20. We discussed in length all the products he has at home & I did advise him to not use any of the "antibiotic" ointments/lotions until he is evaluated here. He will use saline to help remove the dressings if adhered and dress the wound with 4x4 gauze/bandaids/kerlix/ace as directed. Because he has so many different dressings/products on hand- I instructed him to bring them with him on his f/u visit so the provider can help in instructing him on plan of care after assessing the site. He understands the plan of care.

## 2020-09-03 NOTE — Patient Outreach (Signed)
[facility]) Care Management  09/03/2020  William Chase [birth year] [id number]   CSW made contact with pt and was unable to reach.  CSW left a HIPPA compliant voice message and will await callback or try again in 3-4 business days.   Eduard Clos, MSW, [place] Worker  [place] [phone]

## 2020-09-04 ENCOUNTER — Other Ambulatory Visit: Payer: Self-pay | Admitting: *Deleted

## 2020-09-04 NOTE — Patient Outreach (Signed)
[facility]) Care Management  09/04/2020  William Chase 04/09/1966 [id number]   CSW spoke with pt who reports he remembered his deceased wife earlier this week on the anniversary.  "I went out to eat at one of our favorite spots".  CSW allowed pt to share and reminisce about his wife and their special times. Pt was appropriately tearful.  CSW reminded him of the grief counseling options available. Pt prefers to "stick with my Psychiatrist".  He has upcoming visit in the next few months.  Pt's ex-wife has also assisted with Medicaid application and pt reports knowing it can take up to 45 days to be processed once application is received/complete.   Pt also reports he has had some positive interactions with his roommate; however states the roommates pregnant wife and baby have also moved in.  "I didn't know she was coming too".  CSW encouraged pt to feel empowered and to speak up about the additional tenants and agreements.    CSW has discussed plans to sign off with Supervisor and will alert PCP office/team of above for follow up.  CSW reminded pt to call if needs arise.   Eduard Clos, MSW, [place] Worker  [place] [phone]

## 2020-09-08 ENCOUNTER — Ambulatory Visit (INDEPENDENT_AMBULATORY_CARE_PROVIDER_SITE_OTHER): Payer: Medicare Other | Admitting: Surgical

## 2020-09-08 ENCOUNTER — Encounter: Payer: Self-pay | Admitting: Surgical

## 2020-09-08 ENCOUNTER — Other Ambulatory Visit: Payer: Self-pay

## 2020-09-08 VITALS — BP 107/58 | HR 96 | Temp 98.0°F

## 2020-09-08 DIAGNOSIS — S81802A Unspecified open wound, left lower leg, initial encounter: Secondary | ICD-10-CM | POA: Diagnosis not present

## 2020-09-08 DIAGNOSIS — F319 Bipolar disorder, unspecified: Secondary | ICD-10-CM | POA: Diagnosis not present

## 2020-09-08 DIAGNOSIS — R413 Other amnesia: Secondary | ICD-10-CM | POA: Diagnosis not present

## 2020-09-08 DIAGNOSIS — M79605 Pain in left leg: Secondary | ICD-10-CM

## 2020-09-08 DIAGNOSIS — F32A Depression, unspecified: Secondary | ICD-10-CM

## 2020-09-08 DIAGNOSIS — F419 Anxiety disorder, unspecified: Secondary | ICD-10-CM | POA: Diagnosis not present

## 2020-09-08 NOTE — Progress Notes (Signed)
Subjective:     Patient ID: William Chase, male    DOB: Jul 27, 1966, 54 y.o.   MRN: [id number]  Chief Complaint  Patient presents with  . Follow-up    HPI: The patient is a 54 y.o. male here for follow-up to evaluate his left lower extremity wound.  He was seen by Dr. Marla Roe on 08/21/2020 for evaluation of this.  It was recommended at that time that he start with endoform dressing changes every other day.  It was recommended patient be evaluated by orthopedics as well, however patient declined that at that time.  Patient is frustrated and also sad today for multiple reasons.  He reports that he has had some troubles with his roommate and also reports that he lost his wife in June of this year.  He also reports that he is frustrated with his wound care treatment plan as he has been seeing multiple doctors/providers who have recommended multiple different options for treating his left lower extremity wounds. He reports he has received guidance from skin care clinic in [place] as well as from his PCP.  Per EMR review patient last saw skin care clinic prior to his evaluation with our team and it was noted that patient was coming to [place] for evaluation.  He reports that this morning he noticed a lot of blood on his leg when waking up and the sheets of his bed.  He is unsure how this happened.  He reports that he has been applying Vaseline to the most proximal left lower extremity wound.  It is unclear what he has been applying to the distal left lower extremity wound, he may have been applying the DuoDERM, but it is unclear to me based on patient's explanation/description.  Review of Systems  Constitutional: Negative.   Skin: Positive for wound. Negative for color change.     Objective:   Vital Signs BP (!) 107/58 (BP Location: Left Arm, Patient Position: Sitting, Cuff Size: Large)   Pulse 96   Temp 98 F (36.7 C) (Oral)   SpO2 98%  Vital Signs and Nursing Note Reviewed Physical  Exam Skin:      Neurological:     Mental Status: He is alert.  Psychiatric:     Comments: Frustrated/tearful    Distal:                                                                     proximal:      Assessment/Plan:     ICD-10-CM   1. Anxiety and depression  F41.9    F32.A   2. Memory loss  R41.3   3. Pain of left lower extremity  M79.605   4. Bipolar affective disorder, remission status unspecified ([place])  F31.9   5. Wound of left lower extremity, initial encounter  S81.802A     Discussed with the patient that our recommendations are to continue using endoform. Today I went through the treatment plan with him and recommended he use the endoform dressing changes every other day.  Change the gauze and the kerlix dressing daily.  He can reuse Ace wraps for up to 3 days or until soiled.  Patient seemed to be clear on the wound care instructions, he also has the  instructions written down on a pad for himself.  I discussed with the patient that he should not be applying any mupirocin cream or DuoDERM over the wound.  Today in the office patient did not have any endoform, he did have collagen.  Therefore I applied some collagen to the proximal and distal left lower extremity wound but instructed him to start using endoform tomorrow.  Recommend calling with questions or concerns. Follow up scheduled for 1 month for reevaluation.  I recommend he calls [place] with any changes or worsening symptoms.  Pictures were obtained of the patient and placed in the chart with the patient's or guardian's permission.    Carola Rhine Evva Din, PA-C 09/08/2020, 4:04 PM

## 2020-09-09 ENCOUNTER — Telehealth: Payer: Self-pay | Admitting: Surgical

## 2020-09-09 ENCOUNTER — Telehealth: Payer: Self-pay

## 2020-09-09 NOTE — Telephone Encounter (Signed)
Called patient to discuss some questions he had, did not specify questions to front desk team. Informed him to call and discuss with nursing staff and I would be happy to call him back

## 2020-09-09 NOTE — Telephone Encounter (Signed)
Patient called to say he is supposed to get some stuff for his leg.  Patient seems confused about wound care.  Please call.

## 2020-09-10 NOTE — Telephone Encounter (Addendum)
Called and spoke with the patient and informed him that I called [facility] and was informed that they did not send him Endoform.  They stated that his insurance will not cover the Endoform,so they send him Colactive Plus Collagen.    Informed him that I spoke with [facility] and informed him that he was sent Collagen and not Endoform.  Rodman Key, PA-C would like for him to use the Collagen instead.  Patient verbalized understanding and agreed.  Patient verbalized he needed more supplies.  Informed him that I will let Rodman Key know and we will order more supplies for him.//AB/CMA

## 2020-09-14 ENCOUNTER — Telehealth: Payer: Self-pay

## 2020-09-14 ENCOUNTER — Telehealth: Payer: Self-pay | Admitting: Surgical

## 2020-09-14 NOTE — Telephone Encounter (Signed)
Pt would like to know why he isn't on antibiotics for his leg. I asked him who is taking care of his leg now and he said the plastic surgeon and dermatologist was. I asked him to give them a call also but he said Dr. Olivia Mackie is his primary and she is the only one that put him on an antibiotic and he believes he needs to be back on them. He would like a call back.

## 2020-09-14 NOTE — Telephone Encounter (Signed)
Patient called to see why he was not on an antibiotic for his leg. Please call to advise.

## 2020-09-15 NOTE — Telephone Encounter (Signed)
Called and spoke with the patient regarding the message below.  Informed the patient that I informed Matthew,PA-C of his message and he stated that the wound is not infected so he does not need to be on any Antibiotics.  Asked the patient is he is having any symptoms like fever,chills,nauea,or vomiting.  Patent stated that he's not having any symptoms, he's feeling fine.    Patient stated that something was eating up the dressing.  Asked the patient what was he using for the dressing?  He stated he's using (4x4,Collagen,gauze wrap, and ace wrap).  He states when he takes the 4x4 off the Collagen is gone except from around the wound.    He said the wound is still wet and not drying up, and nothing has changed since he has seen Dr. Marla Roe.  Patient stated if he does not need an Antibiotic why is he wrapping his leg up.    Informed the patient that I spoke with Matthew,PA-C again and he stated that the Collagen does go into the wound, and it's helping the wound to heal, and wrapping the leg is keeping bacteria from getting in it.    Patient verbalized understanding and agreed and said alright.//AB/CMA

## 2020-09-15 NOTE — Telephone Encounter (Signed)
Patient states he has spoke with his dermatologist and plastic surgeon. States he feel that is leg isn't much better but states they say it is no longer infected. He will continue to follow their instructions

## 2020-10-09 ENCOUNTER — Encounter: Payer: Self-pay | Admitting: Surgical

## 2020-10-09 ENCOUNTER — Ambulatory Visit (INDEPENDENT_AMBULATORY_CARE_PROVIDER_SITE_OTHER): Payer: Medicare Other | Admitting: Surgical

## 2020-10-09 ENCOUNTER — Other Ambulatory Visit: Payer: Self-pay

## 2020-10-09 VITALS — BP 114/66 | HR 84 | Temp 97.8°F

## 2020-10-09 DIAGNOSIS — R413 Other amnesia: Secondary | ICD-10-CM | POA: Diagnosis not present

## 2020-10-09 DIAGNOSIS — S81802A Unspecified open wound, left lower leg, initial encounter: Secondary | ICD-10-CM

## 2020-10-09 NOTE — Progress Notes (Signed)
   Subjective:     Patient ID: William Chase, male    DOB: 08-17-1966, 54 y.o.   MRN: [id number]  Chief Complaint  Patient presents with  . Follow-up    HPI: The patient is a 54 y.o. male here for follow-up on his left lower extremity wound.  He reports that he has been applying collagen daily.  He reports that he has been applying new collagen daily due to the collagen being "aten up" in 1 day.  He reports that otherwise he is doing well.  He reports that he is having some bone pain, but he is not having any pain along the wound.  He has been previously recommended to be evaluated by orthopedics, however he continues to decline a referral at this time.  He reports that he is pleased with how things have been healing.  He reports that he needs more collagen.  He reports that he is going to call [facility] to discuss this.  He is not having any fevers, chills, nausea, vomiting.  Review of Systems  Constitutional: Negative.   Skin: Positive for wound.     Objective:   Vital Signs BP 114/66 (BP Location: Left Arm, Patient Position: Sitting, Cuff Size: Large)   Pulse 84   Temp 97.8 F (36.6 C) (Oral)   SpO2 96%  Vital Signs and Nursing Note Reviewed  Physical Exam Constitutional:      Appearance: Normal appearance.  HENT:     Head: Normocephalic and atraumatic.  Skin:    General: Skin is warm.     Capillary Refill: Capillary refill takes less than 2 seconds.       Neurological:     General: No focal deficit present.     Mental Status: He is alert and oriented to person, place, and time.  Psychiatric:        Mood and Affect: Mood normal.        Behavior: Behavior normal.         Assessment/Plan:     ICD-10-CM   1. Memory loss  R41.3   2. Wound of left lower extremity, initial encounter  S81.802A     Recommend continue with collagen dressing changes daily or every other day depending on incorporation rate.  Recommend calling present to discuss additional shipment for  wound care supplies.  We wrapped the leg today with Adaptic, 4 x 4 gauze, Kerlix, Ace wrap.  Recommend changing this evening or tomorrow and applying collagen.  There is no sign of infection.  Recommend following up in 3 weeks for reevaluation.  Call with any questions or concerns or symptoms worsen or change.  Of note, it appears he had a setback from his visit on 10/29 to 11/16 per review of EMR photos.  The wound was initially smaller, similar to today size, however the area was significantly larger on his last evaluation due to an unknown event.  He reports he woke up with the wound appearing the way it did on 09/08/2020.  Pictures were obtained of the patient and placed in the chart with the patient's or guardian's permission.   Carola Rhine Rayli Wiederhold, PA-C 10/09/2020, 9:26 AM

## 2020-10-21 DIAGNOSIS — M79662 Pain in left lower leg: Secondary | ICD-10-CM | POA: Diagnosis not present

## 2020-10-21 DIAGNOSIS — G894 Chronic pain syndrome: Secondary | ICD-10-CM | POA: Diagnosis not present

## 2020-10-21 DIAGNOSIS — M47816 Spondylosis without myelopathy or radiculopathy, lumbar region: Secondary | ICD-10-CM | POA: Diagnosis not present

## 2020-10-21 DIAGNOSIS — M25511 Pain in right shoulder: Secondary | ICD-10-CM | POA: Diagnosis not present

## 2020-10-29 ENCOUNTER — Other Ambulatory Visit: Payer: Self-pay

## 2020-10-29 ENCOUNTER — Encounter: Payer: Self-pay | Admitting: Surgical

## 2020-10-29 ENCOUNTER — Ambulatory Visit (INDEPENDENT_AMBULATORY_CARE_PROVIDER_SITE_OTHER): Payer: Medicare Other | Admitting: Surgical

## 2020-10-29 VITALS — BP 109/72 | HR 86 | Temp 98.6°F

## 2020-10-29 DIAGNOSIS — S81802A Unspecified open wound, left lower leg, initial encounter: Secondary | ICD-10-CM | POA: Diagnosis not present

## 2020-10-29 DIAGNOSIS — R413 Other amnesia: Secondary | ICD-10-CM
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
## 2020-10-30 ENCOUNTER — Encounter: Payer: Self-pay | Admitting: Internal Medicine

## 2020-10-30 ENCOUNTER — Ambulatory Visit (INDEPENDENT_AMBULATORY_CARE_PROVIDER_SITE_OTHER): Payer: Medicare Other | Admitting: Internal Medicine

## 2020-10-30 VITALS — BP 98/60 | HR 80 | Temp 97.4°F | Ht 68.0 in | Wt 205.4 lb

## 2020-10-30 DIAGNOSIS — I10 Essential (primary) hypertension: Secondary | ICD-10-CM

## 2020-10-30 DIAGNOSIS — M8660 Other chronic osteomyelitis, unspecified site: Secondary | ICD-10-CM | POA: Diagnosis not present

## 2020-10-30 MED ORDER — LISINOPRIL-HYDROCHLOROTHIAZIDE 10-12.5 MG PO TABS: 0.5000 | ORAL_TABLET | Freq: Every day | ORAL | 3 refills | Status: DC

## 2020-10-30 MED ORDER — ONE-A-DAY MENS PO TABS: 1.0000 | ORAL_TABLET | Freq: Every day | ORAL | 3 refills | Status: AC

## 2020-10-30 NOTE — Patient Instructions (Addendum)
Diagnoses  Colon cancer screening   [place], [place] [address], [place] [address]  Phone: [phone]  Fax: [phone]  Kathline Magic, MD  [place]  [place], [place] [address]  Phone: [phone]  Fax: [phone]    Colonoscopy 11/04/20 (Wednesday)  Call to figure out where to go for covid test 11/02/20 8:35 in am Monday -call to ask where to go for both # listed above    Centrum for men multivitamin
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
## 2020-11-02 ENCOUNTER — Other Ambulatory Visit
Admission: RE | Admit: 2020-11-02 | Discharge: 2020-11-02 | Disposition: A | Discharge status: Home / Self Care | Payer: Medicare Other | Source: Ambulatory Visit | Attending: Internal Medicine | Admitting: Internal Medicine

## 2020-11-02 ENCOUNTER — Other Ambulatory Visit: Payer: Self-pay

## 2020-11-02 DIAGNOSIS — Z01812 Encounter for preprocedural laboratory examination: Secondary | ICD-10-CM | POA: Diagnosis not present

## 2020-11-02 DIAGNOSIS — Z20822 Contact with and (suspected) exposure to covid-19: Secondary | ICD-10-CM | POA: Diagnosis not present

## 2020-11-02 LAB — SARS CORONAVIRUS 2 (TAT 6-24 HRS): SARS Coronavirus 2: NEGATIVE

## 2020-11-04 ENCOUNTER — Encounter: Admission: RE | Payer: Self-pay | Source: Home / Self Care

## 2020-11-04 ENCOUNTER — Ambulatory Visit: Admission: RE | Admit: 2020-11-04 | Payer: Medicare Other | Source: Home / Self Care | Admitting: Internal Medicine

## 2020-11-04 SURGERY — COLONOSCOPY
Anesthesia: General

## 2020-11-26 ENCOUNTER — Other Ambulatory Visit: Payer: Self-pay | Admitting: Internal Medicine

## 2020-11-26 DIAGNOSIS — I10 Essential (primary) hypertension: Secondary | ICD-10-CM
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
## 2020-12-01 ENCOUNTER — Encounter: Payer: Self-pay | Admitting: Plastic Surgery

## 2020-12-01 ENCOUNTER — Ambulatory Visit (INDEPENDENT_AMBULATORY_CARE_PROVIDER_SITE_OTHER): Payer: Medicare Other | Admitting: Plastic Surgery

## 2020-12-01 ENCOUNTER — Other Ambulatory Visit: Payer: Self-pay

## 2020-12-01 VITALS — BP 121/65 | HR 98

## 2020-12-01 DIAGNOSIS — S81802D Unspecified open wound, left lower leg, subsequent encounter: Secondary | ICD-10-CM

## 2020-12-17 ENCOUNTER — Emergency Department
Admission: EM | Admit: 2020-12-17 | Discharge: 2020-12-17 | Disposition: A | Discharge status: Home / Self Care | Payer: Medicare Other | Attending: Emergency Medicine | Admitting: Emergency Medicine

## 2020-12-17 ENCOUNTER — Other Ambulatory Visit: Payer: Self-pay

## 2020-12-17 DIAGNOSIS — S81802A Unspecified open wound, left lower leg, initial encounter: Secondary | ICD-10-CM | POA: Diagnosis not present

## 2020-12-17 DIAGNOSIS — Z5189 Encounter for other specified aftercare: Secondary | ICD-10-CM

## 2020-12-17 DIAGNOSIS — Z4801 Encounter for change or removal of surgical wound dressing: Secondary | ICD-10-CM | POA: Diagnosis not present

## 2020-12-17 DIAGNOSIS — Z79899 Other long term (current) drug therapy: Secondary | ICD-10-CM | POA: Diagnosis not present

## 2020-12-17 DIAGNOSIS — I10 Essential (primary) hypertension: Secondary | ICD-10-CM | POA: Diagnosis not present

## 2020-12-17 DIAGNOSIS — Z4800 Encounter for change or removal of nonsurgical wound dressing: Secondary | ICD-10-CM | POA: Diagnosis not present

## 2020-12-17 LAB — CBC WITH DIFFERENTIAL/PLATELET
Abs Immature Granulocytes: 0.02 10*3/uL (ref 0.00–0.07)
Basophils Absolute: 0 10*3/uL (ref 0.0–0.1)
Basophils Relative: 1 %
Eosinophils Absolute: 0.1 10*3/uL (ref 0.0–0.5)
Eosinophils Relative: 1 %
HCT: 38.8 % — ABNORMAL LOW (ref 39.0–52.0)
Hemoglobin: 12.5 g/dL — ABNORMAL LOW (ref 13.0–17.0)
Immature Granulocytes: 0 %
Lymphocytes Relative: 22 %
Lymphs Abs: 1.8 10*3/uL (ref 0.7–4.0)
MCH: 27.8 pg (ref 26.0–34.0)
MCHC: 32.2 g/dL (ref 30.0–36.0)
MCV: 86.4 fL (ref 80.0–100.0)
Monocytes Absolute: 0.9 10*3/uL (ref 0.1–1.0)
Monocytes Relative: 11 %
Neutro Abs: 5.5 10*3/uL (ref 1.7–7.7)
Neutrophils Relative %: 65 %
Platelets: 212 10*3/uL (ref 150–400)
RBC: 4.49 MIL/uL (ref 4.22–5.81)
RDW: 13.1 % (ref 11.5–15.5)
WBC: 8.4 10*3/uL (ref 4.0–10.5)
nRBC: 0 % (ref 0.0–0.2)

## 2020-12-17 LAB — BASIC METABOLIC PANEL
Anion gap: 5 (ref 5–15)
BUN: 18 mg/dL (ref 6–20)
CO2: 27 mmol/L (ref 22–32)
Calcium: 8.7 mg/dL — ABNORMAL LOW (ref 8.9–10.3)
Chloride: 107 mmol/L (ref 98–111)
Creatinine, Ser: 0.81 mg/dL (ref 0.61–1.24)
GFR, Estimated: >60 mL/min (ref >60)
Glucose, Bld: 96 mg/dL (ref 70–99)
Potassium: 5.1 mmol/L (ref 3.5–5.1)
Sodium: 139 mmol/L (ref 135–145)

## 2020-12-17 MED ORDER — AMOXICILLIN-POT CLAVULANATE 875-125 MG PO TABS
1.0000 | ORAL_TABLET | Freq: Once | ORAL | Status: AC
  Administered 2020-12-17: 1 via ORAL
  Filled 2020-12-17: qty 1

## 2020-12-17 MED ORDER — AMOXICILLIN-POT CLAVULANATE 875-125 MG PO TABS: 1.0000 | ORAL_TABLET | Freq: Two times a day (BID) | ORAL | 0 refills | Status: DC

## 2020-12-17 NOTE — ED Triage Notes (Signed)
Pt has a chronic wound that has been healing well for almost a year on the LLE, states he sees someone in [place], states he thinks the scab came off during his sleep and woke up to his dog licking his wound,  Wound is healing well with no sign/sx of infection.

## 2020-12-17 NOTE — ED Notes (Signed)
See triage note  Presents for wound check to R[facility]   Has had wound for several months   Denies any new or recent injury

## 2020-12-17 NOTE — Discharge Instructions (Addendum)
Keep the wound clean, dry, and covered. Take the antibiotic as directed. Follow-up with your provider for wound check. Return for fevers, chills, or pus drainage.

## 2020-12-17 NOTE — ED Triage Notes (Signed)
First Nurse Note:  Patient arrives stating he has wounds to legs which he has been trying to heal for a long time, and woke up this morning to his dog either licking or biting at his wounds.
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
## 2020-12-21 ENCOUNTER — Telehealth: Payer: Self-pay

## 2020-12-21 ENCOUNTER — Telehealth: Payer: Self-pay | Admitting: Internal Medicine

## 2020-12-21 NOTE — Telephone Encounter (Signed)
Patient informed and verbalized understanding.  He will call Dr Marla Roe for an appointment and be placed on the cancellation list to be seen as soon as possible.

## 2020-12-21 NOTE — Telephone Encounter (Signed)
I am not caring for wound plastic surgery is he needs appt with them

## 2020-12-21 NOTE — Telephone Encounter (Signed)
Patient called to say that he has another wound at the top of the muscle they transplanted from his stomach to his leg in 2005.  He said that it's getting red.  He has put collagen on it and a 4x4 bandage, then he wrapped it in gauze and an ace bandage.  He said that he did the same thing he did for the wound on his lower leg for this wound.  Patient said this wound is larger than the other one and he needs a prescription for more collagen in the 4x4 size.  Please call.

## 2020-12-21 NOTE — Telephone Encounter (Signed)
Patient called for a follow up from the ED. Patient states that his leg is swollen and bleeding. No appointments available today. Patient stated he need to see someone today.

## 2020-12-22 ENCOUNTER — Ambulatory Visit (INDEPENDENT_AMBULATORY_CARE_PROVIDER_SITE_OTHER): Payer: Medicare Other | Admitting: Surgical

## 2020-12-22 ENCOUNTER — Other Ambulatory Visit: Payer: Self-pay

## 2020-12-22 ENCOUNTER — Encounter: Payer: Self-pay | Admitting: Surgical

## 2020-12-22 ENCOUNTER — Telehealth: Payer: Self-pay

## 2020-12-22 VITALS — BP 112/76 | HR 93

## 2020-12-22 DIAGNOSIS — S81802D Unspecified open wound, left lower leg, subsequent encounter: Secondary | ICD-10-CM | POA: Diagnosis not present

## 2020-12-22 NOTE — Progress Notes (Signed)
Referring Provider McLean-Scocuzza, [name], MD [place],  [place] [address]   CC:  Chief Complaint  Patient presents with  . Follow-up      William Chase is an 55 y.o. male.  HPI: Patient is a 55 year old male here for evaluation of his left leg.  Patient was last seen in the office on 12/01/2020 by another provider.  We initially saw the patient for evaluation of his distal left lower extremity wound and then he subsequently developed a more proximal left lower extremity wound for unknown reasons prior to his second appointment.  The initial distal left lower extremity wound has completely epithelialized at this time.  Patient recently presented to the emergency room for evaluation of his leg on 12/17/2020.  He reported to the ED at that time that he presented there for evaluation after noticing his dog was licking and eating the scab of his left lower extremity wound.  Also concern for infection.  At that time he was started on Augmentin and encouraged to follow-up with PCP or plastic surgery.  Patient reports that he is here today because he is concerned about this worsening wound on his proximal leg and is very upset.  He reports that he has been applying collagen to his leg and wrapping it with 4 x 4 gauze and Kerlix and an Ace wrap.  He reports that he is not having any infectious symptoms.  He reports that he is continuing to take the antibiotic prescribed by the emergency room.  Patient reports that he feels as if he developed this wound after bumping his leg on something at home.  He reports that he has no sensation in his leg and this is happened to him frequently.  He reports that he is very worried about this occurring frequently.  Review of Systems General: No fevers, chills, nausea, vomiting  Physical Exam Vitals with BMI 12/22/2020 12/17/2020 12/01/2020  Height - 5\' 8"  -  Weight - 204 lbs -  BMI - 70.01 -  Systolic 749 449 675  Diastolic 76 74 65  Pulse 93  81 98    General:  No acute distress,  Alert and oriented, Non-Toxic, Normal speech and affect Left lower extremity: Distal left lower extremity wound is well-healed.  No surrounding erythema.  No wounds noted.  Proximal left lower extremity wound is 5 x 6 cm with some areas of thickened eschar noted.  There is some surrounding erythema, it is not cellulitic.  There is no foul odor or purulent drainage noted.  There is some bleeding noted.  No tenderness to palpation.  No crepitus noted with palpation.  Assessment/Plan  Patient with 2 left lower extremity wound.  The distal left lower extremity wound for which we were initially treating has completely epithelialized and is well-healed.  Patient has a worsened proximal left lower extremity wound.  He is currently on antibiotics for this which was prescribed by the ED on 12/17/2020. I recommend he complete the antibiotic prescribed by the ED.  I discussed with the patient that I would recommend he continue with Xeroform or collagen dressing changes daily.  I discussed with the patient that we will order him a new prescription of collagen from [facility].  I discussed with him until he receives the additional collagen to use Xeroform daily.  He can get this area wet when showering, but should avoid scrubbing the area.  I recommend he keep the area covered with 4 x 4 gauze, Kerlix,  Ace wrap over the collagen or Xeroform 24/7 unless he is showering or changing the dressing to prevent additional wounds.  Using scissors and pickups I sharply debrided the patient's left lower extremity wound of devitalized tissue and thickened eschar.  Patient tolerated this fine.  Sterile technique was used.  Silver nitrate sticks were used to cauterize scant bleeding.  Compression was then held over the wound for 5 minutes for scant bleeding.  I did discuss with the patient that due to his wound needing additional attention more frequently, I recommend a referral to the wound  care center as they are able to provide more frequent care than we are.  Patient is understanding and in agreement with this.  He reports that he feels as if this is something that he would be more comfortable being evaluated more frequently.  I recommend the patient keep his follow-up with me scheduled for 01/04/2021.  I recommend he call us with any questions or concerns or if his symptoms worsen or change.  I recommend he call us if he develops any infectious symptoms.   Carola Rhine Scheeler 12/22/2020, 12:06 PM

## 2020-12-22 NOTE — Telephone Encounter (Signed)
Patient called to let us know that his leg is red from his knee to his ankle.  Patient said that he has had a muscle graft and it looks like it's shriveling up and dying.  Patient said his leg is blue on the right side of his shin on his left leg.  Patient said he is scared he is going to lose his leg. Please call.

## 2020-12-22 NOTE — Telephone Encounter (Addendum)
Patient seen in the office today by Matthew,PA-C.//AB/CMA

## 2020-12-23 NOTE — Telephone Encounter (Signed)
Thank you for the follow up and referral to wound care  Yes history of TBI and cognitive issues    Take care I hope you are well as well

## 2021-01-04 ENCOUNTER — Telehealth: Payer: Self-pay

## 2021-01-04 ENCOUNTER — Encounter: Payer: Self-pay | Admitting: Surgical

## 2021-01-04 ENCOUNTER — Ambulatory Visit (INDEPENDENT_AMBULATORY_CARE_PROVIDER_SITE_OTHER): Payer: Medicare Other | Admitting: Surgical

## 2021-01-04 ENCOUNTER — Other Ambulatory Visit: Payer: Self-pay

## 2021-01-04 DIAGNOSIS — S81802D Unspecified open wound, left lower leg, subsequent encounter: Secondary | ICD-10-CM | POA: Diagnosis not present

## 2021-01-04 DIAGNOSIS — R413 Other amnesia: Secondary | ICD-10-CM

## 2021-01-04 MED ORDER — DOXYCYCLINE HYCLATE 100 MG PO TABS: 100.0000 mg | ORAL_TABLET | Freq: Two times a day (BID) | ORAL | 0 refills | Status: AC

## 2021-01-04 NOTE — Telephone Encounter (Signed)
Faxed order to [facility]: Collagen dressing- to be changed every other day. Kerlix, 4x4 guaze, ace wrap and saline change daily

## 2021-01-04 NOTE — Progress Notes (Signed)
   Referring Provider McLean-Scocuzza, [name], MD [place],  [place] [address]   CC:  Chief Complaint  Patient presents with  . Follow-up      William Chase is an 55 y.o. male.  HPI: Patient is a 55 year old male here for follow-up on his left leg wound.  He was initially sent to our office for evaluation of a distal left lower extremity wound which is now healed.  He has developed an additional wound on the proximal shin.  He was last seen in the office on 12/22/2020.   He has reported multiple times that he has no sensation to this area, therefore he injures the area frequently by bumping it on objects at his home.  He reports he has been wearing a shin guard over the left lower extremity.  He reports his ex-wife recommended he try this.  He reports that he has an appointment with the wound care clinic.  He denies any fevers, chills, nausea, vomiting.  He reports he has been using the collagen dressing changes at home, reports he has not received any supplies from [facility].  Review of Systems General: No fevers, chills, nausea, vomiting  Physical Exam Vitals with BMI 12/22/2020 12/17/2020 12/01/2020  Height - 5\' 8"  -  Weight - 204 lbs -  BMI - 92.11 -  Systolic 941 740 814  Diastolic 76 74 65  Pulse 93 81 98    General:  No acute distress,  Alert and oriented, Non-Toxic, Normal speech and affect Left lower extremity: Improvement in left lower extremity erythema/irritation surrounding the wound bed.  Good base of granulation tissue noted.  Residual collagen in place.  Wound is 6.5 x 5 x 0.2 cm.  Distal left lower extremity wound is healed with some dry flaking skin noted.   Assessment/Plan  Patient has a scheduled visit with wound care clinic on 01/27/2021, appreciate their assistance.  We will continue to assist the patient as needed until his appointment with their office.  I recommend he follow-up with me in 2 weeks for an additional evaluation prior to his appointment  with the wound care clinic so there is no gap in his care.  At this time I recommend he continue with collagen dressing changes every other day.  We will order him some supplies from [facility].  Antibiotic called into pharmacy for 5 days for coverage.  I do not see any overt sign of infection, however he is still having some erythema surrounding the wound.  Pictures were obtained of the patient and placed in the chart with the patient's or guardian's permission.   Carola Rhine Scheeler 01/04/2021, 11:38 AM

## 2021-01-05 DIAGNOSIS — S81802A Unspecified open wound, left lower leg, initial encounter: Secondary | ICD-10-CM | POA: Diagnosis not present

## 2021-01-06 ENCOUNTER — Telehealth: Payer: Self-pay | Admitting: Surgical

## 2021-01-06 NOTE — Telephone Encounter (Signed)
Patient called to say he hasn't received his supplies yet from [facility]. Advd that [facility] faxed the order to them on Monday afternoon after clinic so the supplies may be in route to him. Patient said okay.

## 2021-01-13 DIAGNOSIS — M79662 Pain in left lower leg: Secondary | ICD-10-CM | POA: Diagnosis not present

## 2021-01-13 DIAGNOSIS — G894 Chronic pain syndrome: Secondary | ICD-10-CM | POA: Diagnosis not present

## 2021-01-13 DIAGNOSIS — M47816 Spondylosis without myelopathy or radiculopathy, lumbar region: Secondary | ICD-10-CM | POA: Diagnosis not present

## 2021-01-13 DIAGNOSIS — Z79891 Long term (current) use of opiate analgesic: Secondary | ICD-10-CM | POA: Diagnosis not present

## 2021-01-13 DIAGNOSIS — M25511 Pain in right shoulder: Secondary | ICD-10-CM | POA: Diagnosis not present

## 2021-01-20 ENCOUNTER — Other Ambulatory Visit: Payer: Self-pay

## 2021-01-20 ENCOUNTER — Ambulatory Visit (INDEPENDENT_AMBULATORY_CARE_PROVIDER_SITE_OTHER): Payer: Medicare Other | Admitting: Surgical

## 2021-01-20 ENCOUNTER — Encounter: Payer: Self-pay | Admitting: Surgical

## 2021-01-20 ENCOUNTER — Ambulatory Visit: Payer: Medicare Other | Admitting: Surgical

## 2021-01-20 VITALS — BP 155/88 | HR 94

## 2021-01-20 DIAGNOSIS — S81802D Unspecified open wound, left lower leg, subsequent encounter: Secondary | ICD-10-CM | POA: Diagnosis not present

## 2021-01-20 DIAGNOSIS — R413 Other amnesia: Secondary | ICD-10-CM

## 2021-01-20 NOTE — Progress Notes (Signed)
   Subjective:     Patient ID: William Chase, male    DOB: [birth year], 55 y.o.   MRN: [id number]  Chief Complaint  Patient presents with  . Follow-up    HPI: The patient is a 55 y.o. male here for follow-up on his left lower extremity wound.  He has been doing collagen dressing changes at home.  He reports he is pleased with how things are going.  He denies any infectious symptoms.  He denies any shortness of breath or chest pains.  He reports he has rearrange things at his home which has made navigating around his home easier to prevent injuring his leg on objects.  Review of Systems  Constitutional: Negative.   Respiratory: Negative.   Cardiovascular: Negative.   Skin: Positive for wound.     Objective:   Vital Signs BP (!) 155/88 (BP Location: Right Arm, Patient Position: Sitting, Cuff Size: Large)   Pulse 94   SpO2 98%  Vital Signs and Nursing Note Reviewed Physical Exam Constitutional:      General: He is not in acute distress.    Appearance: Normal appearance. He is not ill-appearing.  HENT:     Head: Normocephalic and atraumatic.  Musculoskeletal:     Right lower leg: No edema.     Left lower leg: No edema.  Skin:    General: Skin is warm and dry.     Findings: No rash.       Neurological:     General: No focal deficit present.     Mental Status: He is alert.  Psychiatric:        Mood and Affect: Mood normal.        Behavior: Behavior normal.         Assessment/Plan:     ICD-10-CM   1. Wound of left lower extremity, subsequent encounter  S81.802D   2. Memory loss  R66.38     55 year old male with a left lower extremity wound.We have been treating this with collagen and this is overall been going well.  He is pleased with his progress so far.  He was scheduled for evaluation at the wound care center but He reports that he would like to continue seeing our office for wound care.  I did discuss with him that my availability is limited and I am happy  to continue seeing him but it would not be as frequent as I think would be necessary.  I discussed with him my next availability is in 3 to 4 weeks.  He does have an appointment with the wound care center in 1 week.  I think it would be good for him to be evaluated at their office as they may have other thoughts that may increase his chance of healing this faster.  I did chemically cauterize the left lower extremity wound using silver nitrate as he had some bleeding of the wound.  Patient is understanding of this, he is going to see the wound care center and then will follow up with [place] in a few weeks to further discuss.  Appreciate their assistance.  I recommend he call us with questions or concerns.  Picture was taken and placed in this patient's chart with his consent.  There is no sign of infection.   Carola Rhine Adolph Clutter, PA-C 01/20/2021, 10:06 AM

## 2021-01-21 ENCOUNTER — Other Ambulatory Visit: Payer: Self-pay | Admitting: Internal Medicine

## 2021-01-21 DIAGNOSIS — E785 Hyperlipidemia, unspecified: Secondary | ICD-10-CM

## 2021-01-27 ENCOUNTER — Encounter (HOSPITAL_BASED_OUTPATIENT_CLINIC_OR_DEPARTMENT_OTHER): Payer: Medicare Other | Admitting: Physician Assistant

## 2021-02-15 ENCOUNTER — Ambulatory Visit: Payer: Medicare Other | Admitting: Dermatology

## 2021-02-16 ENCOUNTER — Encounter: Payer: Self-pay | Admitting: Surgical

## 2021-02-16 ENCOUNTER — Ambulatory Visit (INDEPENDENT_AMBULATORY_CARE_PROVIDER_SITE_OTHER): Payer: Medicare Other | Admitting: Surgical

## 2021-02-16 VITALS — BP 124/80 | HR 74

## 2021-02-16 DIAGNOSIS — S81802D Unspecified open wound, left lower leg, subsequent encounter: Secondary | ICD-10-CM | POA: Diagnosis not present

## 2021-02-16 NOTE — Progress Notes (Signed)
   Referring Provider McLean-Scocuzza, [name], MD [place],   [id number]   CC:  Chief Complaint  Patient presents with  . Follow-up      William Chase is an 55 y.o. male.  HPI: Patient is a 55 year old male here for follow-up on his left lower extremity wound.  Of note he has had a free muscle flap to the left lower extremity after an injury many years ago.  He has no sensation to his lower extremity.  He has been doing collagen dressing changes.  Patient reports he is overall doing well.  He feels as if things are improving.  He has no complaints.  No infectious symptoms.  Review of Systems General: No fevers or chills  Physical Exam Vitals with BMI 02/16/2021 01/20/2021 12/22/2020  Height - - -  Weight - - -  BMI - - -  Systolic 774 128 786  Diastolic 80 88 76  Pulse 74 94 93    General:  No acute distress,  Alert and oriented, Non-Toxic, Normal speech and affect Left lower extremity: Left lower extremity wound is 3.5 x 1 x 0.1 cm with surrounding new epithelialization and scabbing noted.  There is no purulent drainage.  There is no foul odor.  No cellulitic changes noted.  Distal left lower extremity wound is completely healed and is doing well.  Assessment/Plan  Recommend continuing with collagen dressing changes every other day to left lower extremity wound.  Cover this with gauze, wrapped with Kerlix and Ace wrap.  There is no sign of infection.  Picture was taken and placed in the patient's chart with patient's permission.  Recommend following up in 3 to 4 weeks for reevaluation.  Carola Rhine Mayson Mcneish 02/16/2021, 11:10 AM

## 2021-03-06 ENCOUNTER — Other Ambulatory Visit: Payer: Self-pay | Admitting: Internal Medicine

## 2021-03-06 DIAGNOSIS — E538 Deficiency of other specified B group vitamins: Secondary | ICD-10-CM

## 2021-03-16 ENCOUNTER — Ambulatory Visit (INDEPENDENT_AMBULATORY_CARE_PROVIDER_SITE_OTHER): Payer: Medicare Other | Admitting: Surgical

## 2021-03-16 ENCOUNTER — Other Ambulatory Visit: Payer: Self-pay

## 2021-03-16 DIAGNOSIS — S81802D Unspecified open wound, left lower leg, subsequent encounter: Secondary | ICD-10-CM | POA: Diagnosis not present

## 2021-03-16 DIAGNOSIS — R413 Other amnesia: Secondary | ICD-10-CM | POA: Diagnosis not present

## 2021-03-16 MED ORDER — DOXYCYCLINE HYCLATE 100 MG PO TABS: 100.0000 mg | ORAL_TABLET | Freq: Two times a day (BID) | ORAL | 0 refills | Status: AC

## 2021-03-16 NOTE — Progress Notes (Signed)
   Referring Provider Chase, William Glow, MD [place],  [place] [id number]   CC:  Chief Complaint  Patient presents with  . Follow-up      William Chase is an 55 y.o. male.  HPI: Patient is a 55 year old male here for follow-up on his left lower extremity wound.  He has history of free muscle flap to the left lower extremity after an injury many years ago.  He has no sensation to his lower extremity.  He has been doing collagen dressing changes.  He reports that he has new wounds on his lower extremity, he is not completely sure how he sustained these.  He does report that he recently went camping for 5 days and is unsure if it is associated with this.  He is also unsure if his dog caused it by jumping on him or if he may have scratched his lower extremity himself.  He is not having any infectious symptoms.    Review of Systems General: No fevers, chills Skin: Positive erythema  Physical Exam Vitals with BMI 02/16/2021 01/20/2021 12/22/2020  Height - - -  Weight - - -  BMI - - -  Systolic 226 333 545  Diastolic 80 88 76  Pulse 74 94 93    General:  No acute distress,  Alert and oriented, Non-Toxic, Normal speech and affect Left lower extremity: Left lower extremity wound is 6.5 x 3 x 0.2 cm.  The wound was previously 3.5 by 1 cm but he subsequently sustained an additional injury which caused a new wound.  There is some surrounding erythema.  There is no purulent drainage.  There is no foul odor.  There is no cellulitic changes.  Distal left lower extremity wound is completely healed.  Assessment/Plan Patient is a 55 year old male with left lower extremity wound.  He has no sensation to his left lower extremity which has resulted in multiple additional wounds to the left lower extremity over the past 6 months.  I recommend he continue with collagen dressing changes every other day to the left lower extremity wound.  I recommend avoiding any strenuous activities  and being more careful to prevent additional injuries until this left lower extremity wound has healed.  Picture was taken and placed in the patient's chart with patient permission.  William Chase 03/16/2021, 11:22 AM

## 2021-03-17 DIAGNOSIS — M79662 Pain in left lower leg: Secondary | ICD-10-CM | POA: Diagnosis not present

## 2021-03-17 DIAGNOSIS — M25511 Pain in right shoulder: Secondary | ICD-10-CM | POA: Diagnosis not present

## 2021-03-17 DIAGNOSIS — G894 Chronic pain syndrome: Secondary | ICD-10-CM | POA: Diagnosis not present

## 2021-03-17 DIAGNOSIS — M47816 Spondylosis without myelopathy or radiculopathy, lumbar region: Secondary | ICD-10-CM | POA: Diagnosis not present

## 2021-03-23 DIAGNOSIS — S81802A Unspecified open wound, left lower leg, initial encounter: Secondary | ICD-10-CM | POA: Diagnosis not present

## 2021-04-05 ENCOUNTER — Telehealth: Payer: Self-pay

## 2021-04-05 NOTE — Telephone Encounter (Signed)
Left message to return call.  Please schedule virtual visit when Patient calling back in

## 2021-04-05 NOTE — Telephone Encounter (Signed)
Pt called in stating that they have felt weak x1week. C/o cold sweats, nausea, and loss of appetite. Pt called in at 5:13pm and was instructed to see their PCP within 4 hours.

## 2021-04-06 ENCOUNTER — Telehealth: Payer: Self-pay

## 2021-04-06 ENCOUNTER — Telehealth (INDEPENDENT_AMBULATORY_CARE_PROVIDER_SITE_OTHER): Payer: Medicare Other | Admitting: Adult Health

## 2021-04-06 ENCOUNTER — Ambulatory Visit: Payer: Medicare Other | Admitting: Surgical

## 2021-04-06 ENCOUNTER — Other Ambulatory Visit: Payer: Self-pay

## 2021-04-06 ENCOUNTER — Emergency Department
Admission: EM | Admit: 2021-04-06 | Discharge: 2021-04-06 | Disposition: A | Discharge status: Home / Self Care | Payer: Medicare Other | Attending: Emergency Medicine | Admitting: Emergency Medicine

## 2021-04-06 ENCOUNTER — Other Ambulatory Visit
Admission: RE | Admit: 2021-04-06 | Discharge: 2021-04-06 | Disposition: A | Discharge status: Home / Self Care | Payer: Medicare Other | Source: Home / Self Care | Attending: Adult Health | Admitting: Adult Health

## 2021-04-06 DIAGNOSIS — R399 Unspecified symptoms and signs involving the genitourinary system: Secondary | ICD-10-CM | POA: Insufficient documentation

## 2021-04-06 DIAGNOSIS — R8290 Unspecified abnormal findings in urine: Secondary | ICD-10-CM | POA: Diagnosis not present

## 2021-04-06 DIAGNOSIS — R748 Abnormal levels of other serum enzymes: Secondary | ICD-10-CM

## 2021-04-06 DIAGNOSIS — T148XXA Other injury of unspecified body region, initial encounter: Secondary | ICD-10-CM | POA: Diagnosis not present

## 2021-04-06 DIAGNOSIS — R7989 Other specified abnormal findings of blood chemistry: Secondary | ICD-10-CM

## 2021-04-06 DIAGNOSIS — L089 Local infection of the skin and subcutaneous tissue, unspecified: Secondary | ICD-10-CM

## 2021-04-06 DIAGNOSIS — I10 Essential (primary) hypertension: Secondary | ICD-10-CM | POA: Insufficient documentation

## 2021-04-06 DIAGNOSIS — R0602 Shortness of breath: Secondary | ICD-10-CM | POA: Diagnosis not present

## 2021-04-06 DIAGNOSIS — Z20822 Contact with and (suspected) exposure to covid-19: Secondary | ICD-10-CM | POA: Diagnosis not present

## 2021-04-06 DIAGNOSIS — Z79899 Other long term (current) drug therapy: Secondary | ICD-10-CM | POA: Insufficient documentation

## 2021-04-06 DIAGNOSIS — R309 Painful micturition, unspecified: Secondary | ICD-10-CM | POA: Diagnosis present

## 2021-04-06 DIAGNOSIS — N3000 Acute cystitis without hematuria: Secondary | ICD-10-CM | POA: Diagnosis not present

## 2021-04-06 LAB — CBC WITH DIFFERENTIAL/PLATELET
Abs Immature Granulocytes: 0.05 10*3/uL (ref 0.00–0.07)
Basophils Absolute: 0.1 10*3/uL (ref 0.0–0.1)
Basophils Relative: 1 %
Eosinophils Absolute: 0.1 10*3/uL (ref 0.0–0.5)
Eosinophils Relative: 1 %
HCT: 44.3 % (ref 39.0–52.0)
Hemoglobin: 14.8 g/dL (ref 13.0–17.0)
Immature Granulocytes: 1 %
Lymphocytes Relative: 29 %
Lymphs Abs: 3 10*3/uL (ref 0.7–4.0)
MCH: 28.2 pg (ref 26.0–34.0)
MCHC: 33.4 g/dL (ref 30.0–36.0)
MCV: 84.4 fL (ref 80.0–100.0)
Monocytes Absolute: 1.3 10*3/uL — ABNORMAL HIGH (ref 0.1–1.0)
Monocytes Relative: 12 %
Neutro Abs: 5.9 10*3/uL (ref 1.7–7.7)
Neutrophils Relative %: 56 %
Platelets: 421 10*3/uL — ABNORMAL HIGH (ref 150–400)
RBC: 5.25 MIL/uL (ref 4.22–5.81)
RDW: 12.6 % (ref 11.5–15.5)
WBC: 10.4 10*3/uL (ref 4.0–10.5)
nRBC: 0 % (ref 0.0–0.2)

## 2021-04-06 LAB — URINALYSIS, COMPLETE (UACMP) WITH MICROSCOPIC
Bilirubin Urine: NEGATIVE
Glucose, UA: NEGATIVE mg/dL
Hgb urine dipstick: NEGATIVE
Ketones, ur: NEGATIVE mg/dL
Nitrite: POSITIVE — AB
Protein, ur: NEGATIVE mg/dL
Specific Gravity, Urine: 1.012 (ref 1.005–1.030)
WBC, UA: >50 WBC/hpf — ABNORMAL HIGH (ref 0–5)
pH: 6 (ref 5.0–8.0)

## 2021-04-06 LAB — COMPREHENSIVE METABOLIC PANEL
ALT: 36 U/L (ref 0–44)
AST: 33 U/L (ref 15–41)
Albumin: 4 g/dL (ref 3.5–5.0)
Alkaline Phosphatase: 67 U/L (ref 38–126)
Anion gap: 11 (ref 5–15)
BUN: 27 mg/dL — ABNORMAL HIGH (ref 6–20)
CO2: 29 mmol/L (ref 22–32)
Calcium: 9.8 mg/dL (ref 8.9–10.3)
Chloride: 97 mmol/L — ABNORMAL LOW (ref 98–111)
Creatinine, Ser: 1.48 mg/dL — ABNORMAL HIGH (ref 0.61–1.24)
GFR, Estimated: 57 mL/min — ABNORMAL LOW (ref >60)
Glucose, Bld: 106 mg/dL — ABNORMAL HIGH (ref 70–99)
Potassium: 4.4 mmol/L (ref 3.5–5.1)
Sodium: 137 mmol/L (ref 135–145)
Total Bilirubin: 0.6 mg/dL (ref 0.3–1.2)
Total Protein: 8.6 g/dL — ABNORMAL HIGH (ref 6.5–8.1)

## 2021-04-06 LAB — RESP PANEL BY RT-PCR (FLU A&B, COVID) ARPGX2
Influenza A by PCR: NEGATIVE
Influenza B by PCR: NEGATIVE
SARS Coronavirus 2 by RT PCR: NEGATIVE

## 2021-04-06 LAB — BASIC METABOLIC PANEL
Anion gap: 5 (ref 5–15)
BUN: 27 mg/dL — ABNORMAL HIGH (ref 6–20)
CO2: 27 mmol/L (ref 22–32)
Calcium: 8.4 mg/dL — ABNORMAL LOW (ref 8.9–10.3)
Chloride: 106 mmol/L (ref 98–111)
Creatinine, Ser: 1.17 mg/dL (ref 0.61–1.24)
GFR, Estimated: >60 mL/min (ref >60)
Glucose, Bld: 117 mg/dL — ABNORMAL HIGH (ref 70–99)
Potassium: 4.7 mmol/L (ref 3.5–5.1)
Sodium: 138 mmol/L (ref 135–145)

## 2021-04-06 MED ORDER — CIPROFLOXACIN HCL 500 MG PO TABS
500.0000 mg | ORAL_TABLET | Freq: Once | ORAL | Status: AC
  Administered 2021-04-06: 500 mg via ORAL
  Filled 2021-04-06: qty 1

## 2021-04-06 MED ORDER — SODIUM CHLORIDE 0.9 % IV BOLUS
1000.0000 mL | Freq: Once | INTRAVENOUS | Status: AC
  Administered 2021-04-06: 1000 mL via INTRAVENOUS

## 2021-04-06 MED ORDER — SODIUM CHLORIDE 0.9 % IV SOLN
1.0000 g | Freq: Once | INTRAVENOUS | Status: AC
  Administered 2021-04-06: 1 g via INTRAVENOUS
  Filled 2021-04-06: qty 10

## 2021-04-06 MED ORDER — CIPROFLOXACIN HCL 500 MG PO TABS: 500.0000 mg | ORAL_TABLET | Freq: Two times a day (BID) | ORAL | 0 refills | Status: AC

## 2021-04-06 NOTE — Telephone Encounter (Signed)
Patient called office for direction to lab, advised patient he need to go to [place] at [facility]. He stated he was lost at [facility] clinic and that he felt really bad and did not feel like being lost. I advised patient he made need to go to ER versus waiting for 2:30 Virtual with provider. Patient refused ER so I stayed on phone with patient until he reached the [facility].

## 2021-04-06 NOTE — Telephone Encounter (Signed)
Patient called back to office at 4:30 PM asking why he needed to go the ER I advised patient according to his labs and NP earlier note he is having acute issue with his kidney that requires fiurther investigation at the ER in her opinion.

## 2021-04-06 NOTE — Telephone Encounter (Signed)
Called pt. Pt states he's had dizziness, no appetite, headache, back pain, smell to his urine, burning when peeing and frequent urination. Pt states urine is a dark yellow color. Pt was advised to go to ER or UC to be evaluated. Pt wants to do labs at the hospital and still be seen by provider. Pt will call office back if any problems.

## 2021-04-06 NOTE — ED Triage Notes (Signed)
Pt comes with c/o UTI. Pt states he has been having burning and pain  with urination and foul odor.  Pt denies any blood in urine. Pt states he was at his PCP today and was informed to come here for further evaluation.

## 2021-04-06 NOTE — Discharge Instructions (Addendum)
Please make sure you are drinking lots of fluids.  Take antibiotics as prescribed.  Follow-up with PCP if no improvement in symptoms in the next 3 to 4 days.  Return to the ER for any back pain, abdominal pain, fevers, worsening symptoms or urgent changes in your health.

## 2021-04-06 NOTE — Telephone Encounter (Signed)
Patient called for appointment today virtually by CMA whom tried to advise patient that he needed to be seen in the ER patient refused CMA ask if I could speak with patient. Advised patient NP advised ED due to results of recent CMET and CBC that he needed further evaluation for acute Kidney issue.

## 2021-04-06 NOTE — Progress Notes (Signed)
Virtual Visit via Telephone Note  I connected with William Chase on 04/06/21 at  2:30 PM EDT by telephone and verified that I am speaking with the correct person using two identifiers. Parties involved in visit as below:    Location: Patient: at home  Provider: Provider: Provider's office at  [facility], [place].      I discussed the limitations, risks, security and privacy concerns of performing an evaluation and management service by telephone and the availability of in person appointments. I also discussed with the patient that there may be a patient responsible charge related to this service. The patient expressed understanding and agreed to proceed.   History of Present Illness:  Patient  complains of rotten urine smell, an he reports wound is worse. He reports he is so weak he can not get out of the bed, he was advised to go to the emergency room earlier by staff here. He refused, labs were ordered at the medical mall, he went and he has a increased creatinine and this is new for him. He denies any dehydration, vomiting, or diarrhea, he reports he has been drinking plenty of water. Has some mild shortness of breath.  B/P 105/64 Increased weakness.  See previous nursin notes as well.   Not seen in office since 07/2020 Patient  denies any fever, body aches,chills, rash, chest pain, shortness of breath, nausea, vomiting, or diarrhea.     Observations/Objective:  There were no vitals taken for this visit. Does not know if he has a fever. He did report b/p as above.  Patient is alert and oriented x 3  and responsive to questions. Engages in conversation with provider. Speaks short blunt words without any pauses without any heard shortness of breath or distress.    Results for orders placed or performed during the hospital encounter of 04/06/21 (from the past 24 hour(s))  Comprehensive metabolic panel     Status: Abnormal   Collection Time: 04/06/21   1:34 PM  Result Value Ref Range   Sodium 137 135 - 145 mmol/L   Potassium 4.4 3.5 - 5.1 mmol/L   Chloride 97 (L) 98 - 111 mmol/L   CO2 29 22 - 32 mmol/L   Glucose, Bld 106 (H) 70 - 99 mg/dL   BUN 27 (H) 6 - 20 mg/dL   Creatinine, Ser 1.48 (H) 0.61 - 1.24 mg/dL   Calcium 9.8 8.9 - 10.3 mg/dL   Total Protein 8.6 (H) 6.5 - 8.1 g/dL   Albumin 4.0 3.5 - 5.0 g/dL   AST 33 15 - 41 U/L   ALT 36 0 - 44 U/L   Alkaline Phosphatase 67 38 - 126 U/L   Total Bilirubin 0.6 0.3 - 1.2 mg/dL   GFR, Estimated 57 (L) >60 mL/min   Anion gap 11 5 - 15  CBC with Differential/Platelet     Status: Abnormal   Collection Time: 04/06/21  1:34 PM  Result Value Ref Range   WBC 10.4 4.0 - 10.5 K/uL   RBC 5.25 4.22 - 5.81 MIL/uL   Hemoglobin 14.8 13.0 - 17.0 g/dL   HCT 44.3 39.0 - 52.0 %   MCV 84.4 80.0 - 100.0 fL   MCH 28.2 26.0 - 34.0 pg   MCHC 33.4 30.0 - 36.0 g/dL   RDW 12.6 11.5 - 15.5 %   Platelets 421 (H) 150 - 400 K/uL   nRBC 0.0 0.0 - 0.2 %   Neutrophils Relative % 56 %  Neutro Abs 5.9 1.7 - 7.7 K/uL   Lymphocytes Relative 29 %   Lymphs Abs 3.0 0.7 - 4.0 K/uL   Monocytes Relative 12 %   Monocytes Absolute 1.3 (H) 0.1 - 1.0 K/uL   Eosinophils Relative 1 %   Eosinophils Absolute 0.1 0.0 - 0.5 K/uL   Basophils Relative 1 %   Basophils Absolute 0.1 0.0 - 0.1 K/uL   Immature Granulocytes 1 %   Abs Immature Granulocytes 0.05 0.00 - 0.07 K/uL     Assessment and Plan:  The primary encounter diagnosis was Wound infection. Diagnoses of Elevated creatine kinase level, Elevated platelet count, Abnormal urine odor, and Shortness of breath were also pertinent to this visit.  Given creatinine has almost doubled, urine smells like rotten urine he reports and provider is unsure of his mental status he is advised to go by 911 to the hospital, emergency room now not to self drive.  Provider can not safely treat patient over the telephone and that severe kidney damage, severe sepsis, or even death could occur  if he does not go to the emergency room now.   Follow Up Instructions:   CCM refferal.  Patient verbalized understanding of all instructions given and denies any further questions at this time after provider reviewed labs and gave instructions.   I discussed the assessment and treatment plan with the patient. The patient was provided an opportunity to ask questions and all were answered. The patient agreed with the plan and demonstrated an understanding of the instructions.   The patient was advised to call back or seek an in-person evaluation if the symptoms worsen or if the condition fails to improve as anticipated.  I provided 15 minutes of non-face-to-face time during this encounter.   Marcille Buffy, FNP

## 2021-04-06 NOTE — Patient Instructions (Signed)
Go to the emergency room now do not self drive, Call [phone] now if needed.  Follow up after ER with PCP.   Acute Kidney Injury, Adult  Acute kidney injury is a sudden worsening of kidney function. The kidneys are organs that have several jobs. They filter the blood to remove waste products and extra fluid. They also maintain a healthy balance of minerals and hormones in the body, which helps control blood pressure and keep bones strong. Withthis condition, your kidneys do not do their jobs as well as they should. This condition ranges from mild to severe. Over time, it may develop into long-lasting (chronic) kidney disease. Early detection and treatment may prevent acute kidney injuryfrom developing into a chronic condition. What are the causes? Common causes of this condition include: A problem with blood flow to the kidneys. This may be caused by: Low blood pressure (hypotension) or shock. Blood loss. Heart and blood vessel (cardiovascular) disease. Severe burns. Liver disease. Direct damage to the kidneys. This may be caused by: Certain medicines. A kidney infection. Poisoning. Being around or in contact with toxic substances. A surgical wound. A hard, direct hit to the kidney area. A sudden blockage of urine flow. This may be caused by: Cancer. Kidney stones. An enlarged prostate in males. What increases the risk? You are more likely to develop this condition if you: Are older than age 65. Are male. Are hospitalized, especially if you are in critical condition. Have certain conditions, such as: Chronic kidney disease. Diabetes. Coronary artery disease and heart failure. Pulmonary disease. Chronic liver disease. What are the signs or symptoms? Symptoms of this condition may not be obvious until the condition becomes severe. Symptoms of this condition can include: Tiredness (lethargy) or difficulty staying awake. Nausea or vomiting. Swelling (edema) of the face, legs,  ankles, or feet. Problems with urination, such as: Pain in the abdomen, or pain along the side of your stomach (flank). Producing little or no urine. Passing urine with a weak flow. Muscle twitches and cramps, especially in the legs. Confusion or trouble concentrating. Loss of appetite. Fever. How is this diagnosed? Your health care provider can diagnose this condition based on your symptoms,medical history, and a physical exam.  You may also have other tests, such as: Blood tests. Urine tests. Imaging tests. A test in which a sample of tissue is removed from the kidneys to be examined under a microscope (kidney biopsy). How is this treated? Treatment for this condition depends on the cause and how severe the condition is. In mild cases, treatment may not be needed. The kidneys may heal on their own. In more severe cases, treatment will involve: Treating the cause of the kidney injury. This may involve changing any medicines you are taking or adjusting your dosage. Fluids. You may need specialized IV fluids to balance your body's needs. Having a catheter placed to drain urine and prevent blockages. Preventing problems from occurring. This may mean avoiding certain medicines or procedures that can cause further injury to the kidneys. In some cases, treatment may also require: A procedure to remove toxic wastes from the body (dialysis or continuous renal replacement therapy, CRRT). Surgery. This may be done to repair a torn kidney or to remove the blockage from the urinary system. Follow these instructions at home: Medicines Take over-the-counter and prescription medicines only as told by your health care provider. Do not take any new medicines without your health care provider's approval. Many medicines can worsen your kidney damage. Do not  take any vitamin and mineral supplements without your health care provider's approval. Many nutritional supplements can worsen your kidney  damage. Lifestyle  If your health care provider prescribed changes to your diet, follow them. You may need to decrease the amount of protein you eat. Achieve and maintain a healthy weight. If you need help with this, ask your health care provider. Start or continue an exercise plan. Try to exercise at least 30 minutes a day, 5 days a week. Do not use any products that contain nicotine or tobacco, such as cigarettes, e-cigarettes, and chewing tobacco. If you need help quitting, ask your health care provider.  General instructions  Keep track of your blood pressure. Report changes in your blood pressure as told by your health care provider. Stay up to date with your vaccines. Ask your health care provider which vaccines you need. Keep all follow-up visits as told by your health care provider. This is important.  Where to find more information American Association of Kidney Patients: BombTimer.gl National Kidney Foundation: www.kidney.[place]: https://mathis.com/ [employer] [facility]: www.lifeoptions.org www.kidneyschool.org Contact a health care provider if: Your symptoms get worse. You develop new symptoms. Get help right away if: You develop symptoms of worsening kidney disease, which include: Headaches. Abnormally dark or light skin. Easy bruising. Frequent hiccups. Chest pain. Shortness of breath. End of menstruation in women. Seizures. Confusion or altered mental status. Abdominal or back pain. Itchiness. You have a fever. Your body is producing less urine. You have pain or bleeding when you urinate. Summary Acute kidney injury is a sudden worsening of kidney function. Acute kidney injury can be caused by problems with blood flow to the kidneys, direct damage to the kidneys, and sudden blockage of urine flow. Symptoms of this condition may not be obvious until it becomes severe. Symptoms may include edema, lethargy, confusion, nausea or  vomiting, and problems passing urine. This condition can be diagnosed with blood tests, urine tests, and imaging tests. Sometimes a kidney biopsy is done to diagnose this condition. Treatment for this condition often involves treating the underlying cause. It is treated with fluids, medicines, diet changes, dialysis, or surgery. This information is not intended to replace advice given to you by your health care provider. Make sure you discuss any questions you have with your healthcare provider. Document Revised: 08/20/2019 Document Reviewed: 08/20/2019 Elsevier Patient Education  2022 [employer].

## 2021-04-06 NOTE — Telephone Encounter (Signed)
Called pt to give provider advice. Pt was advised by provider to go to the ER due to his elevated labs. Pt refused to go to ER against provider advice. Pt stated he didn't understand why provider couldn't just see him in person and prescribe him medication. Pt was informed that he couldn't be seen in person due to his symptoms and [place] protocol and could only be seen virtually but was advised to go to the ER instead by provider. Pt was becoming aggressive and using curse words so the phone was passed off to the Clinical supervisor Kerin Salen.
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
## 2021-04-06 NOTE — Addendum Note (Signed)
Addended by: Nanci Pina on: 04/06/2021 05:22 PM   Modules accepted: Orders

## 2021-04-07 ENCOUNTER — Ambulatory Visit (INDEPENDENT_AMBULATORY_CARE_PROVIDER_SITE_OTHER): Payer: Medicare Other | Admitting: *Deleted

## 2021-04-07 ENCOUNTER — Telehealth: Payer: Self-pay

## 2021-04-07 ENCOUNTER — Telehealth: Payer: Self-pay | Admitting: *Deleted

## 2021-04-07 DIAGNOSIS — F319 Bipolar disorder, unspecified: Secondary | ICD-10-CM

## 2021-04-07 DIAGNOSIS — I10 Essential (primary) hypertension: Secondary | ICD-10-CM

## 2021-04-07 NOTE — Chronic Care Management (AMB) (Signed)
   04/07/2021  William Chase 08-18-1966 [id number]   Phone call to patient to assess for community resource and mental health needs. Patient confirmed being active with a Psychiatrist in [place] and states that although the loss of his mother and wife have been very difficult, grief counseling is not needed at this time. Patient denied having any community resource needs at this time and declined any further CCM social work involvement. Patient has follow up scheduled with the CCM RNCM as well as CCM pharmacist. This social worker's contact number provided in case there are any community resource needs in the future.   [address], LCSW [facility] [phone]

## 2021-04-07 NOTE — Telephone Encounter (Signed)
   Telephone encounter was:  Unsuccessful.  04/07/2021 Name: William Chase MRN: [id number] DOB: 12-25-1965  Unsuccessful outbound call made today to assist with:  Transportation Needs   Outreach Attempt:  1st Attempt  A HIPAA compliant voice message was left requesting a return call.  Instructed patient to call back at [phone].  Analyce Tavares, AAS Paralegal, [place] [facility]  [address], [place] [address] ??[email or web address]  ?? [phone]   [email or web address]

## 2021-04-07 NOTE — Telephone Encounter (Signed)
   Telephone encounter was:  Successful.  04/07/2021 Name: William Chase MRN: [id number] DOB: 02-Aug-1966  William Chase is a 55 y.o. year old male who is a primary care patient of McLean-Scocuzza, [name], MD . The community resource team was consulted for assistance with Transportation Needs   Care guide performed the following interventions: Spoke with patient he stated that he does not need transportation at this time.  Gave patient the number for [employer] if he needs it future appointments. Also reminded patient that the number is on the back of his card..  Follow Up Plan:  No further follow up planned at this time. The patient has been provided with needed resources.  Alayzha An, AAS Paralegal, [place] [facility]  [address], [place] [address] ??[email or web address]  ?? [phone]   [email or web address]

## 2021-04-07 NOTE — Chronic Care Management (AMB) (Signed)
  Care Management  Note   04/07/2021 Name: BUCKY GRIGG MRN: [id number] DOB: 1966-01-05  CYPRIAN GONGAWARE is a 55 y.o. year old male who is a primary care patient of McLean-Scocuzza, [name], MD. The care management team was consulted for assistance with chronic disease management and care coordination needs.   Mr. Dorsey was given information about Care Management services today including:  CCM service includes personalized support from designated clinical staff supervised by the physician, including individualized plan of care and coordination with other care providers 24/7 contact phone numbers for assistance for urgent and routine care needs. Service will only be billed when office clinical staff spend 20 minutes or more in a month to coordinate care. Only one practitioner may furnish and bill the service in a calendar month. The patient may stop CCM services at amy time (effective at the end of the month) by phone call to the office staff. The patient will be responsible for cost sharing (co-pay) or up to 20% of the service fee (after annual deductible is met)  Patient agreed to services and verbal consent obtained.  Follow up plan:   Telephone follow up appointment with care management team member scheduled for:  04/08/2021 with Pharm D 04/16/2021 with RNCM and covering provider for [facility] will outreach to pt   Julian Hy, [place], [place] [facility]  Direct Dial: [phone]

## 2021-04-08 ENCOUNTER — Ambulatory Visit: Payer: Medicare Other | Admitting: Pharmacist

## 2021-04-08 LAB — URINE CULTURE: Culture: >=100000 — AB

## 2021-04-08 NOTE — Chronic Care Management (AMB) (Signed)
  Chronic Care Management   Note  04/08/2021 Name: William Chase MRN: [id number] DOB: 1966/06/02   Attempted to contact patient for scheduled appointment for medication management support. He answered and noted that he was still asleep. Asked if he wanted to rescheduled, he noted that he did not need any help with anything. Reviewed that I had been informed by the clinic nurse that he noted some confusion with his medications upon medication review for his video appointment this week. He noted that he can't read the medication bottles (did not specify if unable to read or the font size). Declined to discuss medications today, declined any medication related needs.   As appointment work up, I had pulled his recent fill history per Dr Brantley Stage as of 04/08/21 (prescriber PCP unless otherwise noted) - Vitamin B12 injection - 90 day supply dispensed 03/09/21 - Cyclobenzaprine 10 mg TID - 30 day supply dispensed 03/27/21 (prescriber Nicholaus Bloom) - Doxycycline 100 mg BID - 5 day supply dispensed 03/16/21 (prescriber Roetta Sessions) - Fluocinolone oil 0.01% - 30 day supply dispensed 03/27/21  - Lisinopril/HCTZ 10/12.5 mg daily - 90 day supply dispensed 01/11/21 - Methylphenidate 10 mg BID - 30 day supply dispensed 03/06/21 (prescriber Norma Fredrickson) - Quetiapine 400 mg BID - 90 day supply dispensed 01/23/21 - Simvastatin 40 mg daily - 90 day supply dispensed 01/21/21 - Bupropion XL 150 mg - 90 day supply dispensed 02/22/21 (prescriber Norma Fredrickson)  - Diazepam 10 mg TID - 30 day supply dispensed 03/06/21 (prescriber Norma Fredrickson) - Oxycodone/APAP 10/325 mg up to QID - 30 day supply dispensed 03/29/21 (prescriber Nicholaus Bloom)   Had planned to at least discuss script for Narcan given concurrent benzodiazepine and opioid therapy. Patient did not give me the opportunity to do so. Will route note to PCP for notification prior to upcoming 04/29/21 appointment.   Will notify RN CM that patient is not interested in  CCM services at this time.   Catie Darnelle Maffucci, PharmD, [place], [place] Clinical Pharmacist [employer] [facility] [place]

## 2021-04-09 ENCOUNTER — Telehealth: Payer: Self-pay

## 2021-04-09 DIAGNOSIS — R8290 Unspecified abnormal findings in urine: Secondary | ICD-10-CM

## 2021-04-09 NOTE — Telephone Encounter (Signed)
CBC, CMP, Urine Microscopic and Culture have been ordered.

## 2021-04-09 NOTE — Progress Notes (Signed)
CMP has had increase in creatinine, he was drinking well at time of labs and no vomiting or diarrhea.GFR shows decreased kidney function. CBC showed mild decrease in urine. He was sent to the ER 04/06/21 due to elevated creatinine, confusion and rotten smelling urine, as well as reports he was  sent home on Cipro.  E coli returned in the urine culture should continue Cipro is sensitive to the bacteria.  Needs recheck CBC and CMP  - urine microscopic and culture in 2 weeks fasting. Please add and schedule.

## 2021-04-12 ENCOUNTER — Other Ambulatory Visit: Payer: Self-pay | Admitting: Internal Medicine

## 2021-04-12 DIAGNOSIS — E538 Deficiency of other specified B group vitamins: Secondary | ICD-10-CM

## 2021-04-12 DIAGNOSIS — E785 Hyperlipidemia, unspecified: Secondary | ICD-10-CM

## 2021-04-12 DIAGNOSIS — E611 Iron deficiency: Secondary | ICD-10-CM

## 2021-04-12 DIAGNOSIS — R7303 Prediabetes: Secondary | ICD-10-CM

## 2021-04-12 DIAGNOSIS — D75839 Thrombocytosis, unspecified: Secondary | ICD-10-CM

## 2021-04-12 DIAGNOSIS — E559 Vitamin D deficiency, unspecified: Secondary | ICD-10-CM

## 2021-04-13 ENCOUNTER — Telehealth: Payer: Self-pay | Admitting: Internal Medicine

## 2021-04-13 NOTE — Telephone Encounter (Signed)
Noted.  If symptoms continue, needs to be evaluated.

## 2021-04-13 NOTE — Telephone Encounter (Signed)
Spoken to patient, he stated he has one more day of abx. His sx started after he stared the abx. He has refused to go to ED/UC. He stated he will wait.

## 2021-04-13 NOTE — Telephone Encounter (Signed)
Noted will await access nurse documentation

## 2021-04-13 NOTE — Telephone Encounter (Signed)
If dizzy and off balance - needs to be seen today.  Recommend evaluation today.

## 2021-04-13 NOTE — Telephone Encounter (Signed)
Transferred to access nurse  Pt has been taking ciprofloxacin (CIPRO) 500 MG tablet since 6/14 and has been feeling dizzy. Today it has gotten worse and he is very unbalanced and falling.  Access Nurse aware no available appts

## 2021-04-14 NOTE — Telephone Encounter (Signed)
Called and spoke with Patient and he states he is feeling better. States he will keep his appointment for 04/29/21 and will call back in should his symptoms return or worsen .

## 2021-04-16 ENCOUNTER — Telehealth: Payer: Medicare Other

## 2021-04-16 ENCOUNTER — Telehealth: Payer: Self-pay | Admitting: Surgical
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
## 2021-04-23 ENCOUNTER — Other Ambulatory Visit: Payer: Medicare Other

## 2021-04-23 ENCOUNTER — Ambulatory Visit (INDEPENDENT_AMBULATORY_CARE_PROVIDER_SITE_OTHER): Payer: Medicare Other | Admitting: Surgical

## 2021-04-23 ENCOUNTER — Other Ambulatory Visit: Payer: Self-pay

## 2021-04-23 DIAGNOSIS — S81802D Unspecified open wound, left lower leg, subsequent encounter: Secondary | ICD-10-CM | POA: Diagnosis not present

## 2021-04-23 DIAGNOSIS — H50332 Intermittent monocular exotropia, left eye: Secondary | ICD-10-CM | POA: Diagnosis not present

## 2021-04-23 DIAGNOSIS — R413 Other amnesia: Secondary | ICD-10-CM | POA: Diagnosis not present
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
## 2021-04-27 ENCOUNTER — Other Ambulatory Visit: Payer: Self-pay

## 2021-04-27 ENCOUNTER — Other Ambulatory Visit: Payer: Medicare Other

## 2021-04-27 ENCOUNTER — Other Ambulatory Visit (INDEPENDENT_AMBULATORY_CARE_PROVIDER_SITE_OTHER): Payer: Medicare Other

## 2021-04-27 DIAGNOSIS — D75839 Thrombocytosis, unspecified: Secondary | ICD-10-CM

## 2021-04-27 DIAGNOSIS — R7303 Prediabetes: Secondary | ICD-10-CM | POA: Diagnosis not present

## 2021-04-27 DIAGNOSIS — E611 Iron deficiency: Secondary | ICD-10-CM

## 2021-04-27 DIAGNOSIS — E538 Deficiency of other specified B group vitamins: Secondary | ICD-10-CM

## 2021-04-27 DIAGNOSIS — E559 Vitamin D deficiency, unspecified: Secondary | ICD-10-CM

## 2021-04-27 DIAGNOSIS — E785 Hyperlipidemia, unspecified: Secondary | ICD-10-CM | POA: Diagnosis not present

## 2021-04-27 DIAGNOSIS — R8290 Unspecified abnormal findings in urine: Secondary | ICD-10-CM | POA: Diagnosis not present

## 2021-04-27 LAB — TSH: TSH: 0.73 u[IU]/mL (ref 0.35–5.50)

## 2021-04-27 LAB — COMPREHENSIVE METABOLIC PANEL
ALT: 19 U/L (ref 0–53)
AST: 19 U/L (ref 0–37)
Albumin: 4.1 g/dL (ref 3.5–5.2)
Alkaline Phosphatase: 76 U/L (ref 39–117)
BUN: 13 mg/dL (ref 6–23)
CO2: 28 mEq/L (ref 19–32)
Calcium: 9.2 mg/dL (ref 8.4–10.5)
Chloride: 106 mEq/L (ref 96–112)
Creatinine, Ser: 0.93 mg/dL (ref 0.40–1.50)
GFR: 94.41 mL/min (ref >60.00)
Glucose, Bld: 97 mg/dL (ref 70–99)
Potassium: 4.2 mEq/L (ref 3.5–5.1)
Sodium: 141 mEq/L (ref 135–145)
Total Bilirubin: 0.3 mg/dL (ref 0.2–1.2)
Total Protein: 6.8 g/dL (ref 6.0–8.3)

## 2021-04-27 LAB — VITAMIN B12: Vitamin B-12: 333 pg/mL (ref 211–911)

## 2021-04-27 LAB — CBC WITH DIFFERENTIAL/PLATELET
Basophils Absolute: 0 10*3/uL (ref 0.0–0.1)
Basophils Relative: 0.4 % (ref 0.0–3.0)
Eosinophils Absolute: 0.1 10*3/uL (ref 0.0–0.7)
Eosinophils Relative: 1.5 % (ref 0.0–5.0)
HCT: 39.2 % (ref 39.0–52.0)
Hemoglobin: 13.1 g/dL (ref 13.0–17.0)
Lymphocytes Relative: 42.5 % (ref 12.0–46.0)
Lymphs Abs: 2.6 10*3/uL (ref 0.7–4.0)
MCHC: 33.4 g/dL (ref 30.0–36.0)
MCV: 83.8 fl (ref 78.0–100.0)
Monocytes Absolute: 0.5 10*3/uL (ref 0.1–1.0)
Monocytes Relative: 8.7 % (ref 3.0–12.0)
Neutro Abs: 2.8 10*3/uL (ref 1.4–7.7)
Neutrophils Relative %: 46.9 % (ref 43.0–77.0)
Platelets: 248 10*3/uL (ref 150.0–400.0)
RBC: 4.68 Mil/uL (ref 4.22–5.81)
RDW: 14 % (ref 11.5–15.5)
WBC: 6 10*3/uL (ref 4.0–10.5)

## 2021-04-27 LAB — HEMOGLOBIN A1C: Hgb A1c MFr Bld: 6.1 % (ref 4.6–6.5)

## 2021-04-27 LAB — LIPID PANEL
Cholesterol: 148 mg/dL (ref 0–200)
HDL: 50.7 mg/dL (ref >39.00)
LDL Cholesterol: 75 mg/dL (ref 0–99)
NonHDL: 97.15
Total CHOL/HDL Ratio: 3
Triglycerides: 110 mg/dL (ref 0.0–149.0)
VLDL: 22 mg/dL (ref 0.0–40.0)

## 2021-04-27 LAB — VITAMIN D 25 HYDROXY (VIT D DEFICIENCY, FRACTURES): VITD: 56.64 ng/mL (ref 30.00–100.00)

## 2021-04-28 LAB — URINE CULTURE
MICRO NUMBER:: 12081786
Result:: NO GROWTH
SPECIMEN QUALITY:: ADEQUATE

## 2021-04-28 LAB — IRON,TIBC AND FERRITIN PANEL
%SAT: 28 % (calc) (ref 20–48)
Ferritin: 70 ng/mL (ref 38–380)
Iron: 91 ug/dL (ref 50–180)
TIBC: 327 mcg/dL (calc) (ref 250–425)

## 2021-04-29 ENCOUNTER — Other Ambulatory Visit: Payer: Self-pay

## 2021-04-29 ENCOUNTER — Encounter: Payer: Self-pay | Admitting: Internal Medicine

## 2021-04-29 ENCOUNTER — Ambulatory Visit (INDEPENDENT_AMBULATORY_CARE_PROVIDER_SITE_OTHER): Payer: Medicare Other | Admitting: Internal Medicine

## 2021-04-29 ENCOUNTER — Ambulatory Visit: Payer: Medicare Other | Admitting: Internal Medicine

## 2021-04-29 VITALS — BP 96/64 | HR 76 | Temp 97.6°F | Ht 68.0 in | Wt 201.4 lb

## 2021-04-29 DIAGNOSIS — Z1211 Encounter for screening for malignant neoplasm of colon: Secondary | ICD-10-CM

## 2021-04-29 DIAGNOSIS — I959 Hypotension, unspecified: Secondary | ICD-10-CM | POA: Diagnosis not present

## 2021-04-29 DIAGNOSIS — Z550 Illiteracy and low-level literacy: Secondary | ICD-10-CM | POA: Insufficient documentation

## 2021-04-29 DIAGNOSIS — E538 Deficiency of other specified B group vitamins: Secondary | ICD-10-CM | POA: Diagnosis not present

## 2021-04-29 DIAGNOSIS — Z0000 Encounter for general adult medical examination without abnormal findings: Secondary | ICD-10-CM

## 2021-04-29 DIAGNOSIS — I10 Essential (primary) hypertension: Secondary | ICD-10-CM

## 2021-04-29 MED ORDER — LISINOPRIL-HYDROCHLOROTHIAZIDE 10-12.5 MG PO TABS: 0.5000 | ORAL_TABLET | Freq: Every day | ORAL | 3 refills | Status: DC

## 2021-04-29 NOTE — Patient Instructions (Addendum)
Take pills to pharmacy to figure out what they are   Call and reschedule your colonoscopy   11/03/2020 Telephone [facility]   [place], [place] [address]   [place], [place], [address] [place], [place] [address]   [phone] (Work)   [phone] (Fax)
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
## 2021-05-11 NOTE — Progress Notes (Signed)
Called and spoke with [place] and he confirmed his 08/10/21 appointment.

## 2021-05-19 DIAGNOSIS — M25511 Pain in right shoulder: Secondary | ICD-10-CM | POA: Diagnosis not present

## 2021-05-19 DIAGNOSIS — M79662 Pain in left lower leg: Secondary | ICD-10-CM | POA: Diagnosis not present

## 2021-05-19 DIAGNOSIS — M47816 Spondylosis without myelopathy or radiculopathy, lumbar region: Secondary | ICD-10-CM | POA: Diagnosis not present

## 2021-05-19 DIAGNOSIS — G894 Chronic pain syndrome: Secondary | ICD-10-CM | POA: Diagnosis not present

## 2021-05-25 ENCOUNTER — Ambulatory Visit (INDEPENDENT_AMBULATORY_CARE_PROVIDER_SITE_OTHER): Payer: Medicare Other | Admitting: Surgical

## 2021-05-25 ENCOUNTER — Other Ambulatory Visit: Payer: Self-pay

## 2021-05-25 ENCOUNTER — Encounter: Payer: Self-pay | Admitting: Surgical

## 2021-05-25 DIAGNOSIS — S81802D Unspecified open wound, left lower leg, subsequent encounter: Secondary | ICD-10-CM | POA: Diagnosis not present

## 2021-05-25 NOTE — Progress Notes (Signed)
   Referring Provider McLean-Scocuzza, [name], MD [place],  [place] [address]   CC:  Chief Complaint  Patient presents with   Follow-up      William Chase is an 55 y.o. male.  HPI: Patient is a 55 year old male here for follow-up on his left lower extremity wound.  He has history of free muscle flap to the left lower extremity after an injury many years ago.  He has no sensation to this area.  He has had multiple setbacks in the wound healing process due to the lack of sensation.  We have been using collagen dressing changes.  He reports today that he is doing well, he has been keeping the area covered with a bandage but has no longer needed to use collagen as the area has scabbed over.  He is not having any infectious symptoms.  He has some questions about ongoing care.  Review of Systems General: No fevers or chills  Physical Exam Vitals with BMI 04/29/2021 04/06/2021 02/16/2021  Height '5\' 8"'$  - -  Weight 201 lbs 6 oz - -  BMI 99991111 - -  Systolic 96 [id number]  Diastolic 64 64 80  Pulse 76 93 74    General:  No acute distress,  Alert and oriented, Non-Toxic, Normal speech and affect Left lower extremity proximal wound has completely healed, no wounds noted.  Scabbing noted on distal left lower extremity.  No cellulitic changes or erythema noted.  No foul odor is noted.  No purulence noted.  No swelling noted.       Assessment/Plan Recommend keeping the area covered with a bandage for the next 2 weeks. Recommend following up on an as-needed basis.  No sign of infection.  Discussed with patient that swimming is only advisable if he has no open wounds.  Recommend calling with questions or concerns.  Pictures were taken and placed in the patient's chart with patient's permission.  Carola Rhine Minoru Chap 05/25/2021, 11:43 AM

## 2021-06-01 ENCOUNTER — Telehealth: Payer: Self-pay | Admitting: Internal Medicine

## 2021-06-01 NOTE — Telephone Encounter (Signed)
New referral placed.

## 2021-06-01 NOTE — Telephone Encounter (Signed)
I spoke with pt he would like a new referral to [facility] clinic Dr [name] for a colonoscopy. He had a referral prior pt had to cancel appt due to his mom. And the referral expired. Thank you!  Call pt at [phone].

## 2021-06-01 NOTE — Addendum Note (Signed)
Addended by: Thressa Sheller on: 06/01/2021 01:51 PM   Modules accepted: Orders

## 2021-06-03 ENCOUNTER — Encounter (HOSPITAL_COMMUNITY): Payer: Self-pay

## 2021-06-03 ENCOUNTER — Other Ambulatory Visit: Payer: Self-pay

## 2021-06-03 ENCOUNTER — Emergency Department (HOSPITAL_COMMUNITY)
Admission: EM | Admit: 2021-06-03 | Discharge: 2021-06-03 | Disposition: A | Discharge status: Home / Self Care | Payer: Medicare Other | Attending: Emergency Medicine | Admitting: Emergency Medicine

## 2021-06-03 ENCOUNTER — Telehealth: Payer: Self-pay | Admitting: Internal Medicine

## 2021-06-03 DIAGNOSIS — Y92009 Unspecified place in unspecified non-institutional (private) residence as the place of occurrence of the external cause: Secondary | ICD-10-CM | POA: Diagnosis not present

## 2021-06-03 DIAGNOSIS — S80812A Abrasion, left lower leg, initial encounter: Secondary | ICD-10-CM | POA: Diagnosis not present

## 2021-06-03 DIAGNOSIS — Z23 Encounter for immunization: Secondary | ICD-10-CM | POA: Diagnosis not present

## 2021-06-03 DIAGNOSIS — I10 Essential (primary) hypertension: Secondary | ICD-10-CM | POA: Insufficient documentation

## 2021-06-03 DIAGNOSIS — Z87891 Personal history of nicotine dependence: Secondary | ICD-10-CM | POA: Diagnosis not present

## 2021-06-03 DIAGNOSIS — W540XXA Bitten by dog, initial encounter: Secondary | ICD-10-CM | POA: Insufficient documentation

## 2021-06-03 DIAGNOSIS — S8992XA Unspecified injury of left lower leg, initial encounter: Secondary | ICD-10-CM | POA: Diagnosis present

## 2021-06-03 DIAGNOSIS — Z79899 Other long term (current) drug therapy: Secondary | ICD-10-CM | POA: Diagnosis not present

## 2021-06-03 DIAGNOSIS — S81851A Open bite, right lower leg, initial encounter: Secondary | ICD-10-CM | POA: Diagnosis not present

## 2021-06-03 MED ORDER — TETANUS-DIPHTH-ACELL PERTUSSIS 5-2.5-18.5 LF-MCG/0.5 IM SUSY
0.5000 mL | PREFILLED_SYRINGE | Freq: Once | INTRAMUSCULAR | Status: AC
  Administered 2021-06-03: 0.5 mL via INTRAMUSCULAR
  Filled 2021-06-03: qty 0.5

## 2021-06-03 MED ORDER — AMOXICILLIN-POT CLAVULANATE 875-125 MG PO TABS: 1.0000 | ORAL_TABLET | Freq: Two times a day (BID) | ORAL | 0 refills | Status: AC

## 2021-06-03 NOTE — Telephone Encounter (Signed)
Agree with ED visit will also need to follow up with [facility] for wound  Can plastics reach out to him?

## 2021-06-03 NOTE — ED Triage Notes (Signed)
Pt states that at approximately 0130 this morning he woke up to his dog biting on his leg. Bandaged in triage, but evaluated and no bleeding noted at this time. Re-bandaged by ED tech. States this is the second occurrence to the same spot. Pt states his dog is up to date on vaccinations.

## 2021-06-03 NOTE — Telephone Encounter (Signed)
Patient informed and verbalized understanding

## 2021-06-03 NOTE — ED Provider Notes (Signed)
Emergency Medicine Provider Triage Evaluation Note  William Chase , a 55 y.o. male  was evaluated in triage.  Pt complains of dog bite.  He states that he woke up about 0130 this morning to his dog chewing on his left leg.  This is insensate at baseline due to prior injury.  This is not the first time he is woken up with his dog chewing on his leg in this area.  He previously required plastics follow-up for repair.  No changes in sensation or motor per patient.  No other concerns today.  Dog is up to date on all its vaccines.  Per chart review last tetanus 2016  Review of Systems  Positive: wound Negative: fever  Physical Exam  BP (!) 139/96 (BP Location: Left Arm)   Pulse 77   Temp 98 F (36.7 C)   Resp 14   Ht '5\' 8"'$  (1.727 m)   Wt 90.7 kg   SpO2 96%   BMI 30.41 kg/m  Gen:   Awake, no distress   Resp:  Normal effort  MSK:   Moves extremities without difficulty  Other:  Left shin has anterior area of about 5 to 6 cm x 3 cm of macerated tissue. Pulses are diminished bilaterally and DP/PT.  Pulses able to be doppler by triage staff.    Medical Decision Making  Medically screening exam initiated at 9:32 AM.  Appropriate orders placed.  OWIN HURNEY was informed that the remainder of the evaluation will be completed by another provider, this initial triage assessment does not replace that evaluation, and the importance of remaining in the ED until their evaluation is complete.  Note: Portions of this report may have been transcribed using voice recognition software. Every effort was made to ensure accuracy; however, inadvertent computerized transcription errors may be present    Ollen Gross 06/03/21 1007    Truddie Hidden, MD 06/03/21 1158

## 2021-06-03 NOTE — Discharge Instructions (Addendum)
Return if any problems.

## 2021-06-03 NOTE — Telephone Encounter (Signed)
Patient called back to let us know he went to the Emergency room for the note below.

## 2021-06-03 NOTE — Telephone Encounter (Signed)
Patient informed, Due to the high volume of calls and your symptoms we have to forward your call to our Triage Nurse to expedient your call. Please hold for the transfer.  Patient transferred to [facility]. Due to his dog chewing on his leg and having bleeding with muscle showing.No openings in office or virtual.
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
## 2021-06-05 NOTE — Telephone Encounter (Signed)
Please reach out to the patient and see how his leg is doing.  Plastic surgery recommended that he see the wound clinic.  I would recommend referral to the wound clinic.  Is the patient okay with the referral being placed?

## 2021-06-07 NOTE — Telephone Encounter (Signed)
Noted  

## 2021-06-07 NOTE — Telephone Encounter (Signed)
Called and informed Patient of the above. States he is dressing and cleaning the wound like he was before. Declines referral.   Informed the Patient that due to the history and severity of the wound a referral I recommended. Patient states he has too much going on right now and he will be fine. States he has nothing to drive. Informed the Patient that we could put in a referral for [place]. Patient declined and states thank you for calling to check on him. States he will call back if he needs anything.   Patient then disconnected the call.

## 2021-06-08 ENCOUNTER — Telehealth: Payer: Self-pay

## 2021-06-08 NOTE — Telephone Encounter (Signed)
Patient requested a follow-up appointment with [place] following his visit to the ED.  I called patient to schedule a follow-up appointment and ask about best location for wound care referral per Roetta Sessions, PA-C.  Patient said that he doesn't think that his dog bit him.  He believes that he scratched it with his toenail while he was asleep.  Patient said his dog was licking his wound when he woke up.  Patient declined wound care referral.  Patient said that he does not have transportation at this time, so he is bandaging the wound himself the way he did it with the previous wound, using the supplies he has left.  Patient does not think he needs to be seen by [place] at this time.

## 2021-06-16 ENCOUNTER — Ambulatory Visit (INDEPENDENT_AMBULATORY_CARE_PROVIDER_SITE_OTHER): Payer: Medicare Other | Admitting: Surgical

## 2021-06-16 ENCOUNTER — Other Ambulatory Visit: Payer: Self-pay

## 2021-06-16 ENCOUNTER — Encounter: Payer: Self-pay | Admitting: Surgical

## 2021-06-16 ENCOUNTER — Telehealth: Payer: Self-pay

## 2021-06-16 DIAGNOSIS — R413 Other amnesia: Secondary | ICD-10-CM

## 2021-06-16 DIAGNOSIS — S81802D Unspecified open wound, left lower leg, subsequent encounter: Secondary | ICD-10-CM

## 2021-06-16 NOTE — Telephone Encounter (Addendum)
Faxed order to [facility] for: 4x4 guaze, ace wrap, saline.  Change every other day.
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
## 2021-06-17 DIAGNOSIS — S81802A Unspecified open wound, left lower leg, initial encounter: Secondary | ICD-10-CM | POA: Diagnosis not present

## 2021-06-17 DIAGNOSIS — M5412 Radiculopathy, cervical region: Secondary | ICD-10-CM | POA: Diagnosis not present

## 2021-06-17 DIAGNOSIS — M7542 Impingement syndrome of left shoulder: Secondary | ICD-10-CM | POA: Diagnosis not present

## 2021-07-07 ENCOUNTER — Ambulatory Visit (INDEPENDENT_AMBULATORY_CARE_PROVIDER_SITE_OTHER): Payer: Medicare Other | Admitting: Surgical

## 2021-07-07 ENCOUNTER — Other Ambulatory Visit: Payer: Self-pay

## 2021-07-07 ENCOUNTER — Encounter: Payer: Self-pay | Admitting: Surgical

## 2021-07-07 DIAGNOSIS — S81802D Unspecified open wound, left lower leg, subsequent encounter: Secondary | ICD-10-CM

## 2021-07-07 DIAGNOSIS — R413 Other amnesia: Secondary | ICD-10-CM | POA: Diagnosis not present

## 2021-07-07 NOTE — Progress Notes (Signed)
   Referring Provider McLean-Scocuzza, [name], MD [place],  [place] [address]   CC: No chief complaint on file.     GARNET ZAMORSKI is an 55 y.o. male.  HPI: 55 year old male here for evaluation of his left lower extremity.  He has been doing collagen dressing changes every other day.  He feels as if things are going well.  He is not having any issues.  He is not having infectious symptoms.   Review of Systems General: No fevers or chills  Physical Exam Vitals with BMI 06/03/2021 06/03/2021 06/03/2021  Height - - '5\' 8"'$   Weight - - 200 lbs  BMI - - Q000111Q  Systolic [id number] 0000000 -  Diastolic 72 86 -  Pulse 83 78 -    General:  No acute distress,  Alert and oriented, Non-Toxic, Normal speech and affect Left lower extremity: Left lower extremity wound with new epithelialization noted.  The wound is 2.5 x 3 cm with no surrounding erythema or cellulitic changes noted.  There is a good base of granulation tissue noted.  No nonviable tissue is noted.  Assessment/Plan 55 year old male here for follow-up on his left lower extremity wound.  He is doing well.  There is no sign of infection on exam.  Recommend continuing with collagen dressing changes every other day, he has made good progress.  Recommend following up in 3 weeks for reevaluation.  Call with questions or concerns.  Picture was taken and placed in the patient's chart with patient's permission.  Carola Rhine Tyger Oka 07/07/2021, 10:30 AM

## 2021-07-08 DIAGNOSIS — M7541 Impingement syndrome of right shoulder: Secondary | ICD-10-CM | POA: Diagnosis not present

## 2021-07-21 ENCOUNTER — Other Ambulatory Visit: Payer: Self-pay

## 2021-07-21 ENCOUNTER — Ambulatory Visit: Payer: Medicare Other | Admitting: Surgical

## 2021-07-21 ENCOUNTER — Encounter: Payer: Self-pay | Admitting: Surgical

## 2021-07-21 DIAGNOSIS — M25511 Pain in right shoulder: Secondary | ICD-10-CM | POA: Diagnosis not present

## 2021-07-21 DIAGNOSIS — S81802D Unspecified open wound, left lower leg, subsequent encounter: Secondary | ICD-10-CM

## 2021-07-21 DIAGNOSIS — M79662 Pain in left lower leg: Secondary | ICD-10-CM | POA: Diagnosis not present

## 2021-07-21 DIAGNOSIS — M47816 Spondylosis without myelopathy or radiculopathy, lumbar region: Secondary | ICD-10-CM | POA: Diagnosis not present

## 2021-07-21 DIAGNOSIS — G894 Chronic pain syndrome: Secondary | ICD-10-CM | POA: Diagnosis not present

## 2021-07-21 DIAGNOSIS — Z79891 Long term (current) use of opiate analgesic: Secondary | ICD-10-CM | POA: Diagnosis not present
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
## 2021-08-10 ENCOUNTER — Ambulatory Visit: Payer: Medicare Other

## 2021-08-11 ENCOUNTER — Ambulatory Visit (INDEPENDENT_AMBULATORY_CARE_PROVIDER_SITE_OTHER): Payer: Medicare Other | Admitting: Surgical

## 2021-08-11 ENCOUNTER — Other Ambulatory Visit: Payer: Self-pay

## 2021-08-11 DIAGNOSIS — R413 Other amnesia: Secondary | ICD-10-CM

## 2021-08-11 DIAGNOSIS — S81802D Unspecified open wound, left lower leg, subsequent encounter: Secondary | ICD-10-CM

## 2021-08-11 NOTE — Progress Notes (Signed)
   Referring Provider McLean-Scocuzza, [name], MD [place],  [place] [address]   CC:  Chief Complaint  Patient presents with   Follow-up      William Chase is an 55 y.o. male.  HPI: Patient is a 55 year old male here for follow-up on his left lower extremity wound.  He is doing well.  He has continue with collagen dressing changes and feels as if he has noticed an improvement.  He is not having any infectious symptoms.  He has no concerns today.  Review of Systems General: No fevers or chills  Physical Exam Vitals with BMI 06/03/2021 06/03/2021 06/03/2021  Height - - 5\' 8"   Weight - - 200 lbs  BMI - - 77.93  Systolic 903 009 -  Diastolic 72 86 -  Pulse 83 78 -    General:  No acute distress,  Alert and oriented, Non-Toxic, Normal speech and affect Left lower extremity: Left lower extremity wound is approximately 1 x 2 cm.  There is no surrounding erythema or cellulitic changes noted.  There is no foul odor is noted.  There is no purulent drainage.  New epithelialization noted  Assessment/Plan Recommend continue with collagen dressing changes every other day.  Recommend following up in 1 month for reevaluation.  Recommend calling with questions or concerns.  Recommend continue to keep it wrapped and protected from additional injuries.   Carola Rhine Alara Daniel 08/11/2021, 2:18 PM

## 2021-08-16 ENCOUNTER — Telehealth: Payer: Self-pay | Admitting: Internal Medicine

## 2021-08-16 NOTE — Telephone Encounter (Signed)
Pt is calling in due to bad urine smell. Pt stated that he is not on new medication. Pt takes One A Day for men over 50 vitamins for heart, eye, blood. Pt takes Vitamin D3 and Biotin. Transfer PT to access nurse to be triage.

## 2021-08-16 NOTE — Telephone Encounter (Signed)
Called to speak with William Chase. He states that he is currently taking Multivitamin for Men over 50, Biotin, and Vitamin D3. Pt states that he is having Urinary odor with no pain or urgency. Pt states that he believes it to be the multivitamins that he is taking. Pt declines going to the urgent care for an evaluation and states that he believes it to be his multivitamins as when he was taking his Vitamin D3 and biotin, his urine did not have an odor. Pt also declined stopping the multivitamins and stated that he will continue to take them until completion and then he will not buy any more. Pt again declined going to the urgent care for an evaluation and states that it is too expensive. Pt had no other concerns at this time.

## 2021-08-17 NOTE — Telephone Encounter (Signed)
Ok to order urine culture and UA quest please have pt do these  If + will need to schedule visit work in  Or  Sch appt

## 2021-08-17 NOTE — Telephone Encounter (Signed)
Called and spoke with the Patient. Declines appointment and states his only symptom is the odor. No frequency, discoloration, no pressure, no pain, no blood, etc.   States the odor started the same week he started to supplements. States he is about to be out of these vitamins and will stop taking them. Patient informed should he develop new symptoms, symptoms worsen, or not resolve after stopping medications he needs to be seen. Patient verbalized understanding  For your information

## 2021-08-17 NOTE — Telephone Encounter (Signed)
Providing Access Nurse Documentation.   

## 2021-08-20 ENCOUNTER — Other Ambulatory Visit: Payer: Self-pay | Admitting: Dermatology

## 2021-08-20 DIAGNOSIS — L219 Seborrheic dermatitis, unspecified: Secondary | ICD-10-CM

## 2021-09-14 ENCOUNTER — Ambulatory Visit: Payer: Medicare Other | Admitting: Adult Health

## 2021-09-15 ENCOUNTER — Ambulatory Visit: Payer: Medicare Other | Admitting: Surgical

## 2021-09-20 DIAGNOSIS — G894 Chronic pain syndrome: Secondary | ICD-10-CM | POA: Diagnosis not present

## 2021-09-20 DIAGNOSIS — M47816 Spondylosis without myelopathy or radiculopathy, lumbar region: Secondary | ICD-10-CM | POA: Diagnosis not present

## 2021-09-20 DIAGNOSIS — M25511 Pain in right shoulder: Secondary | ICD-10-CM | POA: Diagnosis not present

## 2021-09-20 DIAGNOSIS — M79662 Pain in left lower leg: Secondary | ICD-10-CM | POA: Diagnosis not present

## 2021-09-22 ENCOUNTER — Ambulatory Visit (INDEPENDENT_AMBULATORY_CARE_PROVIDER_SITE_OTHER): Payer: Medicare Other | Admitting: Surgical

## 2021-09-22 ENCOUNTER — Other Ambulatory Visit: Payer: Self-pay

## 2021-09-22 ENCOUNTER — Encounter: Payer: Self-pay | Admitting: Surgical

## 2021-09-22 DIAGNOSIS — S81802D Unspecified open wound, left lower leg, subsequent encounter: Secondary | ICD-10-CM | POA: Diagnosis not present

## 2021-09-22 NOTE — Progress Notes (Signed)
   Referring Provider McLean-Scocuzza, [name], MD [place],  [place] [address]   CC: No chief complaint on file.     William Chase is an 55 y.o. male.  HPI: 55 year old male here for follow-up on his left lower extremity wound.  Patient reports he is overall doing well.  He reports he had a cold over Thanksgiving but is feeling better now.  He feels as if things are going well.  He is out of dressing supplies  Review of Systems General: No fevers or chills  Physical Exam Vitals with BMI 06/03/2021 06/03/2021 06/03/2021  Height - - 5\' 8"   Weight - - 200 lbs  BMI - - 07.86  Systolic 754 492 -  Diastolic 72 86 -  Pulse 83 78 -    General:  No acute distress,  Alert and oriented, Non-Toxic, Normal speech and affect Left lower extremity wound with large scab over the anterior shin.  This was removed to reveal a small wound at the inferior most portion of the left lower extremity wound.  The overall wound is approximately 2 x 3 cm.  There is no surrounding erythema or cellulitic changes.  There is no purulence.  There is no foul odors.    Assessment/Plan 55 year old male with left lower extremity wound, he had a significant scab over the anterior shin today, this was removed to reveal a small wound at the inferior portion of the shin where he previously had the wound at his last appointment.  Unclear etiology for this large scab.    Recommend continue with collagen dressing changes every other day.  Recommend continuing to keep this area wrapped and protected.  Recommend following up in 5 weeks for reevaluation.  Pictures were taken and placed in the patient's chart with patient's permission.  Recommend reaching out to [facility] to see if they are able to send additional shipment without additional order.  If he needs additional order we will be happy to send one for him.  The order would consist of: Collagen, 4 x 4 gauze, Kerlix 4 inch and 4 inch Ace wraps.  William Chase  William Chase 09/22/2021, 3:18 PM

## 2021-09-23 ENCOUNTER — Telehealth: Payer: Self-pay | Admitting: Plastic Surgery

## 2021-09-23 NOTE — Telephone Encounter (Signed)
Faxed new order to [facility] and received success confirmation. Called pt and informed him that a new order has been sent to [facility] and to be on the look out for a call from them. Pt conveyed understanding.   Forwarded [facility] order to clinical to scan folder @ [facility].

## 2021-09-23 NOTE — Telephone Encounter (Signed)
Patient left a voicemail saying that they have contacted [facility] and they told him that the doctor will need to call in new order forms because his is now outdated.  Please follow up

## 2021-09-24 DIAGNOSIS — S81802A Unspecified open wound, left lower leg, initial encounter: Secondary | ICD-10-CM | POA: Diagnosis not present

## 2021-10-06 ENCOUNTER — Ambulatory Visit: Payer: Medicare Other | Admitting: Anesthesiology

## 2021-10-06 ENCOUNTER — Encounter: Payer: Self-pay | Admitting: Internal Medicine

## 2021-10-06 ENCOUNTER — Encounter
Admission: RE | Disposition: A | Discharge status: Home / Self Care | Payer: Self-pay | Source: Ambulatory Visit | Attending: Internal Medicine

## 2021-10-06 ENCOUNTER — Ambulatory Visit
Admission: RE | Admit: 2021-10-06 | Discharge: 2021-10-06 | Disposition: A | Discharge status: Home / Self Care | Payer: Medicare Other | Source: Ambulatory Visit | Attending: Internal Medicine | Admitting: Internal Medicine

## 2021-10-06 DIAGNOSIS — K640 First degree hemorrhoids: Secondary | ICD-10-CM | POA: Diagnosis not present

## 2021-10-06 DIAGNOSIS — Z1211 Encounter for screening for malignant neoplasm of colon: Secondary | ICD-10-CM | POA: Diagnosis not present

## 2021-10-06 DIAGNOSIS — K5730 Diverticulosis of large intestine without perforation or abscess without bleeding: Secondary | ICD-10-CM | POA: Diagnosis not present

## 2021-10-06 DIAGNOSIS — K635 Polyp of colon: Secondary | ICD-10-CM | POA: Diagnosis not present

## 2021-10-06 DIAGNOSIS — D126 Benign neoplasm of colon, unspecified: Secondary | ICD-10-CM | POA: Diagnosis not present

## 2021-10-06 DIAGNOSIS — K649 Unspecified hemorrhoids: Secondary | ICD-10-CM | POA: Diagnosis not present

## 2021-10-06 DIAGNOSIS — K219 Gastro-esophageal reflux disease without esophagitis: Secondary | ICD-10-CM | POA: Diagnosis not present

## 2021-10-06 HISTORY — PX: COLONOSCOPY WITH PROPOFOL: SHX5780

## 2021-10-06 LAB — HM COLONOSCOPY

## 2021-10-06 SURGERY — COLONOSCOPY WITH PROPOFOL
Anesthesia: General

## 2021-10-06 MED ORDER — PROPOFOL 500 MG/50ML IV EMUL
INTRAVENOUS | Status: DC | PRN
  Administered 2021-10-06: 200 ug/kg/min via INTRAVENOUS

## 2021-10-06 MED ORDER — PROPOFOL 10 MG/ML IV BOLUS
INTRAVENOUS | Status: AC
  Filled 2021-10-06: qty 20

## 2021-10-06 MED ORDER — SODIUM CHLORIDE 0.9 % IV SOLN
INTRAVENOUS | Status: DC
  Administered 2021-10-06: 13:00:00 1000 mL via INTRAVENOUS

## 2021-10-06 MED ORDER — PROPOFOL 10 MG/ML IV BOLUS
INTRAVENOUS | Status: DC | PRN
  Administered 2021-10-06: 100 mg via INTRAVENOUS
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
## 2021-10-06 NOTE — Interval H&P Note (Signed)
History and Physical Interval Note:  10/06/2021 1:56 PM  William Chase  has presented today for surgery, with the diagnosis of colon cancer screening.  The various methods of treatment have been discussed with the patient and family. After consideration of risks, benefits and other options for treatment, the patient has consented to  Procedure(s): COLONOSCOPY WITH PROPOFOL (N/A) as a surgical intervention.  The patient's history has been reviewed, patient examined, no change in status, stable for surgery.  I have reviewed the patient's chart and labs.  Questions were answered to the patient's satisfaction.     [place], [place]

## 2021-10-06 NOTE — Op Note (Signed)
[facility] Gastroenterology Patient Name: William Chase Procedure Date: 10/06/2021 1:55 PM MRN: [id number] Account #: [id number] Date of Birth: 09/07/1966 Admit Type: Outpatient Age: 55 Room: [facility] ENDO ROOM 2 Gender: Male Note Status: Finalized Instrument Name: Jasper Riling [id number] Procedure:             Colonoscopy Indications:           Screening for colorectal malignant neoplasm Providers:             Lorie Apley K. [name] MD, MD Referring MD:          [name] Mclean-Scocuzza MD, MD (Referring MD) Medicines:             Propofol per Anesthesia Complications:         No immediate complications. Procedure:             Pre-Anesthesia Assessment:                        - The risks and benefits of the procedure and the                         sedation options and risks were discussed with the                         patient. All questions were answered and informed                         consent was obtained.                        - Patient identification and proposed procedure were                         verified prior to the procedure by the nurse. The                         procedure was verified in the procedure room.                        - ASA Grade Assessment: III - A patient with severe                         systemic disease.                        - After reviewing the risks and benefits, the patient                         was deemed in satisfactory condition to undergo the                         procedure.                        After obtaining informed consent, the colonoscope was                         passed under direct vision. Throughout the procedure,  the patient's blood pressure, pulse, and oxygen                         saturations were monitored continuously. The                         Colonoscope was introduced through the anus and                         advanced to the the cecum, identified by appendiceal                          orifice and ileocecal valve. The colonoscopy was                         performed without difficulty. The patient tolerated                         the procedure well. The quality of the bowel                         preparation was good. The ileocecal valve, appendiceal                         orifice, and rectum were photographed. Findings:      The perianal and digital rectal examinations were normal. Pertinent       negatives include normal sphincter tone and no palpable rectal lesions.      Non-bleeding internal hemorrhoids were found during retroflexion. The       hemorrhoids were Grade I (internal hemorrhoids that do not prolapse).      A few small-mouthed diverticula were found in the sigmoid colon and       transverse colon. There was no evidence of diverticular bleeding.      A 3 mm polyp was found in the ileocecal valve. The polyp was sessile.       The polyp was removed with a jumbo cold forceps. Resection and retrieval       were complete.      The exam was otherwise without abnormality. Impression:            - Non-bleeding internal hemorrhoids.                        - Mild diverticulosis in the sigmoid colon and in the                         transverse colon. There was no evidence of                         diverticular bleeding.                        - One 3 mm polyp at the ileocecal valve, removed with                         a jumbo cold forceps. Resected and retrieved.                        - The examination was otherwise normal.  Recommendation:        - Patient has a contact number available for                         emergencies. The signs and symptoms of potential                         delayed complications were discussed with the patient.                         Return to normal activities tomorrow. Written                         discharge instructions were provided to the patient.                        - Resume previous diet.                         - Continue present medications.                        - Repeat colonoscopy is recommended for surveillance.                         The colonoscopy date will be determined after                         pathology results from today's exam become available                         for review.                        - Return to GI office PRN.                        - The findings and recommendations were discussed with                         the patient. Procedure Code(s):     --- Professional ---                        [phone], Colonoscopy, flexible; with biopsy, single or                         multiple Diagnosis Code(s):     --- Professional ---                        K57.30, Diverticulosis of large intestine without                         perforation or abscess without bleeding                        K63.5, Polyp of colon                        K64.0, First degree hemorrhoids  Z12.11, Encounter for screening for malignant neoplasm                         of colon CPT copyright 2019 [employer]. All rights reserved. The codes documented in this report are preliminary and upon coder review may  be revised to meet current compliance requirements. Efrain Sella MD, MD 10/06/2021 2:28:38 PM This report has been signed electronically. Number of Addenda: 0 Note Initiated On: 10/06/2021 1:55 PM Scope Withdrawal Time: 0 hours 6 minutes 26 seconds  Total Procedure Duration: 0 hours 10 minutes 13 seconds  Estimated Blood Loss:  Estimated blood loss: none.      [facility]
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
## 2021-10-06 NOTE — Transfer of Care (Signed)
Immediate Anesthesia Transfer of Care Note  Patient: William Chase  Procedure(s) Performed: COLONOSCOPY WITH PROPOFOL  Patient Location: PACU  Anesthesia Type:General  Level of Consciousness: awake and alert   Airway & Oxygen Therapy: Patient Spontanous Breathing and Patient connected to nasal cannula oxygen  Post-op Assessment: Report given to RN and Post -op Vital signs reviewed and stable  Post vital signs: Reviewed and stable  Last Vitals:  Vitals Value Taken Time  BP 104/61 10/06/21 1427  Temp    Pulse 71 10/06/21 1427  Resp 17 10/06/21 1427  SpO2 96 % 10/06/21 1427  Vitals shown include unvalidated device data.  Last Pain:  Vitals:   10/06/21 1302  PainSc: 0-No pain      Patients Stated Pain Goal: 0 (87/68/11 5726)  Complications: No notable events documented.

## 2021-10-07 ENCOUNTER — Encounter: Payer: Self-pay | Admitting: Internal Medicine

## 2021-10-07 NOTE — Anesthesia Postprocedure Evaluation (Signed)
Anesthesia Post Note  Patient: William Chase  Procedure(s) Performed: COLONOSCOPY WITH PROPOFOL  Patient location during evaluation: Endoscopy Anesthesia Type: General Level of consciousness: awake and alert Pain management: pain level controlled Vital Signs Assessment: post-procedure vital signs reviewed and stable Respiratory status: spontaneous breathing, nonlabored ventilation and respiratory function stable Cardiovascular status: blood pressure returned to baseline and stable Postop Assessment: no apparent nausea or vomiting Anesthetic complications: no   No notable events documented.   Last Vitals:  Vitals:   10/06/21 1447 10/06/21 1507  BP: 121/76   Pulse:    Resp:  (!) 21  Temp:    SpO2: 96%     Last Pain:  Vitals:   10/07/21 0741  TempSrc:   PainSc: 0-No pain                 [name]

## 2021-10-08 LAB — SURGICAL PATHOLOGY

## 2021-10-29 ENCOUNTER — Ambulatory Visit: Payer: Medicare Other | Admitting: Surgical

## 2021-11-02 ENCOUNTER — Ambulatory Visit (INDEPENDENT_AMBULATORY_CARE_PROVIDER_SITE_OTHER): Payer: Medicare Other | Admitting: Internal Medicine

## 2021-11-02 ENCOUNTER — Encounter: Payer: Self-pay | Admitting: Internal Medicine

## 2021-11-02 ENCOUNTER — Other Ambulatory Visit: Payer: Self-pay

## 2021-11-02 VITALS — Ht 68.0 in | Wt 200.0 lb

## 2021-11-02 DIAGNOSIS — R509 Fever, unspecified: Secondary | ICD-10-CM | POA: Diagnosis not present

## 2021-11-02 DIAGNOSIS — J0110 Acute frontal sinusitis, unspecified: Secondary | ICD-10-CM

## 2021-11-02 DIAGNOSIS — R0981 Nasal congestion: Secondary | ICD-10-CM

## 2021-11-02 DIAGNOSIS — R0989 Other specified symptoms and signs involving the circulatory and respiratory systems: Secondary | ICD-10-CM

## 2021-11-02 DIAGNOSIS — R8290 Unspecified abnormal findings in urine: Secondary | ICD-10-CM

## 2021-11-02 MED ORDER — SALINE SPRAY 0.65 % NA SOLN: 2.0000 | NASAL | 2 refills | Status: DC | PRN

## 2021-11-02 MED ORDER — AZITHROMYCIN 250 MG PO TABS: ORAL_TABLET | ORAL | 0 refills | Status: AC

## 2021-11-02 NOTE — Progress Notes (Signed)
Symptoms include cold sweats, fever, and runny nose. Highest fever read 102 last night.   No one around the Patient is sick. Patient has not been tested for Covid.   Patient has an odor to his urine.

## 2021-11-02 NOTE — Patient Instructions (Addendum)
°  These are over the counter medication options:  Mucinex dm green label for cough or robitussin DM  Multivitamin or below vitamins  Vitamin C 1000 mg daily.  Vitamin D3 4000 Iu (units) daily.  Zinc 100 mg daily.  Quercetin 250-500 mg 2 times per day   Elderberry  Oil of oregano  cepacol or chloroseptic spray Warm salt water gargles +hydrogen peroxide Sugar free cough drops  Warm tea with honey and lemon  Hydration water Try to eat though you dont feel like it   Tylenol or Advil for body aches Nasal saline and Flonase 2 sprays nasal congestion  If sneezing/runny nose over the counter allergy pill claritin,allegra, zyrtec, xyzal
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
## 2021-11-04 ENCOUNTER — Telehealth: Payer: Self-pay | Admitting: Internal Medicine

## 2021-11-04 NOTE — Telephone Encounter (Signed)
Noted  

## 2021-11-04 NOTE — Telephone Encounter (Signed)
Pt called in stating that Dr. Olivia Mackie prescribe him a [facility]). Pt stated that the first day he took 2 and the next 4 days Pt took one. Pt stated when he takes medication he feels sick and can't function. Pt stated that the medication makes his eyes foggy and body feel jittery. Pt stated that he has two pills left. Pt requesting callback.

## 2021-11-04 NOTE — Telephone Encounter (Signed)
Message added to other 11/04/21 telephone encounter

## 2021-11-04 NOTE — Telephone Encounter (Signed)
Called and spoke with the Patient. Patient states that he feels better now that he has eaten. States that he has two pills left and will take them.   States the pills made him feel jittery and some brain fog. States that eating with the medication made him feel better and taking the medication this morning did not bother him.   Patient states that he did not get COVID tested as the pharmacy near him does not do this. Patient states he is overall feeling better as fas as cold symptoms and medication side effects.

## 2021-11-04 NOTE — Telephone Encounter (Signed)
McLean-Scocuzza, [name], MD routed conversation to You 2 hours ago (12:47 PM)   McLean-Scocuzza, [name], MD 2 hours ago (12:47 PM)   TM Pt called cant tolerate z pack what are symptoms  What were results of covid test?  Does he want to try doxycycline?

## 2021-11-04 NOTE — Telephone Encounter (Signed)
Pt called cant tolerate z pack what are symptoms  What were results of covid test?  Does he want to try doxycycline?

## 2021-11-11 ENCOUNTER — Other Ambulatory Visit: Payer: Self-pay

## 2021-11-11 ENCOUNTER — Ambulatory Visit (INDEPENDENT_AMBULATORY_CARE_PROVIDER_SITE_OTHER): Payer: Medicare Other | Admitting: Internal Medicine

## 2021-11-11 ENCOUNTER — Other Ambulatory Visit (HOSPITAL_COMMUNITY)
Admission: RE | Admit: 2021-11-11 | Discharge: 2021-11-11 | Disposition: A | Discharge status: Home / Self Care | Payer: Medicare Other | Source: Ambulatory Visit | Attending: Internal Medicine | Admitting: Internal Medicine

## 2021-11-11 ENCOUNTER — Encounter: Payer: Self-pay | Admitting: Internal Medicine

## 2021-11-11 VITALS — BP 108/72 | HR 92 | Temp 97.7°F | Ht 68.9 in | Wt 212.0 lb

## 2021-11-11 DIAGNOSIS — N3000 Acute cystitis without hematuria: Secondary | ICD-10-CM | POA: Diagnosis not present

## 2021-11-11 DIAGNOSIS — R7303 Prediabetes: Secondary | ICD-10-CM

## 2021-11-11 DIAGNOSIS — I10 Essential (primary) hypertension: Secondary | ICD-10-CM | POA: Diagnosis not present

## 2021-11-11 DIAGNOSIS — R369 Urethral discharge, unspecified: Secondary | ICD-10-CM | POA: Diagnosis not present

## 2021-11-11 DIAGNOSIS — Z125 Encounter for screening for malignant neoplasm of prostate: Secondary | ICD-10-CM

## 2021-11-11 DIAGNOSIS — R8290 Unspecified abnormal findings in urine: Secondary | ICD-10-CM

## 2021-11-11 NOTE — Patient Instructions (Addendum)
Use bactroban to scalp if rough spots not healing call dermatology  MD Physician   Primary Contact Information  Phone Fax E-mail Address  [phone] Not available [place] [id number]     Specialties     Dermatology        For bowel movement  -miralax 1-2 caps with 8 ounces of drink  and -colace  Warm prune juice   Constipation, Adult Constipation is when a person has fewer than three bowel movements in a week, has difficulty having a bowel movement, or has stools (feces) that are dry, hard, or larger than normal. Constipation may be caused by an underlying condition. It may become worse with age if a person takes certain medicines and does not take in enough fluids. Follow these instructions at home: Eating and drinking  Eat foods that have a lot of fiber, such as beans, whole grains, and fresh fruits and vegetables. Limit foods that are low in fiber and high in fat and processed sugars, such as fried or sweet foods. These include french fries, hamburgers, cookies, candies, and soda. Drink enough fluid to keep your urine pale yellow. General instructions Exercise regularly or as told by your health care provider. Try to do 150 minutes of moderate exercise each week. Use the bathroom when you have the urge to go. Do not hold it in. Take over-the-counter and prescription medicines only as told by your health care provider. This includes any fiber supplements. During bowel movements: Practice deep breathing while relaxing the lower abdomen. Practice pelvic floor relaxation. Watch your condition for any changes. Let your health care provider know about them. Keep all follow-up visits as told by your health care provider. This is important. Contact a health care provider if: You have pain that gets worse. You have a fever. You do not have a bowel movement after 4 days. You vomit. You are not hungry or you lose weight. You are bleeding from  the opening between the buttocks (anus). You have thin, pencil-like stools. Get help right away if: You have a fever and your symptoms suddenly get worse. You leak stool or have blood in your stool. Your abdomen is bloated. You have severe pain in your abdomen. You feel dizzy or you faint. Summary Constipation is when a person has fewer than three bowel movements in a week, has difficulty having a bowel movement, or has stools (feces) that are dry, hard, or larger than normal. Eat foods that have a lot of fiber, such as beans, whole grains, and fresh fruits and vegetables. Drink enough fluid to keep your urine pale yellow. Take over-the-counter and prescription medicines only as told by your health care provider. This includes any fiber supplements. This information is not intended to replace advice given to you by your health care provider. Make sure you discuss any questions you have with your health care provider. Document Revised: 08/28/2019 Document Reviewed: 08/28/2019 Elsevier Patient Education  [place].
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
## 2021-11-12 LAB — CBC WITH DIFFERENTIAL/PLATELET
Basophils Absolute: 0.1 10*3/uL (ref 0.0–0.1)
Basophils Relative: 1.2 % (ref 0.0–3.0)
Eosinophils Absolute: 0.1 10*3/uL (ref 0.0–0.7)
Eosinophils Relative: 0.8 % (ref 0.0–5.0)
HCT: 44.4 % (ref 39.0–52.0)
Hemoglobin: 14.8 g/dL (ref 13.0–17.0)
Lymphocytes Relative: 35 % (ref 12.0–46.0)
Lymphs Abs: 2.4 10*3/uL (ref 0.7–4.0)
MCHC: 33.3 g/dL (ref 30.0–36.0)
MCV: 85.3 fl (ref 78.0–100.0)
Monocytes Absolute: 0.8 10*3/uL (ref 0.1–1.0)
Monocytes Relative: 12.2 % — ABNORMAL HIGH (ref 3.0–12.0)
Neutro Abs: 3.4 10*3/uL (ref 1.4–7.7)
Neutrophils Relative %: 50.8 % (ref 43.0–77.0)
Platelets: 270 10*3/uL (ref 150.0–400.0)
RBC: 5.2 Mil/uL (ref 4.22–5.81)
RDW: 13.8 % (ref 11.5–15.5)
WBC: 6.7 10*3/uL (ref 4.0–10.5)

## 2021-11-12 LAB — COMPREHENSIVE METABOLIC PANEL
ALT: 16 U/L (ref 0–53)
AST: 19 U/L (ref 0–37)
Albumin: 5 g/dL (ref 3.5–5.2)
Alkaline Phosphatase: 77 U/L (ref 39–117)
BUN: 18 mg/dL (ref 6–23)
CO2: 29 mEq/L (ref 19–32)
Calcium: 10.1 mg/dL (ref 8.4–10.5)
Chloride: 95 mEq/L — ABNORMAL LOW (ref 96–112)
Creatinine, Ser: 1.19 mg/dL (ref 0.40–1.50)
GFR: 69.96 mL/min (ref >60.00)
Glucose, Bld: 91 mg/dL (ref 70–99)
Potassium: 5.2 mEq/L — ABNORMAL HIGH (ref 3.5–5.1)
Sodium: 133 mEq/L — ABNORMAL LOW (ref 135–145)
Total Bilirubin: 0.3 mg/dL (ref 0.2–1.2)
Total Protein: 8.2 g/dL (ref 6.0–8.3)

## 2021-11-12 LAB — PSA: PSA: 0.16 ng/mL (ref 0.10–4.00)

## 2021-11-12 LAB — HEMOGLOBIN A1C: Hgb A1c MFr Bld: 5.6 % (ref 4.6–6.5)

## 2021-11-13 ENCOUNTER — Encounter: Payer: Self-pay | Admitting: Internal Medicine

## 2021-11-14 LAB — URINALYSIS, ROUTINE W REFLEX MICROSCOPIC
Bilirubin Urine: NEGATIVE
Glucose, UA: NEGATIVE
Ketones, ur: NEGATIVE
Nitrite: NEGATIVE
Protein, ur: NEGATIVE
RBC / HPF: NONE SEEN /HPF (ref 0–2)
Specific Gravity, Urine: 1.012 (ref 1.001–1.035)
Squamous Epithelial / HPF: NONE SEEN /HPF (ref <=5)
WBC, UA: >=60 /HPF — AB (ref 0–5)
pH: 6 (ref 5.0–8.0)

## 2021-11-14 LAB — MICROSCOPIC MESSAGE

## 2021-11-14 LAB — URINE CULTURE
MICRO NUMBER:: 12892443
SPECIMEN QUALITY:: ADEQUATE

## 2021-11-15 ENCOUNTER — Other Ambulatory Visit: Payer: Self-pay | Admitting: Internal Medicine

## 2021-11-15 ENCOUNTER — Telehealth: Payer: Self-pay | Admitting: Internal Medicine

## 2021-11-15 ENCOUNTER — Encounter: Payer: Self-pay | Admitting: Internal Medicine

## 2021-11-15 DIAGNOSIS — N3000 Acute cystitis without hematuria: Secondary | ICD-10-CM

## 2021-11-15 LAB — URINE CYTOLOGY ANCILLARY ONLY
Bacterial Vaginitis-Urine: NEGATIVE
Candida Urine: NEGATIVE
Comment: NEGATIVE
Trichomonas: NEGATIVE

## 2021-11-15 MED ORDER — CIPROFLOXACIN HCL 500 MG PO TABS: 500.0000 mg | ORAL_TABLET | Freq: Two times a day (BID) | ORAL | 0 refills | Status: AC

## 2021-11-15 NOTE — Telephone Encounter (Signed)
Patient called in stated he would like a call about the medication that is at the Pharmacy , The patient did not know the name of medication . Please call patient @ [phone]

## 2021-11-15 NOTE — Telephone Encounter (Signed)
Notified patient of results and scheduled lab mailed low potassium food list.

## 2021-11-17 DIAGNOSIS — M47816 Spondylosis without myelopathy or radiculopathy, lumbar region: Secondary | ICD-10-CM | POA: Diagnosis not present

## 2021-11-17 DIAGNOSIS — M25511 Pain in right shoulder: Secondary | ICD-10-CM | POA: Diagnosis not present

## 2021-11-17 DIAGNOSIS — M79662 Pain in left lower leg: Secondary | ICD-10-CM | POA: Diagnosis not present

## 2021-11-17 DIAGNOSIS — G894 Chronic pain syndrome: Secondary | ICD-10-CM | POA: Diagnosis not present

## 2021-11-17 NOTE — Telephone Encounter (Signed)
[name] McLean-Scocuzza, MD  11/16/2021  2:50 PM EST Back to Top    +UTI sent cipro antibiotic  Negative trich, bacterial vaginosis or yeast    Adair Laundry, [facility]  11/16/2021 10:26 AM EST     Spoke with pt and informed him of his lab results. Pt stated that someone had already called him yesterday but there was no documentation to show that. List has been mailed and lab appt has already been scheduled.    [name] McLean-Scocuzza, MD  11/15/2021  5:29 AM EST     +UTI tx with cipro 500 mg 2x per day 7 days with food Potassium high and sodium low  -repeat bmet in 2-4 weeks order and schedule  -mail and highlight low potassium foods Blood cts normal  A1c no prediabetes Psa normal screening prostate cancer screening   [name] McLean-Scocuzza, MD  11/15/2021  5:26 AM EST     +UTI tx with cipro 500 mg 2x per day 7 days Potassium high and sodium low  -repeat bmet in 2-4 weeks order and schedule  -mail and highlight low potassium foods Blood cts normal  A1c no prediabetes Psa normal screening prostate cancer screening

## 2021-11-18 NOTE — Telephone Encounter (Signed)
Pt called in stating that he is waiting on the food list but haven't received the list yet. Pt would like to know what he can eat now. Pt requesting callback

## 2021-11-19 NOTE — Telephone Encounter (Signed)
Called Patient and he states that he got the list today.   Went over the list in detail with the Patient

## 2021-11-20 ENCOUNTER — Other Ambulatory Visit: Payer: Self-pay | Admitting: Dermatology

## 2021-11-20 DIAGNOSIS — L219 Seborrheic dermatitis, unspecified: Secondary | ICD-10-CM

## 2021-11-22 NOTE — Telephone Encounter (Signed)
Pt called in stating that he quit his diet. Pt stated that he gain 10 pounds being on that diet. Pt requesting callback.

## 2021-11-25 NOTE — Telephone Encounter (Signed)
Noted he can try low carb foods on the potassium list so not a lot of pasta, breads, sweets

## 2021-11-25 NOTE — Telephone Encounter (Signed)
I called and spoke with the patient and informed him to not eat the pasta bread and sweets on the list and he understood. Cloyce Paterson,cma

## 2021-11-25 NOTE — Telephone Encounter (Signed)
Pt called in stating that he quit his diet. Pt stated that he gain 10 pounds being on that diet. Anikin Prosser,cma

## 2021-11-26 ENCOUNTER — Other Ambulatory Visit: Payer: Self-pay | Admitting: Internal Medicine

## 2021-11-26 ENCOUNTER — Telehealth: Payer: Self-pay | Admitting: *Deleted

## 2021-11-26 DIAGNOSIS — E875 Hyperkalemia: Secondary | ICD-10-CM

## 2021-11-26 DIAGNOSIS — N3000 Acute cystitis without hematuria: Secondary | ICD-10-CM

## 2021-11-26 DIAGNOSIS — E871 Hypo-osmolality and hyponatremia: Secondary | ICD-10-CM

## 2021-11-26 NOTE — Telephone Encounter (Signed)
Please place future orders for lab appt.  

## 2021-12-02 ENCOUNTER — Other Ambulatory Visit (INDEPENDENT_AMBULATORY_CARE_PROVIDER_SITE_OTHER): Payer: Medicare Other

## 2021-12-02 ENCOUNTER — Other Ambulatory Visit: Payer: Self-pay

## 2021-12-02 DIAGNOSIS — E871 Hypo-osmolality and hyponatremia: Secondary | ICD-10-CM | POA: Diagnosis not present

## 2021-12-02 DIAGNOSIS — E875 Hyperkalemia: Secondary | ICD-10-CM

## 2021-12-02 DIAGNOSIS — N3000 Acute cystitis without hematuria: Secondary | ICD-10-CM | POA: Diagnosis not present

## 2021-12-02 LAB — BASIC METABOLIC PANEL
BUN: 26 mg/dL — ABNORMAL HIGH (ref 6–23)
CO2: 28 mEq/L (ref 19–32)
Calcium: 9.6 mg/dL (ref 8.4–10.5)
Chloride: 103 mEq/L (ref 96–112)
Creatinine, Ser: 0.97 mg/dL (ref 0.40–1.50)
GFR: 89.38 mL/min (ref >60.00)
Glucose, Bld: 105 mg/dL — ABNORMAL HIGH (ref 70–99)
Potassium: 4.3 mEq/L (ref 3.5–5.1)
Sodium: 140 mEq/L (ref 135–145)

## 2021-12-03 LAB — URINE CULTURE
MICRO NUMBER:: 12987227
Result:: NO GROWTH
SPECIMEN QUALITY:: ADEQUATE

## 2021-12-10 ENCOUNTER — Other Ambulatory Visit: Payer: Self-pay | Admitting: Internal Medicine

## 2021-12-10 DIAGNOSIS — E785 Hyperlipidemia, unspecified: Secondary | ICD-10-CM

## 2021-12-10 DIAGNOSIS — I10 Essential (primary) hypertension: Secondary | ICD-10-CM

## 2022-01-12 DIAGNOSIS — M79662 Pain in left lower leg: Secondary | ICD-10-CM | POA: Diagnosis not present

## 2022-01-12 DIAGNOSIS — M47816 Spondylosis without myelopathy or radiculopathy, lumbar region: Secondary | ICD-10-CM | POA: Diagnosis not present

## 2022-01-12 DIAGNOSIS — M25511 Pain in right shoulder: Secondary | ICD-10-CM | POA: Diagnosis not present

## 2022-01-12 DIAGNOSIS — G894 Chronic pain syndrome: Secondary | ICD-10-CM | POA: Diagnosis not present

## 2022-02-06 ENCOUNTER — Other Ambulatory Visit: Payer: Self-pay | Admitting: Internal Medicine

## 2022-02-06 DIAGNOSIS — E538 Deficiency of other specified B group vitamins: Secondary | ICD-10-CM

## 2022-02-09 ENCOUNTER — Other Ambulatory Visit: Payer: Self-pay | Admitting: Internal Medicine

## 2022-02-09 DIAGNOSIS — E538 Deficiency of other specified B group vitamins: Secondary | ICD-10-CM

## 2022-03-16 DIAGNOSIS — M79662 Pain in left lower leg: Secondary | ICD-10-CM | POA: Diagnosis not present

## 2022-03-16 DIAGNOSIS — G894 Chronic pain syndrome: Secondary | ICD-10-CM | POA: Diagnosis not present

## 2022-03-16 DIAGNOSIS — M47816 Spondylosis without myelopathy or radiculopathy, lumbar region: Secondary | ICD-10-CM | POA: Diagnosis not present

## 2022-03-16 DIAGNOSIS — Z79891 Long term (current) use of opiate analgesic: Secondary | ICD-10-CM | POA: Diagnosis not present

## 2022-03-16 DIAGNOSIS — M25511 Pain in right shoulder: Secondary | ICD-10-CM | POA: Diagnosis not present

## 2022-05-18 DIAGNOSIS — M79662 Pain in left lower leg: Secondary | ICD-10-CM | POA: Diagnosis not present

## 2022-05-18 DIAGNOSIS — G894 Chronic pain syndrome: Secondary | ICD-10-CM | POA: Diagnosis not present

## 2022-05-18 DIAGNOSIS — M25511 Pain in right shoulder: Secondary | ICD-10-CM | POA: Diagnosis not present

## 2022-05-18 DIAGNOSIS — M47816 Spondylosis without myelopathy or radiculopathy, lumbar region: Secondary | ICD-10-CM | POA: Diagnosis not present

## 2022-05-25 DIAGNOSIS — H50332 Intermittent monocular exotropia, left eye: Secondary | ICD-10-CM | POA: Diagnosis not present

## 2022-06-16 IMAGING — DX DG RIBS 2V*L*
4 series · 4 of 4 positions shown · non-contrast
Comparison: Chest radiograph dated 10/09/2017

CLINICAL DATA: Left-sided rib pain

EXAM:
LEFT RIBS - 2 VIEW

[hemithorax (ribs) ap (1 of 2)]
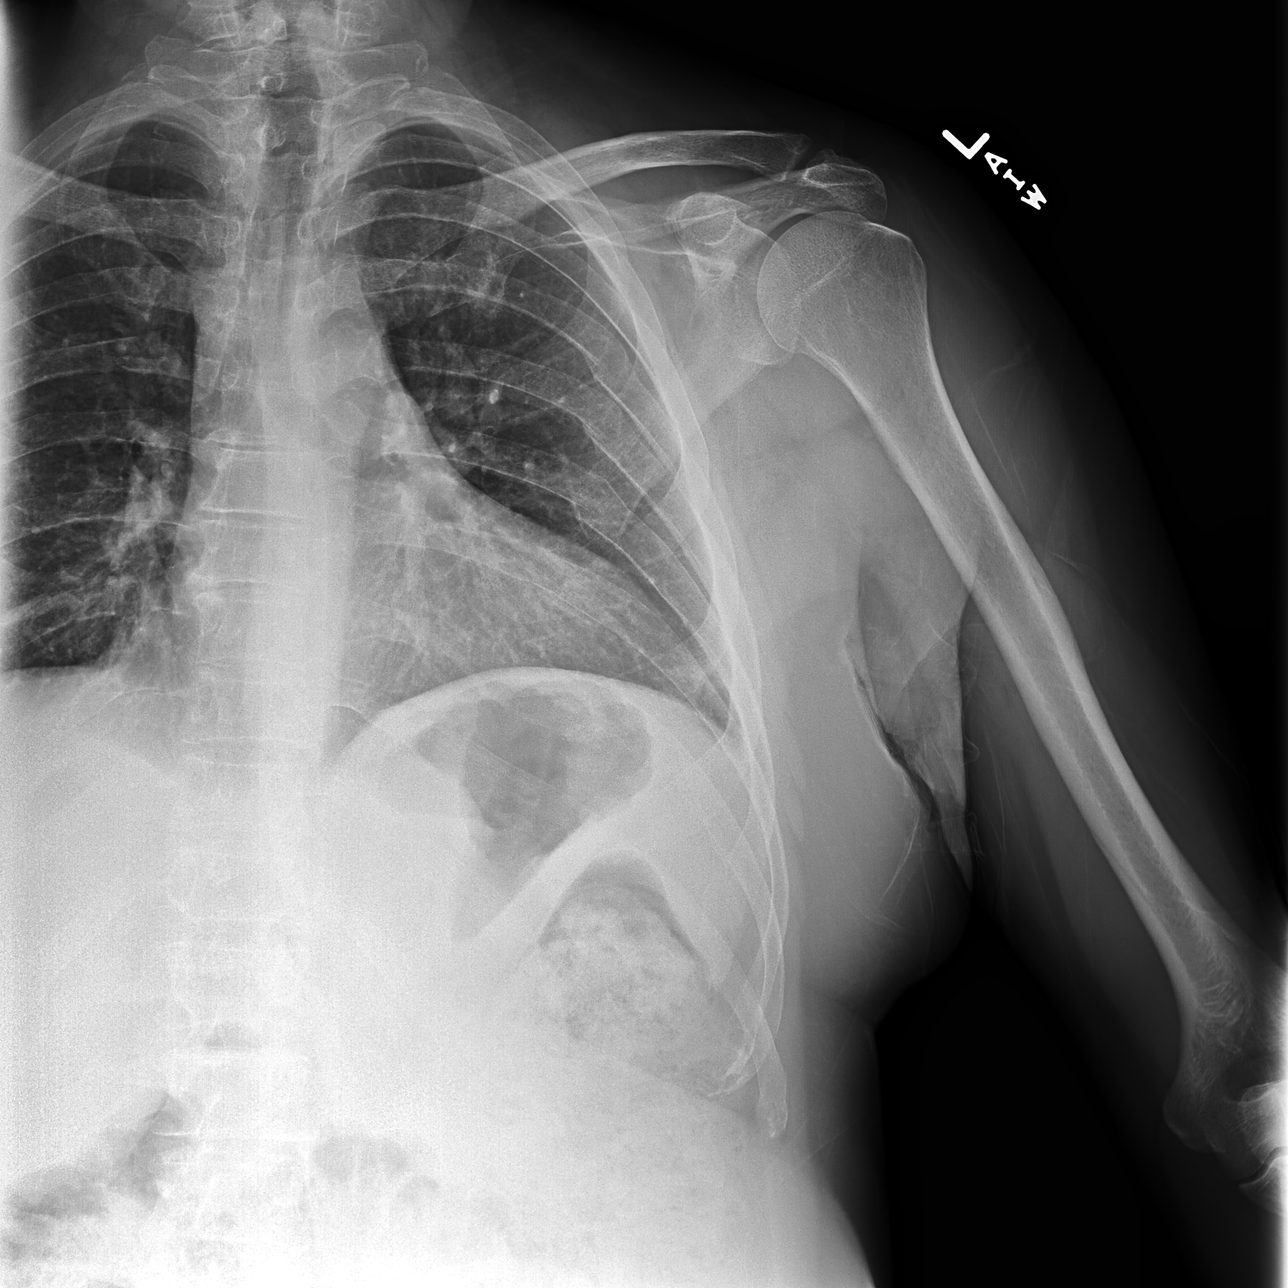

[hemithorax (ribs) ap (2 of 2)]
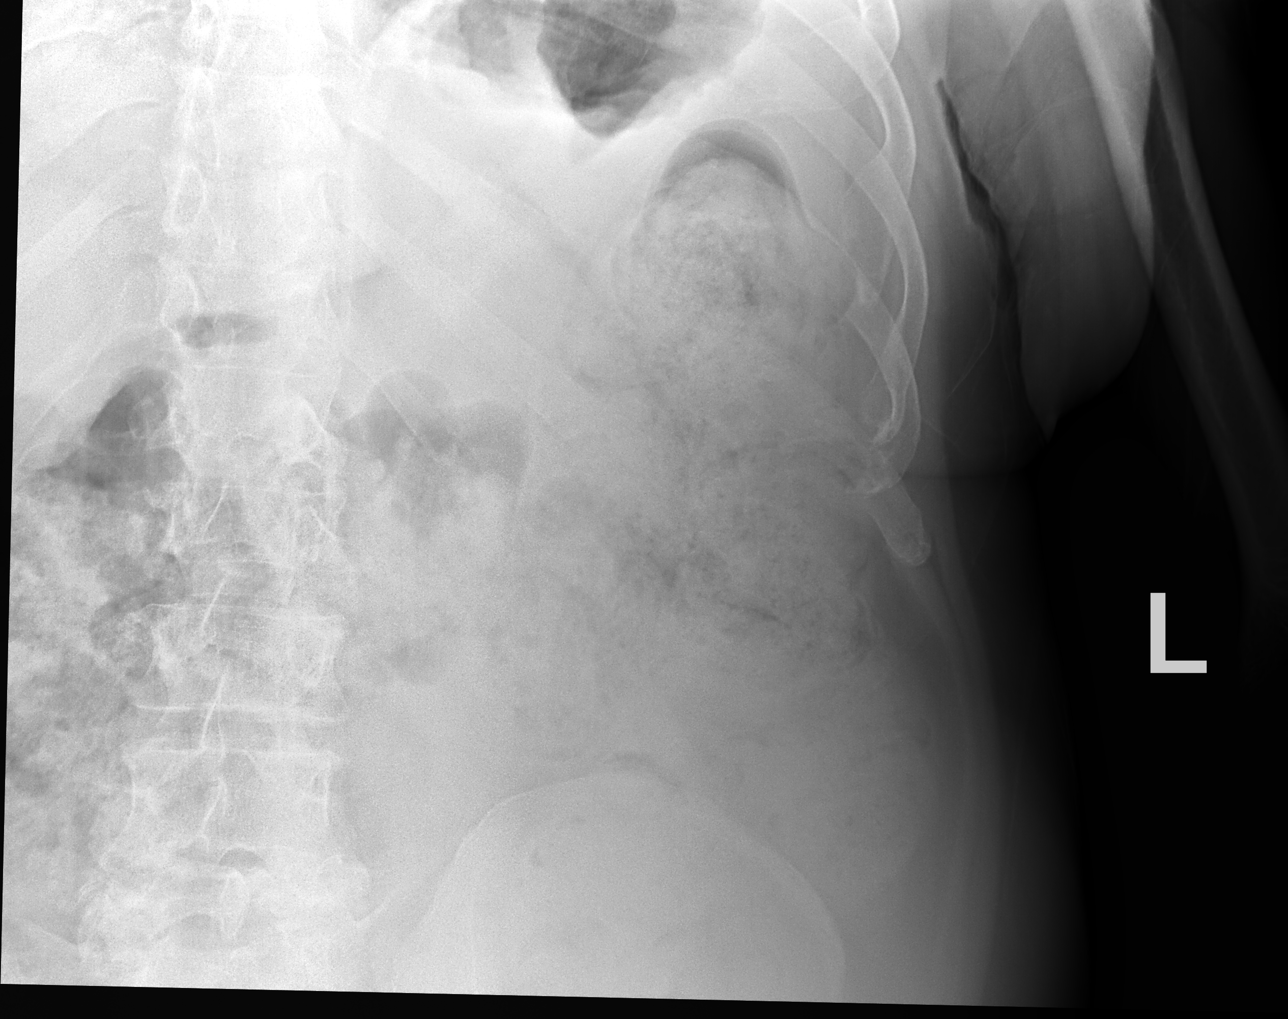

[oblique ribs obl (oblique) (1 of 2)]
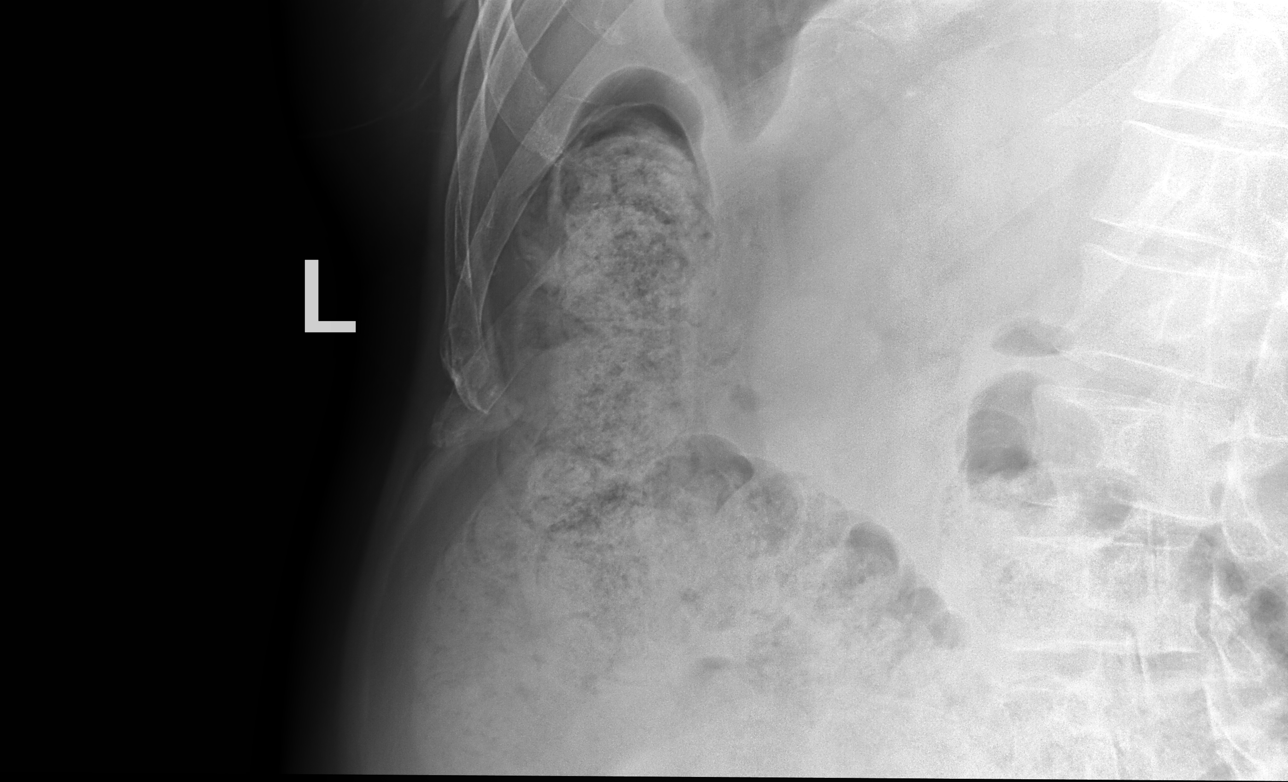

[oblique ribs obl (oblique) (2 of 2)]
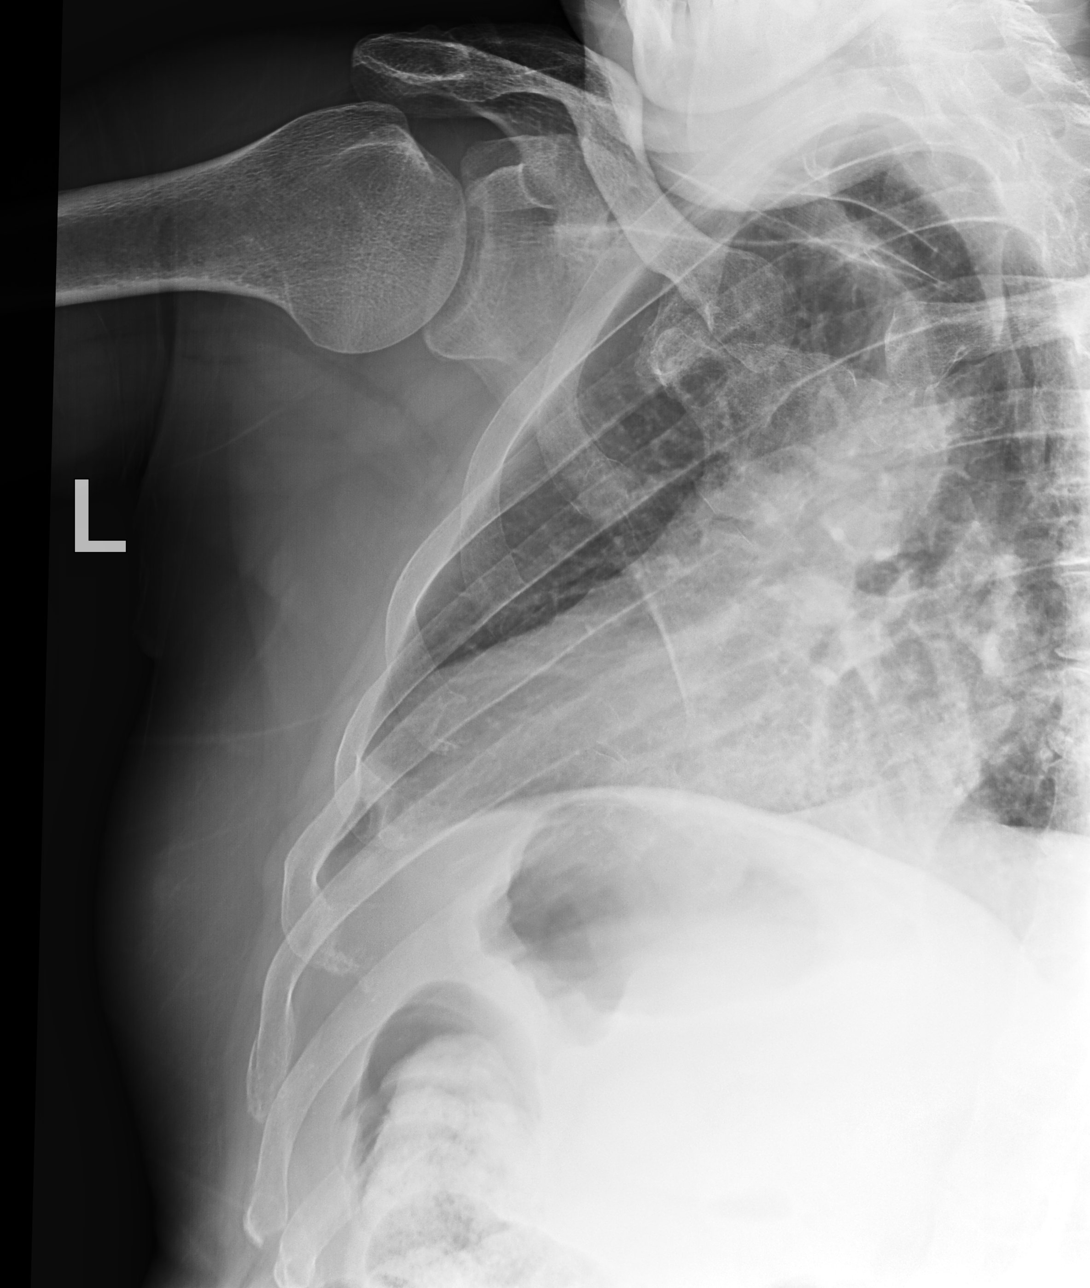

[4 of 4 positions shown; findings below may reference images not displayed]

FINDINGS: No acute fracture or other bone lesions are seen involving the ribs.
Chronic left-sided fifth through seventh rib fractures are
redemonstrated.
IMPRESSION: Negative for acute left rib fracture.

## 2022-07-20 DIAGNOSIS — M79662 Pain in left lower leg: Secondary | ICD-10-CM | POA: Diagnosis not present

## 2022-07-20 DIAGNOSIS — G894 Chronic pain syndrome: Secondary | ICD-10-CM | POA: Diagnosis not present

## 2022-07-20 DIAGNOSIS — M25511 Pain in right shoulder: Secondary | ICD-10-CM | POA: Diagnosis not present

## 2022-07-20 DIAGNOSIS — M47816 Spondylosis without myelopathy or radiculopathy, lumbar region: Secondary | ICD-10-CM | POA: Diagnosis not present

## 2022-09-14 DIAGNOSIS — G894 Chronic pain syndrome: Secondary | ICD-10-CM | POA: Diagnosis not present

## 2022-09-14 DIAGNOSIS — Z79891 Long term (current) use of opiate analgesic: Secondary | ICD-10-CM | POA: Diagnosis not present

## 2022-09-14 DIAGNOSIS — M47816 Spondylosis without myelopathy or radiculopathy, lumbar region: Secondary | ICD-10-CM | POA: Diagnosis not present

## 2022-09-14 DIAGNOSIS — M79662 Pain in left lower leg: Secondary | ICD-10-CM | POA: Diagnosis not present

## 2022-09-14 DIAGNOSIS — M25511 Pain in right shoulder: Secondary | ICD-10-CM | POA: Diagnosis not present

## 2022-09-26 ENCOUNTER — Encounter: Payer: Self-pay | Admitting: Family Medicine

## 2022-09-26 ENCOUNTER — Ambulatory Visit (INDEPENDENT_AMBULATORY_CARE_PROVIDER_SITE_OTHER): Payer: Medicare Other | Admitting: Family Medicine

## 2022-09-26 VITALS — BP 98/66 | HR 88 | Temp 97.6°F | Ht 68.9 in | Wt 220.0 lb

## 2022-09-26 DIAGNOSIS — N3000 Acute cystitis without hematuria: Secondary | ICD-10-CM

## 2022-09-26 DIAGNOSIS — N390 Urinary tract infection, site not specified: Secondary | ICD-10-CM | POA: Insufficient documentation

## 2022-09-26 DIAGNOSIS — R300 Dysuria: Secondary | ICD-10-CM | POA: Diagnosis not present

## 2022-09-26 LAB — POCT URINALYSIS DIPSTICK
Bilirubin, UA: NEGATIVE
Blood, UA: NEGATIVE
Glucose, UA: NEGATIVE
Ketones, UA: NEGATIVE
Nitrite, UA: POSITIVE
Protein, UA: POSITIVE — AB
Spec Grav, UA: 1.015 (ref 1.010–1.025)
Urobilinogen, UA: 0.2 E.U./dL
pH, UA: 6.5 (ref 5.0–8.0)

## 2022-09-26 MED ORDER — SULFAMETHOXAZOLE-TRIMETHOPRIM 800-160 MG PO TABS: 1.0000 | ORAL_TABLET | Freq: Two times a day (BID) | ORAL | 0 refills | Status: DC

## 2022-09-26 NOTE — Assessment & Plan Note (Signed)
Patient's symptoms are consistent with a UTI.  His prostate feels normal and has no indication of prostatitis.  We will proceed with urine culture and microscopy.  I will treat him with Bactrim 1 double strength tablet twice daily for 7 days.
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
## 2022-09-26 NOTE — Patient Instructions (Signed)
Nice to see you. I am going to treat you with Bactrim for a likely UTI.  We will contact you with your urine culture results and let you know if we need to change the antibiotic. If you have any worsening symptoms while on the Bactrim please let us know.

## 2022-09-27 LAB — URINALYSIS, MICROSCOPIC ONLY

## 2022-09-27 LAB — URINE CULTURE
MICRO NUMBER:: 14265686
Result:: NO GROWTH
SPECIMEN QUALITY:: ADEQUATE

## 2022-11-08 ENCOUNTER — Telehealth: Payer: Self-pay | Admitting: Internal Medicine

## 2022-11-08 NOTE — Telephone Encounter (Signed)
Pt called stating he thought that when he saw sonnenberg on 12/4 that he was going to be his doctor. I told the pt that he was only seen for a acute concern and that he did not do a TOC because the provider was not taking on patients. Pt stated that the provider told him that he was going to be his doctor

## 2022-11-09 NOTE — Telephone Encounter (Signed)
I can not recall if I told him that or not, but it is fine for him to scheduled a TOC visit with me.

## 2022-11-11 NOTE — Telephone Encounter (Signed)
I called the patient and he is scheduled for a TOC.  Ottie Tillery,cma

## 2022-11-16 DIAGNOSIS — M25511 Pain in right shoulder: Secondary | ICD-10-CM | POA: Diagnosis not present

## 2022-11-16 DIAGNOSIS — M47816 Spondylosis without myelopathy or radiculopathy, lumbar region: Secondary | ICD-10-CM | POA: Diagnosis not present

## 2022-11-16 DIAGNOSIS — M79662 Pain in left lower leg: Secondary | ICD-10-CM | POA: Diagnosis not present

## 2022-11-16 DIAGNOSIS — G894 Chronic pain syndrome: Secondary | ICD-10-CM | POA: Diagnosis not present

## 2022-11-22 ENCOUNTER — Ambulatory Visit (INDEPENDENT_AMBULATORY_CARE_PROVIDER_SITE_OTHER): Payer: Medicare Other | Admitting: Family Medicine

## 2022-11-22 ENCOUNTER — Encounter: Payer: Self-pay | Admitting: Family Medicine

## 2022-11-22 VITALS — BP 115/78 | HR 72 | Temp 97.5°F | Ht 68.0 in | Wt 211.0 lb

## 2022-11-22 DIAGNOSIS — I10 Essential (primary) hypertension: Secondary | ICD-10-CM

## 2022-11-22 DIAGNOSIS — E559 Vitamin D deficiency, unspecified: Secondary | ICD-10-CM | POA: Diagnosis not present

## 2022-11-22 DIAGNOSIS — E785 Hyperlipidemia, unspecified: Secondary | ICD-10-CM

## 2022-11-22 DIAGNOSIS — E538 Deficiency of other specified B group vitamins: Secondary | ICD-10-CM

## 2022-11-22 DIAGNOSIS — G894 Chronic pain syndrome: Secondary | ICD-10-CM

## 2022-11-22 DIAGNOSIS — F419 Anxiety disorder, unspecified: Secondary | ICD-10-CM

## 2022-11-22 DIAGNOSIS — Z23 Encounter for immunization: Secondary | ICD-10-CM | POA: Diagnosis not present

## 2022-11-22 DIAGNOSIS — R7303 Prediabetes: Secondary | ICD-10-CM | POA: Diagnosis not present

## 2022-11-22 DIAGNOSIS — F32A Depression, unspecified: Secondary | ICD-10-CM

## 2022-11-22 DIAGNOSIS — K5903 Drug induced constipation: Secondary | ICD-10-CM

## 2022-11-22 DIAGNOSIS — R011 Cardiac murmur, unspecified: Secondary | ICD-10-CM | POA: Diagnosis not present

## 2022-11-22 DIAGNOSIS — Z125 Encounter for screening for malignant neoplasm of prostate: Secondary | ICD-10-CM | POA: Diagnosis not present

## 2022-11-22 DIAGNOSIS — K5900 Constipation, unspecified: Secondary | ICD-10-CM | POA: Insufficient documentation

## 2022-11-22 LAB — COMPREHENSIVE METABOLIC PANEL
ALT: 16 U/L (ref 0–53)
AST: 17 U/L (ref 0–37)
Albumin: 4.6 g/dL (ref 3.5–5.2)
Alkaline Phosphatase: 78 U/L (ref 39–117)
BUN: 17 mg/dL (ref 6–23)
CO2: 27 mEq/L (ref 19–32)
Calcium: 9.4 mg/dL (ref 8.4–10.5)
Chloride: 102 mEq/L (ref 96–112)
Creatinine, Ser: 0.98 mg/dL (ref 0.40–1.50)
GFR: 87.68 mL/min (ref >60.00)
Glucose, Bld: 102 mg/dL — ABNORMAL HIGH (ref 70–99)
Potassium: 4.3 mEq/L (ref 3.5–5.1)
Sodium: 139 mEq/L (ref 135–145)
Total Bilirubin: 0.5 mg/dL (ref 0.2–1.2)
Total Protein: 7.3 g/dL (ref 6.0–8.3)

## 2022-11-22 LAB — VITAMIN B12: Vitamin B-12: 223 pg/mL (ref 211–911)

## 2022-11-22 LAB — LIPID PANEL
Cholesterol: 144 mg/dL (ref 0–200)
HDL: 44.1 mg/dL (ref >39.00)
LDL Cholesterol: 70 mg/dL (ref 0–99)
NonHDL: 99.61
Total CHOL/HDL Ratio: 3
Triglycerides: 146 mg/dL (ref 0.0–149.0)
VLDL: 29.2 mg/dL (ref 0.0–40.0)

## 2022-11-22 LAB — PSA: PSA: 0.12 ng/mL (ref 0.10–4.00)

## 2022-11-22 LAB — VITAMIN D 25 HYDROXY (VIT D DEFICIENCY, FRACTURES): VITD: 26.19 ng/mL — ABNORMAL LOW (ref 30.00–100.00)

## 2022-11-22 LAB — HEMOGLOBIN A1C: Hgb A1c MFr Bld: 5.4 % (ref 4.6–6.5)

## 2022-11-22 MED ORDER — LUBIPROSTONE 24 MCG PO CAPS: 24.0000 ug | ORAL_CAPSULE | Freq: Two times a day (BID) | ORAL | 3 refills | Status: DC

## 2022-11-22 NOTE — Assessment & Plan Note (Signed)
Chronic issue.  Continue simvastatin 40 mg daily.  Check labs.

## 2022-11-22 NOTE — Assessment & Plan Note (Signed)
Has had this in the past.  Recheck today.

## 2022-11-22 NOTE — Assessment & Plan Note (Signed)
Chronic issue.  Likely related to his narcotics.  We will trial Amitiza 24 mcg twice daily.  Discussed monitoring for excessive diarrhea.  If he has that he will discontinue the medication.  If it is too expensive he will let me know.

## 2022-11-22 NOTE — Patient Instructions (Signed)
Nice to see you. We will try Amitiza to see if that helps with your chronic constipation issues related to your narcotic pain medication. Will get lab work today and contact you with the results.
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
## 2022-11-22 NOTE — Assessment & Plan Note (Signed)
Chronic issue.  He will continue to follow with psychiatry.

## 2022-11-22 NOTE — Assessment & Plan Note (Addendum)
Chronic issue.  Does not seem excessively loud.  We will monitor for now.  He has seen cardiology in the distant past for this.

## 2022-11-22 NOTE — Assessment & Plan Note (Signed)
Check A1c 

## 2022-11-22 NOTE — Assessment & Plan Note (Signed)
Chronic issue.  Adequately controlled.  Patient will continue lisinopril-HCTZ 1 tablet by mouth daily.  Check labs.

## 2022-11-22 NOTE — Assessment & Plan Note (Signed)
Chronic issue.  He will continue to follow with his pain specialist.

## 2022-11-24 ENCOUNTER — Other Ambulatory Visit: Payer: Self-pay

## 2022-11-24 DIAGNOSIS — E559 Vitamin D deficiency, unspecified: Secondary | ICD-10-CM

## 2022-11-24 DIAGNOSIS — E538 Deficiency of other specified B group vitamins: Secondary | ICD-10-CM

## 2022-11-25 ENCOUNTER — Other Ambulatory Visit: Payer: Self-pay

## 2022-11-28 ENCOUNTER — Telehealth: Payer: Self-pay | Admitting: Family Medicine

## 2022-11-28 ENCOUNTER — Other Ambulatory Visit: Payer: Self-pay

## 2022-11-28 DIAGNOSIS — E538 Deficiency of other specified B group vitamins: Secondary | ICD-10-CM

## 2022-11-28 MED ORDER — CYANOCOBALAMIN 1000 MCG/ML IJ SOLN: 1000.0000 ug | INTRAMUSCULAR | 19 refills | Status: DC

## 2022-11-28 MED ORDER — "BD DISP NEEDLE 25G X 1"" MISC": 1.0000 | 4 refills | Status: DC

## 2022-11-28 NOTE — Telephone Encounter (Signed)
Prescription Request  11/28/2022  Is this a "Controlled Substance" medicine? Yes  LOV: 11/22/2022  What is the name of the medication or equipment? cyanocobalamin (,VITAMIN B-12,) 1000 MCG/ML injection and vitamin D   Have you contacted your pharmacy to request a refill? Yes   Which pharmacy would you like this sent to?  [id number] [id number]: [phone]: [phone]   Patient notified that their request is being sent to the clinical staff for review and that they should receive a response within 2 business days.   Please advise at Mobile [phone](mobile)

## 2022-11-28 NOTE — Telephone Encounter (Signed)
Spoke with the patient and informed him that the vitamin d was OVC but I did send in the needles and the B12 medication.  Venera Privott,cma

## 2022-12-31 ENCOUNTER — Other Ambulatory Visit: Payer: Self-pay | Admitting: Family Medicine

## 2022-12-31 DIAGNOSIS — K5903 Drug induced constipation: Secondary | ICD-10-CM

## 2023-01-04 ENCOUNTER — Telehealth: Payer: Self-pay

## 2023-01-04 ENCOUNTER — Telehealth: Payer: Self-pay | Admitting: Family Medicine

## 2023-01-04 ENCOUNTER — Other Ambulatory Visit: Payer: Self-pay

## 2023-01-04 DIAGNOSIS — I10 Essential (primary) hypertension: Secondary | ICD-10-CM

## 2023-01-04 MED ORDER — LISINOPRIL-HYDROCHLOROTHIAZIDE 10-12.5 MG PO TABS: ORAL_TABLET | ORAL | 3 refills | Status: DC

## 2023-01-04 NOTE — Telephone Encounter (Signed)
Refill sent to pharmacy.   

## 2023-01-04 NOTE — Telephone Encounter (Signed)
Pt need a refill on his BP medication sent to [facility]

## 2023-01-04 NOTE — Telephone Encounter (Signed)
Called Patient to let him know his Lisinopril HCTZ has been sent in

## 2023-01-18 DIAGNOSIS — Z79891 Long term (current) use of opiate analgesic: Secondary | ICD-10-CM | POA: Diagnosis not present

## 2023-01-18 DIAGNOSIS — M47816 Spondylosis without myelopathy or radiculopathy, lumbar region: Secondary | ICD-10-CM | POA: Diagnosis not present

## 2023-01-18 DIAGNOSIS — M25511 Pain in right shoulder: Secondary | ICD-10-CM | POA: Diagnosis not present

## 2023-01-18 DIAGNOSIS — M79662 Pain in left lower leg: Secondary | ICD-10-CM | POA: Diagnosis not present

## 2023-01-18 DIAGNOSIS — G894 Chronic pain syndrome: Secondary | ICD-10-CM | POA: Diagnosis not present

## 2023-02-14 ENCOUNTER — Telehealth: Payer: Self-pay | Admitting: Family Medicine

## 2023-02-14 NOTE — Telephone Encounter (Signed)
Copied from CRM [id number]. Topic: Medicare AWV >> Feb 14, 2023 10:55 AM Rushie Goltz wrote: Reason for CRM: Called patient to schedule Medicare Annual Wellness Visit (AWV). Left message for patient to call back and schedule Medicare Annual Wellness Visit (AWV).  Last date of AWV: 08/07/2020  Please schedule an AWVS appointment at any time with [facility] VISIT.  If any questions, please contact me at [phone].    Thank you,  [facility] Support [facility] Direct dial  [phone]

## 2023-02-14 NOTE — Telephone Encounter (Signed)
Contacted Yolande Jolly to schedule their annual wellness visit. Appointment made for 02/20/2023.  Thank you,  [facility] Support [facility] Direct dial  [phone]

## 2023-02-20 ENCOUNTER — Ambulatory Visit (INDEPENDENT_AMBULATORY_CARE_PROVIDER_SITE_OTHER): Payer: Medicare Other

## 2023-02-20 VITALS — Ht 68.0 in | Wt 211.0 lb

## 2023-02-20 DIAGNOSIS — Z0000 Encounter for general adult medical examination without abnormal findings: Secondary | ICD-10-CM

## 2023-02-20 NOTE — Patient Instructions (Addendum)
William Chase , Thank you for taking time to come for your Medicare Wellness Visit. I appreciate your ongoing commitment to your health goals. Please review the following plan we discussed and let me know if I can assist you in the future.   These are the goals we discussed:  Goals       Patient Stated     Follow up with Primary Care Provider (pt-stated)      As needed.        This is a list of the screening recommended for you and due dates:  Health Maintenance  Topic Date Due   COVID-19 Vaccine (4 - 2023-24 season) 03/08/2023*   Flu Shot  05/25/2023   Medicare Annual Wellness Visit  02/20/2024   DTaP/Tdap/Td vaccine (4 - Td or Tdap) 06/04/2031   Colon Cancer Screening  10/07/2031   Hepatitis C Screening: USPSTF Recommendation to screen - Ages 18-79 yo.  Completed   HIV Screening  Completed   Zoster (Shingles) Vaccine  Completed   HPV Vaccine  Aged Out  *Topic was postponed. The date shown is not the original due date.   Conditions/risks identified: none new  Next appointment: Follow up in one year for your annual wellness visit   Preventive Care 40-64 Years, Male Preventive care refers to lifestyle choices and visits with your health care provider that can promote health and wellness. What does preventive care include? A yearly physical exam. This is also called an annual well check. Dental exams once or twice a year. Routine eye exams. Ask your health care provider how often you should have your eyes checked. Personal lifestyle choices, including: Daily care of your teeth and gums. Regular physical activity. Eating a healthy diet. Avoiding tobacco and drug use. Limiting alcohol use. Practicing safe sex. Taking low-dose aspirin every day starting at age 49. What happens during an annual well check? The services and screenings done by your health care provider during your annual well check will depend on your age, overall health, lifestyle risk factors, and family  history of disease. Counseling  Your health care provider may ask you questions about your: Alcohol use. Tobacco use. Drug use. Emotional well-being. Home and relationship well-being. Sexual activity. Eating habits. Work and work Astronomer. Screening  You may have the following tests or measurements: Height, weight, and BMI. Blood pressure. Lipid and cholesterol levels. These may be checked every 5 years, or more frequently if you are over 50 years old. Skin check. Lung cancer screening. You may have this screening every year starting at age 5 if you have a 30-pack-year history of smoking and currently smoke or have quit within the past 15 years. Fecal occult blood test (FOBT) of the stool. You may have this test every year starting at age 38. Flexible sigmoidoscopy or colonoscopy. You may have a sigmoidoscopy every 5 years or a colonoscopy every 10 years starting at age 15. Prostate cancer screening. Recommendations will vary depending on your family history and other risks. Hepatitis C blood test. Hepatitis B blood test. Sexually transmitted disease (STD) testing. Diabetes screening. This is done by checking your blood sugar (glucose) after you have not eaten for a while (fasting). You may have this done every 1-3 years. Discuss your test results, treatment options, and if necessary, the need for more tests with your health care provider. Vaccines  Your health care provider may recommend certain vaccines, such as: Influenza vaccine. This is recommended every year. Tetanus, diphtheria, and acellular pertussis (Tdap, Td)  vaccine. You may need a Td booster every 10 years. Zoster vaccine. You may need this after age 70. Pneumococcal 13-valent conjugate (PCV13) vaccine. You may need this if you have certain conditions and have not been vaccinated. Pneumococcal polysaccharide (PPSV23) vaccine. You may need one or two doses if you smoke cigarettes or if you have certain  conditions. Talk to your health care provider about which screenings and vaccines you need and how often you need them. This information is not intended to replace advice given to you by your health care provider. Make sure you discuss any questions you have with your health care provider. Document Released: 11/06/2015 Document Revised: 06/29/2016 Document Reviewed: 08/11/2015 Elsevier Interactive Patient Education  2017 [employer].  Fall Prevention in the Home Falls can cause injuries. They can happen to people of all ages. There are many things you can do to make your home safe and to help prevent falls. What can I do on the outside of my home? Regularly fix the edges of walkways and driveways and fix any cracks. Remove anything that might make you trip as you walk through a door, such as a raised step or threshold. Trim any bushes or trees on the path to your home. Use bright outdoor lighting. Clear any walking paths of anything that might make someone trip, such as rocks or tools. Regularly check to see if handrails are loose or broken. Make sure that both sides of any steps have handrails. Any raised decks and porches should have guardrails on the edges. Have any leaves, snow, or ice cleared regularly. Use sand or salt on walking paths during winter. Clean up any spills in your garage right away. This includes oil or grease spills. What can I do in the bathroom? Use night lights. Install grab bars by the toilet and in the tub and shower. Do not use towel bars as grab bars. Use non-skid mats or decals in the tub or shower. If you need to sit down in the shower, use a plastic, non-slip stool. Keep the floor dry. Clean up any water that spills on the floor as soon as it happens. Remove soap buildup in the tub or shower regularly. Attach bath mats securely with double-sided non-slip rug tape. Do not have throw rugs and other things on the floor that can make you trip. What can I do in  the bedroom? Use night lights. Make sure that you have a light by your bed that is easy to reach. Do not use any sheets or blankets that are too big for your bed. They should not hang down onto the floor. Have a firm chair that has side arms. You can use this for support while you get dressed. Do not have throw rugs and other things on the floor that can make you trip. What can I do in the kitchen? Clean up any spills right away. Avoid walking on wet floors. Keep items that you use a lot in easy-to-reach places. If you need to reach something above you, use a strong step stool that has a grab bar. Keep electrical cords out of the way. Do not use floor polish or wax that makes floors slippery. If you must use wax, use non-skid floor wax. Do not have throw rugs and other things on the floor that can make you trip. What can I do with my stairs? Do not leave any items on the stairs. Make sure that there are handrails on both sides of the stairs  and use them. Fix handrails that are broken or loose. Make sure that handrails are as long as the stairways. Check any carpeting to make sure that it is firmly attached to the stairs. Fix any carpet that is loose or worn. Avoid having throw rugs at the top or bottom of the stairs. If you do have throw rugs, attach them to the floor with carpet tape. Make sure that you have a light switch at the top of the stairs and the bottom of the stairs. If you do not have them, ask someone to add them for you. What else can I do to help prevent falls? Wear shoes that: Do not have high heels. Have rubber bottoms. Are comfortable and fit you well. Are closed at the toe. Do not wear sandals. If you use a stepladder: Make sure that it is fully opened. Do not climb a closed stepladder. Make sure that both sides of the stepladder are locked into place. Ask someone to hold it for you, if possible. Clearly mark and make sure that you can see: Any grab bars or  handrails. First and last steps. Where the edge of each step is. Use tools that help you move around (mobility aids) if they are needed. These include: Canes. Walkers. Scooters. Crutches. Turn on the lights when you go into a dark area. Replace any light bulbs as soon as they burn out. Set up your furniture so you have a clear path. Avoid moving your furniture around. If any of your floors are uneven, fix them. If there are any pets around you, be aware of where they are. Review your medicines with your doctor. Some medicines can make you feel dizzy. This can increase your chance of falling. Ask your doctor what other things that you can do to help prevent falls. This information is not intended to replace advice given to you by your health care provider. Make sure you discuss any questions you have with your health care provider. Document Released: 08/06/2009 Document Revised: 03/17/2016 Document Reviewed: 11/14/2014 Elsevier Interactive Patient Education  2017 [employer].
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
## 2023-02-22 ENCOUNTER — Other Ambulatory Visit (INDEPENDENT_AMBULATORY_CARE_PROVIDER_SITE_OTHER): Payer: Medicare Other

## 2023-02-22 DIAGNOSIS — E559 Vitamin D deficiency, unspecified: Secondary | ICD-10-CM

## 2023-02-22 DIAGNOSIS — E538 Deficiency of other specified B group vitamins: Secondary | ICD-10-CM

## 2023-02-22 LAB — VITAMIN D 25 HYDROXY (VIT D DEFICIENCY, FRACTURES): VITD: 31.82 ng/mL (ref 30.00–100.00)

## 2023-02-22 LAB — VITAMIN B12: Vitamin B-12: 303 pg/mL (ref 211–911)

## 2023-02-24 ENCOUNTER — Other Ambulatory Visit: Payer: Self-pay | Admitting: Family Medicine

## 2023-02-24 ENCOUNTER — Other Ambulatory Visit: Payer: Self-pay

## 2023-02-24 DIAGNOSIS — E559 Vitamin D deficiency, unspecified: Secondary | ICD-10-CM

## 2023-02-24 DIAGNOSIS — E538 Deficiency of other specified B group vitamins: Secondary | ICD-10-CM

## 2023-02-24 MED ORDER — CYANOCOBALAMIN 1000 MCG/ML IJ SOLN
1000.0000 ug | INTRAMUSCULAR | 19 refills | Status: DC
Start: 2023-02-24 — End: 2024-06-26

## 2023-04-14 ENCOUNTER — Encounter: Payer: Self-pay | Admitting: Nurse Practitioner

## 2023-04-14 ENCOUNTER — Ambulatory Visit (INDEPENDENT_AMBULATORY_CARE_PROVIDER_SITE_OTHER): Payer: Medicare Other | Admitting: Nurse Practitioner

## 2023-04-14 VITALS — BP 124/80 | HR 83 | Temp 97.5°F | Resp 17 | Ht 68.0 in | Wt 193.0 lb

## 2023-04-14 DIAGNOSIS — L02419 Cutaneous abscess of limb, unspecified: Secondary | ICD-10-CM

## 2023-04-14 MED ORDER — CEPHALEXIN 500 MG PO CAPS: 500.0000 mg | ORAL_CAPSULE | Freq: Two times a day (BID) | ORAL | 0 refills | Status: AC

## 2023-04-14 NOTE — Patient Instructions (Addendum)
Cephalexin sent to the pharmacy. Use warm compress 2-3 times a day. Follow up in 1 week in symptoms not improving.
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
## 2023-04-17 DIAGNOSIS — L02419 Cutaneous abscess of limb, unspecified: Secondary | ICD-10-CM | POA: Insufficient documentation

## 2023-04-17 NOTE — Assessment & Plan Note (Signed)
Erythematous, swollen and tender to touch abscess to left armpit. Will treat with cephalexin 500 mg twice a day for 10 days. Advised to use warm compress. If symptoms not improving call the office for evaluation.

## 2023-04-18 ENCOUNTER — Other Ambulatory Visit: Payer: Self-pay | Admitting: Family Medicine

## 2023-04-18 DIAGNOSIS — E785 Hyperlipidemia, unspecified: Secondary | ICD-10-CM

## 2023-05-23 ENCOUNTER — Telehealth: Payer: Self-pay

## 2023-05-23 ENCOUNTER — Ambulatory Visit: Payer: Medicare Other | Admitting: Family Medicine

## 2023-05-23 NOTE — Telephone Encounter (Signed)
Patient states he is peeing blood and his lower back is hurting.  Patient states it has been burning when he pees for about a week or two, but the blood just started a few minutes ago.  I transferred call to Access Nurse.

## 2023-05-23 NOTE — Telephone Encounter (Signed)
    Patient states these symptoms are correct and the time lines are correct but now he has the chills. Patient states ED/UC is too Expensive. Please advise.

## 2023-05-23 NOTE — Telephone Encounter (Signed)
Please call and confirm - plans to be seen.  Agree if increased back pain, blood in urine, etc - agree with need for evaluation.

## 2023-05-23 NOTE — Telephone Encounter (Signed)
Pt called back again stating that he would like a referral for a MRI. Pt stated he's not going to the ED or UC because its too expensive. Pt would like a call back from a nurse.

## 2023-05-23 NOTE — Telephone Encounter (Signed)
Pt refuses to be seen today at urgent care, ED or [place] office.  Reports that he has had burning with urination x 3 weeks.  Noticed frank blood in urine x 1 today and chills but denies fever.  Refuses to go to ED. Pt requested an appointment in [facility] tomorrow.  Appointment made with Dr. Clent Ridges @ 11am 05/24/23.

## 2023-05-24 ENCOUNTER — Ambulatory Visit (INDEPENDENT_AMBULATORY_CARE_PROVIDER_SITE_OTHER): Payer: Medicare Other | Admitting: Family Medicine

## 2023-05-24 ENCOUNTER — Ambulatory Visit: Payer: Medicare Other | Admitting: Family Medicine

## 2023-05-24 ENCOUNTER — Encounter: Payer: Self-pay | Admitting: Family Medicine

## 2023-05-24 VITALS — BP 90/50 | HR 90 | Temp 98.7°F | Ht 68.0 in | Wt 189.6 lb

## 2023-05-24 DIAGNOSIS — N390 Urinary tract infection, site not specified: Secondary | ICD-10-CM | POA: Diagnosis not present

## 2023-05-24 DIAGNOSIS — R300 Dysuria: Secondary | ICD-10-CM

## 2023-05-24 DIAGNOSIS — R319 Hematuria, unspecified: Secondary | ICD-10-CM

## 2023-05-24 LAB — POC URINALSYSI DIPSTICK (AUTOMATED)
Bilirubin, UA: NEGATIVE
Glucose, UA: NEGATIVE
Ketones, UA: NEGATIVE
Nitrite, UA: POSITIVE
Protein, UA: POSITIVE — AB
Spec Grav, UA: 1.015 (ref 1.010–1.025)
Urobilinogen, UA: 0.2 E.U./dL
pH, UA: 6.5 (ref 5.0–8.0)

## 2023-05-24 MED ORDER — CIPROFLOXACIN HCL 500 MG PO TABS: 500.0000 mg | ORAL_TABLET | Freq: Two times a day (BID) | ORAL | 0 refills | Status: AC

## 2023-05-24 NOTE — Progress Notes (Signed)
   Subjective:    Patient ID: William Chase, male    DOB: 08-23-1966, 57 y.o.   MRN: [id number]  HPI Here for one week of low back pain and urgency to urinate. Then this morning he began to pass blood in the urine and it burned as it came out. No fever. Of note he was seen for an axillary boil a few weeks ago and this was treated with Keflex.    Review of Systems  Constitutional: Negative.   Respiratory: Negative.    Cardiovascular: Negative.   Gastrointestinal: Negative.   Genitourinary:  Positive for dysuria, flank pain, hematuria and urgency.       Objective:   Physical Exam Constitutional:      Appearance: Normal appearance. He is not ill-appearing.  Cardiovascular:     Rate and Rhythm: Normal rate and regular rhythm.     Pulses: Normal pulses.     Heart sounds: Normal heart sounds.  Pulmonary:     Effort: Pulmonary effort is normal.     Breath sounds: Normal breath sounds.  Abdominal:     General: Abdomen is flat. Bowel sounds are normal. There is no distension.     Palpations: Abdomen is soft. There is no mass.     Tenderness: There is no abdominal tenderness. There is no right CVA tenderness, left CVA tenderness, guarding or rebound.     Hernia: No hernia is present.  Neurological:     Mental Status: He is alert.           Assessment & Plan:  UTI, treat with 10 days of Cipro. Culture the sample. He will drink lots of water.  Gershon Crane, MD

## 2023-05-24 NOTE — Telephone Encounter (Signed)
Noted  

## 2023-05-26 ENCOUNTER — Other Ambulatory Visit: Payer: Medicare Other

## 2023-06-15 ENCOUNTER — Telehealth: Payer: Self-pay | Admitting: Family Medicine

## 2023-06-15 NOTE — Telephone Encounter (Signed)
See other phone note

## 2023-06-15 NOTE — Telephone Encounter (Signed)
Called Patient to schedule him and he said it is the same thing as before then when I said Dr. Birdie Sons states you need to be evaluated the Patient says make me an appointment. I go to book it to schedule him and ask his availability and he says just make it and put it on mychart cause I have the bank on the other line. I say well what day/ time is good for you he says I do not know and I have the bank on the other line and told me he would call back and schedule something. Patient then hung up.

## 2023-06-15 NOTE — Telephone Encounter (Signed)
Patient just called and said he has 2 knots under his armpits. He said he was prescribed some antibiotics before and it went away. He was hoping could he get some more. His pharmacy he uses is [phone] [place], [place] - [address] AT [facility] [address] [place] [address] Phone: [phone]  Fax: [phone]  His number is [phone]

## 2023-06-16 NOTE — Telephone Encounter (Signed)
Called and let  Patient know

## 2023-06-16 NOTE — Telephone Encounter (Signed)
Noted. Please reach back out to the patient and offer him an appointment. He needs to be seen in person to determine if the lesions need to be lanced or if he just needs an antibiotic.

## 2023-06-16 NOTE — Telephone Encounter (Signed)
Patient states he is scheduled with someone on 06/20/23 at 1:00 but he knows he just needs an antibiotic. Patient is scheduled with Kara Dies.

## 2023-06-16 NOTE — Telephone Encounter (Signed)
Noted.  He needs to keep the appointment though if anything worsens he needs to be seen sooner at urgent care or the [facility] walk-in clinic.

## 2023-06-20 ENCOUNTER — Ambulatory Visit: Payer: Medicare Other | Admitting: Nurse Practitioner

## 2023-06-20 ENCOUNTER — Encounter: Payer: Self-pay | Admitting: Nurse Practitioner

## 2023-06-20 VITALS — BP 132/84 | HR 91 | Temp 97.7°F | Ht 68.0 in | Wt 193.8 lb

## 2023-06-20 DIAGNOSIS — L02419 Cutaneous abscess of limb, unspecified: Secondary | ICD-10-CM

## 2023-06-20 MED ORDER — MUPIROCIN 2 % EX OINT: 1.0000 | TOPICAL_OINTMENT | Freq: Two times a day (BID) | CUTANEOUS | 0 refills | Status: DC

## 2023-06-20 MED ORDER — SULFAMETHOXAZOLE-TRIMETHOPRIM 800-160 MG PO TABS: 1.0000 | ORAL_TABLET | Freq: Two times a day (BID) | ORAL | 0 refills | Status: DC
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
## 2023-06-20 NOTE — Patient Instructions (Addendum)
Rx sent to pharmacy.  If symptoms not improving let us know.  Skin Abscess  A skin abscess is an infected spot of skin. It can have pus in it. An abscess can happen in any part of your body. Some abscesses break open (rupture) on their own. Most keep getting worse unless they are treated. If your abscess is not treated, the infection can spread deeper into your body and blood. This can make you feel sick. What are the causes? Germs that enter your skin. This may happen if you have: A cut or scrape. A wound from a needle or an insect bite. Blocked oil or sweat glands. A problem with the spot where your hair goes into your skin. A fluid-filled sac called a cyst under your skin. What increases the risk? Having problems with how your blood moves through your body. Having a weak body defense system (immune system). Having diabetes. Having dry and irritated skin. Needing to get shots often. Putting drugs into your body with a needle. Having a splinter or something else in your skin. Smoking. What are the signs or symptoms? A firm bump under your skin that hurts. A bump with pus at the top. Redness and swelling. Warm or tender spots. A sore on the skin. How is this treated? You may need to: Put a heat pack or a warm, wet washcloth on the spot. Have the pus drained. Take antibiotics. Follow these instructions at home: Medicines Take over-the-counter and prescription medicines only as told by your doctor. If you were prescribed antibiotics, take them as told by your doctor. Do not stop taking them even if you start to feel better. Abscess care  If you have an abscess that has not drained, put heat on it. Use the heat source that your doctor recommends, such as a moist heat pack or a heating pad. Place a towel between your skin and the heat source. Leave the heat on for 20-30 minutes. If your skin turns bright red, take off the heat right away to prevent burns. The risk of burns is  higher if you cannot feel pain, heat, or cold. Follow instructions from your doctor about how to take care of your abscess. Make sure you: Cover the abscess with a bandage. Wash your hands with soap and water for at least 20 seconds before and after you change your bandage. If you cannot use soap and water, use hand sanitizer. Change your bandage as told by your doctor. Check your abscess every day for signs that the infection is getting worse. Check for: More redness, swelling, or pain. More fluid or blood. Warmth. More pus or a worse smell. General instructions To keep the infection from spreading: Do not share personal items or towels. Do not go in a hot tub with others. Avoid making skin contact with others. Be careful when you get rid of used bandages or any pus from the abscess. Do not smoke or use any products that contain nicotine or tobacco. If you need help quitting, ask your doctor. Contact a doctor if: You see red streaks on your skin near the abscess. You have any signs of worse infection. You vomit every time you eat or drink. You have a fever, chills, or muscle aches. The cyst or abscess comes back. Get help right away if: You have very bad pain. You make less pee (urine) than normal. This information is not intended to replace advice given to you by your health care provider. Make sure you  discuss any questions you have with your health care provider. Document Revised: 05/25/2022 Document Reviewed: 05/25/2022 Elsevier Patient Education  2024 [employer].

## 2023-06-27 DIAGNOSIS — L0292 Furuncle, unspecified: Secondary | ICD-10-CM | POA: Insufficient documentation

## 2023-06-27 NOTE — Assessment & Plan Note (Signed)
Erythematous, swollen and tender to touch boil bilaterally, no discharge at present. Will treat with Bactrim for 10 days and Bactroban ointment. Advised to use warm compress. Will refer to dermatology if pt have recurrent episode of boil. If symptoms not improving call the office for evaluation.

## 2023-09-13 ENCOUNTER — Encounter: Payer: Self-pay | Admitting: Family Medicine

## 2023-09-13 ENCOUNTER — Ambulatory Visit
Admission: RE | Admit: 2023-09-13 | Discharge: 2023-09-13 | Disposition: A | Discharge status: Home / Self Care | Payer: Medicare Other | Source: Ambulatory Visit | Attending: Family Medicine | Admitting: Family Medicine

## 2023-09-13 ENCOUNTER — Ambulatory Visit
Admission: RE | Admit: 2023-09-13 | Discharge: 2023-09-13 | Disposition: A | Discharge status: Home / Self Care | Payer: Medicare Other | Attending: Family Medicine | Admitting: Family Medicine

## 2023-09-13 ENCOUNTER — Ambulatory Visit (INDEPENDENT_AMBULATORY_CARE_PROVIDER_SITE_OTHER): Payer: Medicare Other | Admitting: Family Medicine

## 2023-09-13 VITALS — BP 122/76 | HR 80 | Temp 97.8°F | Ht 68.0 in | Wt 195.0 lb

## 2023-09-13 DIAGNOSIS — G8929 Other chronic pain: Secondary | ICD-10-CM

## 2023-09-13 DIAGNOSIS — E559 Vitamin D deficiency, unspecified: Secondary | ICD-10-CM | POA: Diagnosis not present

## 2023-09-13 DIAGNOSIS — M25562 Pain in left knee: Secondary | ICD-10-CM

## 2023-09-13 DIAGNOSIS — M25511 Pain in right shoulder: Secondary | ICD-10-CM

## 2023-09-13 DIAGNOSIS — L219 Seborrheic dermatitis, unspecified: Secondary | ICD-10-CM | POA: Diagnosis not present

## 2023-09-13 DIAGNOSIS — R29898 Other symptoms and signs involving the musculoskeletal system: Secondary | ICD-10-CM

## 2023-09-13 DIAGNOSIS — F419 Anxiety disorder, unspecified: Secondary | ICD-10-CM

## 2023-09-13 DIAGNOSIS — E538 Deficiency of other specified B group vitamins: Secondary | ICD-10-CM

## 2023-09-13 DIAGNOSIS — F32A Depression, unspecified: Secondary | ICD-10-CM

## 2023-09-13 HISTORY — DX: Other symptoms and signs involving the musculoskeletal system: R29.898

## 2023-09-13 LAB — VITAMIN B12: Vitamin B-12: 291 pg/mL (ref 211–911)

## 2023-09-13 LAB — BASIC METABOLIC PANEL
BUN: 16 mg/dL (ref 6–23)
CO2: 28 meq/L (ref 19–32)
Calcium: 9.7 mg/dL (ref 8.4–10.5)
Chloride: 103 meq/L (ref 96–112)
Creatinine, Ser: 1 mg/dL (ref 0.40–1.50)
GFR: 85.1 mL/min (ref >60.00)
Glucose, Bld: 102 mg/dL — ABNORMAL HIGH (ref 70–99)
Potassium: 4.4 meq/L (ref 3.5–5.1)
Sodium: 138 meq/L (ref 135–145)

## 2023-09-13 LAB — VITAMIN D 25 HYDROXY (VIT D DEFICIENCY, FRACTURES): VITD: 33.12 ng/mL (ref 30.00–100.00)

## 2023-09-13 MED ORDER — CYCLOBENZAPRINE HCL 10 MG PO TABS: 10.0000 mg | ORAL_TABLET | Freq: Three times a day (TID) | ORAL | 0 refills | Status: DC | PRN

## 2023-09-13 MED ORDER — KETOCONAZOLE 2 % EX CREA
TOPICAL_CREAM | CUTANEOUS | 0 refills | Status: DC
Start: 2023-09-13 — End: 2023-12-01

## 2023-09-13 MED ORDER — MELOXICAM 15 MG PO TABS: 15.0000 mg | ORAL_TABLET | Freq: Every day | ORAL | 0 refills | Status: DC

## 2023-09-13 NOTE — Assessment & Plan Note (Signed)
Chronic issue.  Check x-ray today.  Refer to orthopedics.

## 2023-09-13 NOTE — Assessment & Plan Note (Signed)
Chronic issue.  Recheck today.  Continue vitamin D 4000 international units daily.

## 2023-09-13 NOTE — Assessment & Plan Note (Signed)
Chronic issue.  Patient will continue to see psychiatry for this.

## 2023-09-13 NOTE — Progress Notes (Signed)
Marikay Alar, MD Phone: [phone]  William Chase is a 57 y.o. male who presents today for follow-up.  Right shoulder pain: Patient notes this has been going on a number of months now.  It is progressively getting worse.  He fell a couple times in the last 6 months and notes he landed on his right shoulder.  He has a history of rotator cuff surgery and a collarbone fracture in that shoulder.  Notes he cannot pick up much due to the pain in his right shoulder.  Does have a history of pinched nerve that he reports causes symptoms down into his shoulder and upper arm.  Left knee pain: Patient notes this has been going on for several months as well.  Hurts if he is walking too much.  Also hurts when he sits.  Notes it pops.  Does not give out on him.  He has not been taking anything for this.  Anxiety/depression: This is a chronic issue and is somewhat worse given life circumstances.  He saw psychiatry on Monday and they added trazodone.  They are working on medication optimization.  No SI.  Right leg weakness: Patient reports a number of months ago he woke up and tried get out of bed.  He notes his right leg did not work and he fell to the ground.  He notes he got to his recliner and sat down and when he got up his right leg was fine.  Has not had any recurrent symptoms.   Social History   Tobacco Use  Smoking Status Never  Smokeless Tobacco Former    Current Outpatient Medications on File Prior to Visit  Medication Sig Dispense Refill   BIOTIN PO Take by mouth.     buPROPion (WELLBUTRIN XL) 150 MG 24 hr tablet Take 450 mg by mouth every morning.     cyanocobalamin (VITAMIN B12) 1000 MCG/ML injection Inject 1 mL (1,000 mcg total) into the muscle every 14 (fourteen) days. 3 mL 19   diazepam (VALIUM) 10 MG tablet Take 10 mg by mouth daily as needed.     hydrocortisone 2.5 % lotion Apply topically 3 (three) times a week. Apply to face 3 nights a week Monday, Wednesday, and friday 59 mL  3   lisinopril-hydrochlorothiazide (ZESTORETIC) 10-12.5 MG tablet TAKE 1 TABLET BY MOUTH DAILY. MUST KEEP JUNE APPT W/NEW PROVIDER FOR FUTURE REFILLS 90 tablet 3   lubiprostone (AMITIZA) 24 MCG capsule TAKE 1 CAPSULE (24 MCG TOTAL) BY MOUTH 2 (TWO) TIMES DAILY WITH A MEAL. 180 capsule 1   methylphenidate (RITALIN) 10 MG tablet Take 10 mg by mouth 2 (two) times daily.     multivitamin (ONE-A-DAY MEN'S) TABS tablet Take 1 tablet by mouth daily. 90 tablet 3   mupirocin ointment (BACTROBAN) 2 % Apply 1 Application topically 2 (two) times daily. 22 g 0   NEEDLE, DISP, 25 G (B-D DISP NEEDLE 25GX1") 25G X 1" MISC 1 Device by Does not apply route every 30 (thirty) days. 12 each 4   QUEtiapine (SEROQUEL) 400 MG tablet Take 800 mg by mouth at bedtime.     simvastatin (ZOCOR) 40 MG tablet TAKE 1 TABLET BY MOUTH EVERYDAY AT BEDTIME 90 tablet 3   sulfamethoxazole-trimethoprim (BACTRIM DS) 800-160 MG tablet Take 1 tablet by mouth 2 (two) times daily. 20 tablet 0   triamcinolone cream (KENALOG) 0.1 % Apply 1 application topically 4 (four) times daily. As needed for rash 80 g 2   No current facility-administered medications on  file prior to visit.     ROS see history of present illness  Objective  Physical Exam Vitals:   09/13/23 1122  BP: 122/76  Pulse: 80  Temp: 97.8 F (36.6 C)  SpO2: 96%    BP Readings from Last 3 Encounters:  09/13/23 122/76  06/20/23 132/84  05/24/23 (!) 90/50   Wt Readings from Last 3 Encounters:  09/13/23 195 lb (88.5 kg)  06/20/23 193 lb 12.8 oz (87.9 kg)  05/24/23 189 lb 9.6 oz (86 kg)    Physical Exam Constitutional:      General: He is not in acute distress.    Appearance: He is not diaphoretic.  Cardiovascular:     Rate and Rhythm: Normal rate and regular rhythm.     Heart sounds: Normal heart sounds.  Pulmonary:     Effort: Pulmonary effort is normal.     Breath sounds: Normal breath sounds.  Musculoskeletal:     Comments: Bilateral shoulders with  full range of motion, patient has discomfort in active and passive full range of motion in the right shoulder, negative empty can bilaterally, positive speeds on the right, right shoulder is nontender, left knee is nontender, no warmth, swelling, or erythema, negative McMurray's  Skin:    General: Skin is warm and dry.  Neurological:     Mental Status: He is alert.      Assessment/Plan: Please see individual problem list.  B12 deficiency Assessment & Plan: Chronic issue.  Recheck labs today.  Orders: -     Vitamin B12 -     Intrinsic Factor Antibodies  Seborrheic dermatitis -     Ketoconazole; 1 APPLICATION TOPICALLY. APPLY TO FACE 3 NIGHTS WEEKLY, TUESDAY, THURSDAY AND SATURDAY  Dispense: 60 g; Refill: 0  Vitamin D deficiency Assessment & Plan: Chronic issue.  Recheck today.  Continue vitamin D 4000 international units daily.  Orders: -     VITAMIN D 25 Hydroxy (Vit-D Deficiency, Fractures)  Chronic right shoulder pain Assessment & Plan: Chronic issue at this point.  We will get an x-ray of his shoulder.  Will trial meloxicam.  He is advised to take this with food.  We are checking kidney function given that we are starting meloxicam.  He will be referred to orthopedics as well.  Orders: -     DG Shoulder Right; Future -     Basic metabolic panel -     Ambulatory referral to Orthopedic Surgery  Chronic pain of left knee Assessment & Plan: Chronic issue.  Check x-ray today.  Refer to orthopedics.  Orders: -     DG Knee Complete 4 Views Left; Future -     Ambulatory referral to Orthopedic Surgery  Anxiety and depression Assessment & Plan: Chronic issue.  Patient will continue to see psychiatry for this.   Right leg weakness Assessment & Plan: Concerned that this could have been related to TIA or stroke.  We will order an MRI.  Advised to seek medical attention if he has any recurrent symptoms.  Orders: -     MR BRAIN WO CONTRAST; Future  Other orders -      Cyclobenzaprine HCl; Take 1 tablet (10 mg total) by mouth 3 (three) times daily as needed.  Dispense: 30 tablet; Refill: 0 -     Meloxicam; Take 1 tablet (15 mg total) by mouth daily.  Dispense: 30 tablet; Refill: 0     Return for As scheduled for transfer of care.   Marikay Alar, MD [place]  Primary Care - [employer]

## 2023-09-13 NOTE — Assessment & Plan Note (Signed)
Concerned that this could have been related to TIA or stroke.  We will order an MRI.  Advised to seek medical attention if he has any recurrent symptoms.

## 2023-09-13 NOTE — Assessment & Plan Note (Signed)
Chronic issue at this point.  We will get an x-ray of his shoulder.  Will trial meloxicam.  He is advised to take this with food.  We are checking kidney function given that we are starting meloxicam.  He will be referred to orthopedics as well.

## 2023-09-13 NOTE — Patient Instructions (Addendum)
Nice to see you. Please go to the outpatient imaging center off of [address] to get your x-rays today. Somebody will call you to schedule the MRI of your brain.  If you have recurrent weakness please go to the emergency room. Please try the meloxicam to help with your pain.  Please take this with food.  If it irritates your stomach please let us know and please discontinue use of the meloxicam.

## 2023-09-13 NOTE — Assessment & Plan Note (Signed)
Chronic issue.  Recheck labs today.

## 2023-09-16 ENCOUNTER — Ambulatory Visit
Admission: RE | Admit: 2023-09-16 | Discharge: 2023-09-16 | Disposition: A | Discharge status: Home / Self Care | Payer: Medicare Other | Source: Ambulatory Visit | Attending: Family Medicine | Admitting: Family Medicine

## 2023-09-16 DIAGNOSIS — R29898 Other symptoms and signs involving the musculoskeletal system: Secondary | ICD-10-CM | POA: Insufficient documentation

## 2023-09-16 LAB — INTRINSIC FACTOR ANTIBODIES: Intrinsic Factor: NEGATIVE

## 2023-09-23 ENCOUNTER — Other Ambulatory Visit: Payer: Self-pay | Admitting: Family Medicine

## 2023-09-27 NOTE — Telephone Encounter (Signed)
I sent this in on 09/13/2023.  Please find out if he needs a refill already.  Thanks.

## 2023-09-29 ENCOUNTER — Telehealth: Payer: Self-pay

## 2023-09-29 NOTE — Telephone Encounter (Signed)
Called Patient no answer

## 2023-09-29 NOTE — Telephone Encounter (Signed)
Noted  

## 2023-09-29 NOTE — Telephone Encounter (Signed)
Error

## 2023-09-29 NOTE — Telephone Encounter (Signed)
Patient states he is at [facility] for his orthopaedic visit and they do not have a copy of his x-rays.  Patient states he is having some memory issues.  Patient states he would like to know where he had his MRI, his x-ray of his knee, and x-ray of his shoulder.  I let him know that he had his MRI at [facility] and he had the other x-rays at [facility] on [address].  Patient was able to share this information with the person he is speaking with at [facility] and they said they have them.    Patient states he is very frustrated because he was 15 minutes late for his appointment with [facility] because he didn't see any signs for the Orthopaedic department.

## 2023-10-02 ENCOUNTER — Encounter: Payer: Self-pay | Admitting: *Deleted

## 2023-10-03 ENCOUNTER — Telehealth: Payer: Self-pay

## 2023-10-03 ENCOUNTER — Other Ambulatory Visit: Payer: Self-pay | Admitting: Student

## 2023-10-03 DIAGNOSIS — Z9889 Other specified postprocedural states: Secondary | ICD-10-CM

## 2023-10-03 DIAGNOSIS — M86662 Other chronic osteomyelitis, left tibia and fibula: Secondary | ICD-10-CM

## 2023-10-03 DIAGNOSIS — G8929 Other chronic pain: Secondary | ICD-10-CM

## 2023-10-03 DIAGNOSIS — M1732 Unilateral post-traumatic osteoarthritis, left knee: Secondary | ICD-10-CM

## 2023-10-03 NOTE — Telephone Encounter (Signed)
Patient states he was seen at [facility], but they were unable to help him with his knee and shoulder.  Patient states he is not sure what his next step should be.  Patient states he would like for [place] to please call him.

## 2023-10-04 NOTE — Telephone Encounter (Signed)
Spoke to Patient and he stated that [facility] called and said the MRI's will be on 10/21/23 and gave him Meloxicam so I advised him about the Voltaren and he said he will get some and try it.

## 2023-10-04 NOTE — Telephone Encounter (Signed)
Noted.  I have reviewed their note.  It looks like they ordered an MRI of his shoulder and knee to further evaluate his issue.  It also looks like they advised he could use meloxicam for pain and use topical Voltaren for pain.  Is he aware that they order the MRIs and is he aware of what they advised him to try for the pain?

## 2023-10-04 NOTE — Telephone Encounter (Signed)
Error

## 2023-10-20 ENCOUNTER — Encounter: Payer: Medicare Other | Admitting: Family Medicine

## 2023-10-21 ENCOUNTER — Ambulatory Visit
Admission: RE | Admit: 2023-10-21 | Discharge: 2023-10-21 | Disposition: A | Discharge status: Home / Self Care | Payer: Medicare Other | Source: Ambulatory Visit | Attending: Student | Admitting: Student

## 2023-10-21 DIAGNOSIS — M86662 Other chronic osteomyelitis, left tibia and fibula: Secondary | ICD-10-CM

## 2023-10-21 DIAGNOSIS — M1732 Unilateral post-traumatic osteoarthritis, left knee: Secondary | ICD-10-CM

## 2023-10-21 DIAGNOSIS — G8929 Other chronic pain: Secondary | ICD-10-CM

## 2023-10-21 DIAGNOSIS — Z9889 Other specified postprocedural states: Secondary | ICD-10-CM

## 2023-11-01 ENCOUNTER — Telehealth (INDEPENDENT_AMBULATORY_CARE_PROVIDER_SITE_OTHER): Payer: Medicare Other | Admitting: Internal Medicine

## 2023-11-01 ENCOUNTER — Encounter: Payer: Self-pay | Admitting: Internal Medicine

## 2023-11-01 VITALS — Ht 68.0 in | Wt 195.0 lb

## 2023-11-01 DIAGNOSIS — J0140 Acute pansinusitis, unspecified: Secondary | ICD-10-CM

## 2023-11-01 DIAGNOSIS — J329 Chronic sinusitis, unspecified: Secondary | ICD-10-CM | POA: Insufficient documentation

## 2023-11-01 MED ORDER — OXYMETAZOLINE HCL 0.05 % NA SOLN: 1.0000 | Freq: Two times a day (BID) | NASAL | 0 refills | Status: DC

## 2023-11-01 MED ORDER — DEXTROMETHORPHAN HBR 15 MG/5ML PO SYRP
10.0000 mL | ORAL_SOLUTION | Freq: Four times a day (QID) | ORAL | 0 refills | Status: DC | PRN
Start: 2023-11-01 — End: 2024-01-16

## 2023-11-01 MED ORDER — AMOXICILLIN-POT CLAVULANATE 875-125 MG PO TABS: 1.0000 | ORAL_TABLET | Freq: Two times a day (BID) | ORAL | 0 refills | Status: DC

## 2023-11-01 MED ORDER — PREDNISONE 10 MG PO TABS: ORAL_TABLET | ORAL | 0 refills | Status: DC

## 2023-11-01 NOTE — Telephone Encounter (Signed)
 Noted.  Patient needs an appointment for evaluation.  Thanks.

## 2023-11-01 NOTE — Patient Instructions (Signed)
  I am treating you for sinusitis (sinus infection)   I am prescribing an antibiotic (augmentin  ) and a prednisone  taper  To manage the infection and the inflammation in your inuses.   The prednisone  should be taken as follows:   6 tablets all at once on Day 1 5 tablets all at once on Day 2 4 tablets all at once on Day 3 3 tablets all at once on Day 4  2 tablets all at once on Day 5 1 on Day 6   I also advise use of the following OTC meds to help with your other symptoms.   Pseudoephedrine and phenylephrine  are both oral decongestants. They can cause increased pulse,  increased blood pressure and insomnia.  STOP USING THE COLD REMEDY YOU HAVE BECAUSE IT CONTAINS ONE OF THESE    you may substitute Afrin nasal spray if you do not tolerate oral decongestants.  It is a 12 hour nasal spray.  You should not use for more than 5 days in a row  (or you will get rebound congestion when you stop using it).  You can use it once daily at night for a longer period of time  instead of the oral decongestants  If needed to prevent insomnia. Dextromethorphan  is  COUGH SUPPRESSANT .  I HAVE PRESCRIBED THIS  Gargle with salt water as needed for sore throat

## 2023-11-01 NOTE — Assessment & Plan Note (Addendum)
 Given chronicity of symptoms, development of facial pain and exam consistent with bacterial URI,  Will treat with empiric antibiotics, topical decongestants, and prednisone taper.

## 2023-11-01 NOTE — Telephone Encounter (Signed)
 Left message for the Patient to call back and schedule an appointment for this.
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
## 2023-11-01 NOTE — Telephone Encounter (Signed)
 Copied from CRM [id number]. Topic: Clinical - Medication Question >> Nov 01, 2023 10:15 AM Drema MATSU wrote: Reason for CRM: Patient states that he has a really bad cold and head cold. He has a sore throat, nose stopped up, hoarse voice, itchy runny nose. He wants to be prescribed something and wants it sent to [facility] on [address]

## 2023-11-01 NOTE — Telephone Encounter (Signed)
 Pt is scheduled with Dr. Darrick Huntsman on today

## 2023-12-01 ENCOUNTER — Other Ambulatory Visit: Payer: Self-pay | Admitting: Nurse Practitioner

## 2023-12-01 ENCOUNTER — Other Ambulatory Visit: Payer: Self-pay | Admitting: Family Medicine

## 2023-12-01 DIAGNOSIS — I10 Essential (primary) hypertension: Secondary | ICD-10-CM

## 2023-12-01 DIAGNOSIS — L219 Seborrheic dermatitis, unspecified: Secondary | ICD-10-CM

## 2023-12-08 DIAGNOSIS — Z9889 Other specified postprocedural states: Secondary | ICD-10-CM | POA: Insufficient documentation

## 2023-12-26 ENCOUNTER — Encounter: Payer: Medicare Other | Admitting: Family Medicine

## 2024-01-04 ENCOUNTER — Other Ambulatory Visit: Payer: Self-pay | Admitting: Surgery

## 2024-01-08 ENCOUNTER — Other Ambulatory Visit: Payer: Self-pay

## 2024-01-08 ENCOUNTER — Encounter
Admission: RE | Admit: 2024-01-08 | Discharge: 2024-01-08 | Disposition: A | Discharge status: Home / Self Care | Source: Ambulatory Visit | Attending: Surgery | Admitting: Surgery

## 2024-01-08 VITALS — Ht 68.0 in | Wt 184.0 lb

## 2024-01-08 DIAGNOSIS — R7303 Prediabetes: Secondary | ICD-10-CM

## 2024-01-08 DIAGNOSIS — I10 Essential (primary) hypertension: Secondary | ICD-10-CM

## 2024-01-08 DIAGNOSIS — I517 Cardiomegaly: Secondary | ICD-10-CM

## 2024-01-08 DIAGNOSIS — E66811 Obesity, class 1: Secondary | ICD-10-CM

## 2024-01-08 HISTORY — DX: Cardiac murmur, unspecified: R01.1

## 2024-01-08 HISTORY — DX: Personal history of urinary calculi: Z87.442

## 2024-01-08 NOTE — Patient Instructions (Addendum)
 Your procedure is scheduled on: Tuesday 01/16/24 Report to the Registration Desk on the 1st floor of the [facility]. To find out your arrival time, please call [phone] between 1PM - 3PM on: Monday 01/15/24 If your arrival time is 6:00 am, do not arrive before that time as the [facility] entrance doors do not open until 6:00 am.  REMEMBER: Instructions that are not followed completely may result in serious medical risk, up to and including death; or upon the discretion of your surgeon and anesthesiologist your surgery may need to be rescheduled.  Do not eat food after midnight the night before surgery.  No gum chewing or hard candies.  You may however, drink CLEAR liquids up to 2 hours before you are scheduled to arrive for your surgery. Do not drink anything within 2 hours of your scheduled arrival time.  Clear liquids include: - water  - apple juice without pulp - black coffee or tea (Do NOT add milk or creamers to the coffee or tea) Do NOT drink anything that is not on this list.   In addition, your doctor has ordered for you to drink the provided:  Ensure Pre-Surgery Clear Carbohydrate Drink  Gatorade G2 Drinking this carbohydrate drink up to two hours before surgery helps to reduce insulin resistance and improve patient outcomes. Please complete drinking 2 hours before scheduled arrival time.  One week prior to surgery: Stop Anti-inflammatories (NSAIDS) such as Advil, Aleve, Ibuprofen, Motrin, Naproxen, Naprosyn and Aspirin based products such as Excedrin, Goody's Powder, BC Powder. Stop ANY OVER THE COUNTER supplements until after surgery.  You may however, continue to take Tylenol if needed for pain up until the day of surgery.   Continue taking all of your other prescription medications up until the day of surgery.  ON THE DAY OF SURGERY ONLY TAKE THESE MEDICATIONS WITH SIPS OF WATER:  buPROPion (WELLBUTRIN XL) 150 MG    No Alcohol for 24 hours before or after  surgery.  No Smoking including e-cigarettes for 24 hours before surgery.  No chewable tobacco products for at least 6 hours before surgery.  No nicotine patches on the day of surgery.  Do not use any "recreational" drugs for at least a week (preferably 2 weeks) before your surgery.  Please be advised that the combination of cocaine and anesthesia may have negative outcomes, up to and including death. If you test positive for cocaine, your surgery will be cancelled.  On the morning of surgery brush your teeth with toothpaste and water, you may rinse your mouth with mouthwash if you wish. Do not swallow any toothpaste or mouthwash.  Use CHG Soap or wipes as directed on instruction sheet.  Do not wear jewelry, make-up, hairpins, clips or nail polish.  For welded (permanent) jewelry: bracelets, anklets, waist bands, etc.  Please have this removed prior to surgery.  If it is not removed, there is a chance that hospital personnel will need to cut it off on the day of surgery.  Do not wear lotions, powders, or perfumes.   Do not shave body hair from the neck down 48 hours before surgery.  Contact lenses, hearing aids and dentures may not be worn into surgery.  Do not bring valuables to the hospital. [facility] is not responsible for any missing/lost belongings or valuables.   Notify your doctor if there is any change in your medical condition (cold, fever, infection).  Wear comfortable clothing (specific to your surgery type) to the hospital.  After  surgery, you can help prevent lung complications by doing breathing exercises.  Take deep breaths and cough every 1-2 hours. Your doctor may order a device called an Incentive Spirometer to help you take deep breaths. When coughing or sneezing, hold a pillow firmly against your incision with both hands. This is called "splinting." Doing this helps protect your incision. It also decreases belly discomfort.  If you are being admitted to the  hospital overnight, leave your suitcase in the car. After surgery it may be brought to your room.  In case of increased patient census, it may be necessary for you, the patient, to continue your postoperative care in the Same Day Surgery department.  If you are being discharged the day of surgery, you will not be allowed to drive home. You will need a responsible individual to drive you home and stay with you for 24 hours after surgery.   If you are taking public transportation, you will need to have a responsible individual with you.  Please call the Pre-admissions Testing Dept. at [phone] if you have any questions about these instructions.  Surgery Visitation Policy:  Patients having surgery or a procedure may have two visitors.  Children under the age of 65 must have an adult with them who is not the patient.  Temporary Visitor Restrictions Due to increasing cases of flu, RSV and COVID-19: Children ages 8 and under will not be able to visit patients in [facility] hospitals under most circumstances.  Inpatient Visitation:    Visiting hours are 7 a.m. to 8 p.m. Up to four visitors are allowed at one time in a patient room. The visitors may rotate out with other people during the day.  One visitor age 14 or older may stay with the patient overnight and must be in the room by 8 p.m.  How to Use an Incentive Spirometer  An incentive spirometer is a tool that measures how well you are filling your lungs with each breath. Learning to take long, deep breaths using this tool can help you keep your lungs clear and active. This may help to reverse or lessen your chance of developing breathing (pulmonary) problems, especially infection. You may be asked to use a spirometer: After a surgery. If you have a lung problem or a history of smoking. After a long period of time when you have been unable to move or be active. If the spirometer includes an indicator to show the highest number  that you have reached, your health care provider or respiratory therapist will help you set a goal. Keep a log of your progress as told by your health care provider. What are the risks? Breathing too quickly may cause dizziness or cause you to pass out. Take your time so you do not get dizzy or light-headed. If you are in pain, you may need to take pain medicine before doing incentive spirometry. It is harder to take a deep breath if you are having pain. How to use your incentive spirometer  Sit up on the edge of your bed or on a chair. Hold the incentive spirometer so that it is in an upright position. Before you use the spirometer, breathe out normally. Place the mouthpiece in your mouth. Make sure your lips are closed tightly around it. Breathe in slowly and as deeply as you can through your mouth, causing the piston or the ball to rise toward the top of the chamber. Hold your breath for 3-5 seconds, or for as  long as possible. If the spirometer includes a coach indicator, use this to guide you in breathing. Slow down your breathing if the indicator goes above the marked areas. Remove the mouthpiece from your mouth and breathe out normally. The piston or ball will return to the bottom of the chamber. Rest for a few seconds, then repeat the steps 10 or more times. Take your time and take a few normal breaths between deep breaths so that you do not get dizzy or light-headed. Do this every 1-2 hours when you are awake. If the spirometer includes a goal marker to show the highest number you have reached (best effort), use this as a goal to work toward during each repetition. After each set of 10 deep breaths, cough a few times. This will help to make sure that your lungs are clear. If you have an incision on your chest or abdomen from surgery, place a pillow or a rolled-up towel firmly against the incision when you cough. This can help to reduce pain while taking deep breaths and coughing. General  tips When you are able to get out of bed: Walk around often. Continue to take deep breaths and cough in order to clear your lungs. Keep using the incentive spirometer until your health care provider says it is okay to stop using it. If you have been in the hospital, you may be told to keep using the spirometer at home. Contact a health care provider if: You are having difficulty using the spirometer. You have trouble using the spirometer as often as instructed. Your pain medicine is not giving enough relief for you to use the spirometer as told. You have a fever. Get help right away if: You develop shortness of breath. You develop a cough with bloody mucus from the lungs. You have fluid or blood coming from an incision site after you cough. Summary An incentive spirometer is a tool that can help you learn to take long, deep breaths to keep your lungs clear and active. You may be asked to use a spirometer after a surgery, if you have a lung problem or a history of smoking, or if you have been inactive for a long period of time. Use your incentive spirometer as instructed every 1-2 hours while you are awake. If you have an incision on your chest or abdomen, place a pillow or a rolled-up towel firmly against your incision when you cough. This will help to reduce pain. Get help right away if you have shortness of breath, you cough up bloody mucus, or blood comes from your incision when you cough. This information is not intended to replace advice given to you by your health care provider. Make sure you discuss any questions you have with your health care provider.       Preparing for Surgery with CHLORHEXIDINE GLUCONATE (CHG) Soap  Chlorhexidine Gluconate (CHG) Soap  o An antiseptic cleaner that kills germs and bonds with the skin to continue killing germs even after washing  o Used for showering the night before surgery and morning of surgery  Before surgery, you can play an important  role by reducing the number of germs on your skin.  CHG (Chlorhexidine gluconate) soap is an antiseptic cleanser which kills germs and bonds with the skin to continue killing germs even after washing.  Please do not use if you have an allergy to CHG or antibacterial soaps. If your skin becomes reddened/irritated stop using the CHG.  1. Shower the [employer]  BEFORE SURGERY and the MORNING OF SURGERY with CHG soap.  2. If you choose to wash your hair, wash your hair first as usual with your normal shampoo.  3. After shampooing, rinse your hair and body thoroughly to remove the shampoo.  4. Use CHG as you would any other liquid soap. You can apply CHG directly to the skin and wash gently with a scrungie or a clean washcloth.  5. Apply the CHG soap to your body only from the neck down. Do not use on open wounds or open sores. Avoid contact with your eyes, ears, mouth, and genitals (private parts). Wash face and genitals (private parts) with your normal soap.  6. Wash thoroughly, paying special attention to the area where your surgery will be performed.  7. Thoroughly rinse your body with warm water.  8. Do not shower/wash with your normal soap after using and rinsing off the CHG soap.  9. Pat yourself dry with a clean towel.  10. Wear clean pajamas to bed the night before surgery.  12. Place clean sheets on your bed the night of your first shower and do not sleep with pets.  13. Shower again with the CHG soap on the day of surgery prior to arriving at the hospital.  14. Do not apply any deodorants/lotions/powders.  15. Please wear clean clothes to the hospital.

## 2024-01-10 ENCOUNTER — Encounter
Admission: RE | Admit: 2024-01-10 | Discharge: 2024-01-10 | Disposition: A | Discharge status: Home / Self Care | Source: Ambulatory Visit | Attending: Surgery | Admitting: Surgery

## 2024-01-10 DIAGNOSIS — Z6830 Body mass index (BMI) 30.0-30.9, adult: Secondary | ICD-10-CM | POA: Diagnosis not present

## 2024-01-10 DIAGNOSIS — I517 Cardiomegaly: Secondary | ICD-10-CM | POA: Diagnosis not present

## 2024-01-10 DIAGNOSIS — R7303 Prediabetes: Secondary | ICD-10-CM | POA: Diagnosis not present

## 2024-01-10 DIAGNOSIS — E66811 Obesity, class 1: Secondary | ICD-10-CM | POA: Diagnosis not present

## 2024-01-10 DIAGNOSIS — I10 Essential (primary) hypertension: Secondary | ICD-10-CM | POA: Diagnosis not present

## 2024-01-10 DIAGNOSIS — Z01818 Encounter for other preprocedural examination: Secondary | ICD-10-CM | POA: Diagnosis present

## 2024-01-10 DIAGNOSIS — Z01810 Encounter for preprocedural cardiovascular examination: Secondary | ICD-10-CM | POA: Diagnosis not present

## 2024-01-10 LAB — CBC
HCT: 38 % — ABNORMAL LOW (ref 39.0–52.0)
Hemoglobin: 13.2 g/dL (ref 13.0–17.0)
MCH: 29.3 pg (ref 26.0–34.0)
MCHC: 34.7 g/dL (ref 30.0–36.0)
MCV: 84.4 fL (ref 80.0–100.0)
Platelets: 220 10*3/uL (ref 150–400)
RBC: 4.5 MIL/uL (ref 4.22–5.81)
RDW: 13.5 % (ref 11.5–15.5)
WBC: 6.9 10*3/uL (ref 4.0–10.5)
nRBC: 0 % (ref 0.0–0.2)

## 2024-01-10 LAB — BASIC METABOLIC PANEL
Anion gap: 7 (ref 5–15)
BUN: 31 mg/dL — ABNORMAL HIGH (ref 6–20)
CO2: 25 mmol/L (ref 22–32)
Calcium: 8.8 mg/dL — ABNORMAL LOW (ref 8.9–10.3)
Chloride: 105 mmol/L (ref 98–111)
Creatinine, Ser: 1.01 mg/dL (ref 0.61–1.24)
GFR, Estimated: >60 mL/min (ref >60)
Glucose, Bld: 68 mg/dL — ABNORMAL LOW (ref 70–99)
Potassium: 4.2 mmol/L (ref 3.5–5.1)
Sodium: 137 mmol/L (ref 135–145)

## 2024-01-15 MED ORDER — CEFAZOLIN SODIUM-DEXTROSE 2-4 GM/100ML-% IV SOLN
2.0000 g | INTRAVENOUS | Status: AC
  Administered 2024-01-16: 2 g via INTRAVENOUS

## 2024-01-15 MED ORDER — ORAL CARE MOUTH RINSE: 15.0000 mL | Freq: Once | OROMUCOSAL | Status: AC

## 2024-01-15 MED ORDER — CHLORHEXIDINE GLUCONATE 0.12 % MT SOLN
15.0000 mL | Freq: Once | OROMUCOSAL | Status: AC
  Administered 2024-01-16: 15 mL via OROMUCOSAL

## 2024-01-15 MED ORDER — LACTATED RINGERS IV SOLN: INTRAVENOUS | Status: DC

## 2024-01-16 ENCOUNTER — Ambulatory Visit: Payer: Self-pay | Admitting: Urgent Care

## 2024-01-16 ENCOUNTER — Other Ambulatory Visit: Payer: Self-pay

## 2024-01-16 ENCOUNTER — Ambulatory Visit

## 2024-01-16 ENCOUNTER — Encounter
Admission: RE | Disposition: A | Discharge status: Home / Self Care | Payer: Self-pay | Source: Home / Self Care | Attending: Surgery

## 2024-01-16 ENCOUNTER — Ambulatory Visit: Admitting: General Practice

## 2024-01-16 ENCOUNTER — Ambulatory Visit
Admission: RE | Admit: 2024-01-16 | Discharge: 2024-01-16 | Disposition: A | Discharge status: Home / Self Care | Attending: Surgery | Admitting: Surgery

## 2024-01-16 ENCOUNTER — Encounter: Payer: Self-pay | Admitting: Surgery

## 2024-01-16 DIAGNOSIS — M25811 Other specified joint disorders, right shoulder: Secondary | ICD-10-CM | POA: Insufficient documentation

## 2024-01-16 DIAGNOSIS — M75121 Complete rotator cuff tear or rupture of right shoulder, not specified as traumatic: Secondary | ICD-10-CM | POA: Insufficient documentation

## 2024-01-16 DIAGNOSIS — I10 Essential (primary) hypertension: Secondary | ICD-10-CM | POA: Diagnosis not present

## 2024-01-16 DIAGNOSIS — S43431A Superior glenoid labrum lesion of right shoulder, initial encounter: Secondary | ICD-10-CM | POA: Insufficient documentation

## 2024-01-16 DIAGNOSIS — F319 Bipolar disorder, unspecified: Secondary | ICD-10-CM | POA: Diagnosis not present

## 2024-01-16 DIAGNOSIS — X58XXXA Exposure to other specified factors, initial encounter: Secondary | ICD-10-CM | POA: Insufficient documentation

## 2024-01-16 DIAGNOSIS — F419 Anxiety disorder, unspecified: Secondary | ICD-10-CM | POA: Insufficient documentation

## 2024-01-16 DIAGNOSIS — K5903 Drug induced constipation: Secondary | ICD-10-CM

## 2024-01-16 HISTORY — PX: POSTERIOR LUMBAR FUSION 2 WITH HARDWARE REMOVAL: SHX7297

## 2024-01-16 SURGERY — ARTHROSCOPY, SHOULDER WITH DEBRIDEMENT
Anesthesia: General | Site: Shoulder | Laterality: Right

## 2024-01-16 MED ORDER — FENTANYL CITRATE PF 50 MCG/ML IJ SOSY
50.0000 ug | PREFILLED_SYRINGE | Freq: Once | INTRAMUSCULAR | Status: AC
  Administered 2024-01-16: 50 ug via INTRAVENOUS

## 2024-01-16 MED ORDER — BUPIVACAINE-EPINEPHRINE 0.5% -1:200000 IJ SOLN
INTRAMUSCULAR | Status: DC | PRN
  Administered 2024-01-16: 30 mL

## 2024-01-16 MED ORDER — CHLORHEXIDINE GLUCONATE 0.12 % MT SOLN
OROMUCOSAL | Status: AC
  Filled 2024-01-16: qty 15

## 2024-01-16 MED ORDER — FENTANYL CITRATE PF 50 MCG/ML IJ SOSY
PREFILLED_SYRINGE | INTRAMUSCULAR | Status: AC
  Filled 2024-01-16: qty 1

## 2024-01-16 MED ORDER — PHENYLEPHRINE 80 MCG/ML (10ML) SYRINGE FOR IV PUSH (FOR BLOOD PRESSURE SUPPORT)
PREFILLED_SYRINGE | INTRAVENOUS | Status: DC | PRN
  Administered 2024-01-16 (×3): 160 ug via INTRAVENOUS

## 2024-01-16 MED ORDER — ACETAMINOPHEN 10 MG/ML IV SOLN
INTRAVENOUS | Status: AC
  Filled 2024-01-16: qty 100

## 2024-01-16 MED ORDER — ROCURONIUM BROMIDE 10 MG/ML (PF) SYRINGE
PREFILLED_SYRINGE | INTRAVENOUS | Status: AC
  Filled 2024-01-16: qty 10

## 2024-01-16 MED ORDER — OXYCODONE HCL 5 MG PO TABS: 10.0000 mg | ORAL_TABLET | ORAL | Status: DC | PRN

## 2024-01-16 MED ORDER — ONDANSETRON HCL 4 MG/2ML IJ SOLN
INTRAMUSCULAR | Status: DC | PRN
  Administered 2024-01-16 (×2): 4 mg via INTRAVENOUS

## 2024-01-16 MED ORDER — DEXTROSE-SODIUM CHLORIDE 5-0.9 % IV SOLN: INTRAVENOUS | Status: DC

## 2024-01-16 MED ORDER — BUPIVACAINE LIPOSOME 1.3 % IJ SUSP
INTRAMUSCULAR | Status: DC | PRN
  Administered 2024-01-16: 20 mL

## 2024-01-16 MED ORDER — ACETAMINOPHEN 325 MG PO TABS: 325.0000 mg | ORAL_TABLET | Freq: Four times a day (QID) | ORAL | Status: DC | PRN

## 2024-01-16 MED ORDER — PHENYLEPHRINE HCL-NACL 20-0.9 MG/250ML-% IV SOLN
INTRAVENOUS | Status: DC | PRN
  Administered 2024-01-16: 20 ug/min via INTRAVENOUS

## 2024-01-16 MED ORDER — LIDOCAINE HCL (CARDIAC) PF 100 MG/5ML IV SOSY
PREFILLED_SYRINGE | INTRAVENOUS | Status: DC | PRN
  Administered 2024-01-16: 100 mg via INTRAVENOUS

## 2024-01-16 MED ORDER — FENTANYL CITRATE (PF) 100 MCG/2ML IJ SOLN
INTRAMUSCULAR | Status: DC | PRN
  Administered 2024-01-16: 50 ug via INTRAVENOUS

## 2024-01-16 MED ORDER — OXYCODONE HCL 5 MG PO TABS: 5.0000 mg | ORAL_TABLET | Freq: Once | ORAL | Status: DC | PRN

## 2024-01-16 MED ORDER — DEXMEDETOMIDINE HCL IN NACL 200 MCG/50ML IV SOLN
INTRAVENOUS | Status: DC | PRN
  Administered 2024-01-16: 12 ug via INTRAVENOUS
  Administered 2024-01-16: 8 ug via INTRAVENOUS

## 2024-01-16 MED ORDER — FENTANYL CITRATE (PF) 100 MCG/2ML IJ SOLN
INTRAMUSCULAR | Status: AC
  Filled 2024-01-16: qty 2

## 2024-01-16 MED ORDER — LUBIPROSTONE 24 MCG PO CAPS: 24.0000 ug | ORAL_CAPSULE | Freq: Every day | ORAL | Status: DC | PRN

## 2024-01-16 MED ORDER — BUPIVACAINE LIPOSOME 1.3 % IJ SUSP
INTRAMUSCULAR | Status: AC
  Filled 2024-01-16: qty 20

## 2024-01-16 MED ORDER — KETOROLAC TROMETHAMINE 30 MG/ML IJ SOLN
30.0000 mg | Freq: Once | INTRAMUSCULAR | Status: AC
  Administered 2024-01-16: 30 mg via INTRAVENOUS

## 2024-01-16 MED ORDER — DEXAMETHASONE SODIUM PHOSPHATE 10 MG/ML IJ SOLN
INTRAMUSCULAR | Status: DC | PRN
  Administered 2024-01-16: 10 mg via INTRAVENOUS

## 2024-01-16 MED ORDER — MIDAZOLAM HCL 2 MG/2ML IJ SOLN
INTRAMUSCULAR | Status: AC
  Filled 2024-01-16: qty 2

## 2024-01-16 MED ORDER — KETOROLAC TROMETHAMINE 30 MG/ML IJ SOLN
INTRAMUSCULAR | Status: AC
  Filled 2024-01-16: qty 1

## 2024-01-16 MED ORDER — PROPOFOL 10 MG/ML IV BOLUS
INTRAVENOUS | Status: AC
  Filled 2024-01-16: qty 20

## 2024-01-16 MED ORDER — DEXAMETHASONE SODIUM PHOSPHATE 10 MG/ML IJ SOLN
INTRAMUSCULAR | Status: AC
  Filled 2024-01-16: qty 1

## 2024-01-16 MED ORDER — LACTATED RINGERS IV SOLN
INTRAVENOUS | Status: DC | PRN
  Administered 2024-01-16: 1 mL

## 2024-01-16 MED ORDER — EPHEDRINE SULFATE-NACL 50-0.9 MG/10ML-% IV SOSY
PREFILLED_SYRINGE | INTRAVENOUS | Status: DC | PRN
  Administered 2024-01-16 (×2): 5 mg via INTRAVENOUS

## 2024-01-16 MED ORDER — METOCLOPRAMIDE HCL 10 MG PO TABS: 5.0000 mg | ORAL_TABLET | Freq: Three times a day (TID) | ORAL | Status: DC | PRN

## 2024-01-16 MED ORDER — ONDANSETRON HCL 4 MG/2ML IJ SOLN: 4.0000 mg | Freq: Four times a day (QID) | INTRAMUSCULAR | Status: DC | PRN

## 2024-01-16 MED ORDER — METOCLOPRAMIDE HCL 5 MG/ML IJ SOLN: 5.0000 mg | Freq: Three times a day (TID) | INTRAMUSCULAR | Status: DC | PRN

## 2024-01-16 MED ORDER — BUPIVACAINE-EPINEPHRINE (PF) 0.5% -1:200000 IJ SOLN
INTRAMUSCULAR | Status: AC
  Filled 2024-01-16: qty 30

## 2024-01-16 MED ORDER — ROCURONIUM BROMIDE 100 MG/10ML IV SOLN
INTRAVENOUS | Status: DC | PRN
  Administered 2024-01-16: 50 mg via INTRAVENOUS

## 2024-01-16 MED ORDER — CEFAZOLIN SODIUM-DEXTROSE 2-4 GM/100ML-% IV SOLN
INTRAVENOUS | Status: AC
  Filled 2024-01-16: qty 100

## 2024-01-16 MED ORDER — FENTANYL CITRATE (PF) 100 MCG/2ML IJ SOLN: 25.0000 ug | INTRAMUSCULAR | Status: DC | PRN

## 2024-01-16 MED ORDER — ONDANSETRON HCL 4 MG PO TABS: 4.0000 mg | ORAL_TABLET | Freq: Four times a day (QID) | ORAL | Status: DC | PRN

## 2024-01-16 MED ORDER — SUGAMMADEX SODIUM 200 MG/2ML IV SOLN
INTRAVENOUS | Status: DC | PRN
  Administered 2024-01-16: 200 mg via INTRAVENOUS

## 2024-01-16 MED ORDER — BUPIVACAINE HCL (PF) 0.5 % IJ SOLN
INTRAMUSCULAR | Status: AC
  Filled 2024-01-16: qty 10

## 2024-01-16 MED ORDER — MIDAZOLAM HCL 2 MG/2ML IJ SOLN
1.0000 mg | INTRAMUSCULAR | Status: AC | PRN
  Administered 2024-01-16 (×2): 1 mg via INTRAVENOUS

## 2024-01-16 MED ORDER — OXYCODONE HCL 10 MG PO TABS: 10.0000 mg | ORAL_TABLET | ORAL | 0 refills | Status: DC | PRN

## 2024-01-16 MED ORDER — ONDANSETRON HCL 4 MG/2ML IJ SOLN
INTRAMUSCULAR | Status: AC
  Filled 2024-01-16: qty 2

## 2024-01-16 MED ORDER — BUPIVACAINE HCL (PF) 0.5 % IJ SOLN
INTRAMUSCULAR | Status: DC | PRN
  Administered 2024-01-16: 10 mL

## 2024-01-16 MED ORDER — EPINEPHRINE PF 1 MG/ML IJ SOLN
INTRAMUSCULAR | Status: AC
  Filled 2024-01-16: qty 1

## 2024-01-16 MED ORDER — PROPOFOL 10 MG/ML IV BOLUS
INTRAVENOUS | Status: DC | PRN
  Administered 2024-01-16: 200 mg via INTRAVENOUS

## 2024-01-16 MED ORDER — OXYCODONE HCL 5 MG/5ML PO SOLN: 5.0000 mg | Freq: Once | ORAL | Status: DC | PRN

## 2024-01-16 SURGICAL SUPPLY — 51 items
ANCHOR BONE REGENETEN (Anchor) IMPLANT
ANCHOR HEALICOIL REGEN 5.5 (Anchor) IMPLANT
ANCHOR JUGGERKNOT WTAP NDL 2.9 (Anchor) IMPLANT
ANCHOR QFIX 1.8 SUT TAPE BLUE (Anchor) IMPLANT
ANCHOR QFIX 2.8 SUT MINI TAPE (Anchor) IMPLANT
ANCHOR QFIX KTLS 1.8 MINI BLUE (Anchor) IMPLANT
ANCHOR TENDON REGENETEN (Staple) IMPLANT
BIT DRILL JUGRKNT W/NDL BIT2.9 (DRILL) IMPLANT
BLADE FULL RADIUS 3.5 (BLADE) ×1 IMPLANT
BUR ACROMIONIZER 4.0 (BURR) ×1 IMPLANT
CHLORAPREP W/TINT 26 (MISCELLANEOUS) ×1 IMPLANT
COVER MAYO STAND STRL (DRAPES) ×1 IMPLANT
DILATOR 5.5 THREADED HEALICOIL (MISCELLANEOUS) IMPLANT
DRILL JUGGERKNOT W/NDL BIT 2.9 (DRILL) ×1 IMPLANT
ELECT CAUTERY BLADE 6.4 (BLADE) ×1 IMPLANT
ELECT REM PT RETURN 9FT ADLT (ELECTROSURGICAL) ×1 IMPLANT
ELECTRODE REM PT RTRN 9FT ADLT (ELECTROSURGICAL) ×1 IMPLANT
GAUZE SPONGE 4X4 12PLY STRL (GAUZE/BANDAGES/DRESSINGS) ×1 IMPLANT
GAUZE XEROFORM 1X8 LF (GAUZE/BANDAGES/DRESSINGS) ×1 IMPLANT
GLOVE BIO SURGEON STRL SZ7.5 (GLOVE) ×2 IMPLANT
GLOVE BIO SURGEON STRL SZ8 (GLOVE) ×2 IMPLANT
GLOVE BIOGEL PI IND STRL 8 (GLOVE) ×1 IMPLANT
GLOVE INDICATOR 8.0 STRL GRN (GLOVE) ×1 IMPLANT
GOWN STRL REUS W/ TWL LRG LVL3 (GOWN DISPOSABLE) ×1 IMPLANT
GOWN STRL REUS W/ TWL XL LVL3 (GOWN DISPOSABLE) ×1 IMPLANT
GRASPER SUT 15 45D LOW PRO (SUTURE) IMPLANT
IMPL REGENETEN MEDIUM (Shoulder) IMPLANT
IMPLANT REGENETEN MEDIUM (Shoulder) ×1 IMPLANT
IV LR IRRIG 3000ML ARTHROMATIC (IV SOLUTION) ×1 IMPLANT
KIT CANNULA 8X76-LX IN CANNULA (CANNULA) ×1 IMPLANT
KIT SUTURE 2.8 Q-FIX DISP (MISCELLANEOUS) IMPLANT
LASSO 45 DEG LEFT QUICKPASS (SUTURE) IMPLANT
MANIFOLD NEPTUNE II (INSTRUMENTS) ×1 IMPLANT
MASK FACE SPIDER DISP (MASK) ×1 IMPLANT
MAT ABSORB FLUID 56X50 GRAY (MISCELLANEOUS) ×1 IMPLANT
PACK ARTHROSCOPY SHOULDER (MISCELLANEOUS) ×1 IMPLANT
PAD ABD DERMACEA PRESS 5X9 (GAUZE/BANDAGES/DRESSINGS) ×2 IMPLANT
PASSER SUT FIRSTPASS SELF (INSTRUMENTS) IMPLANT
SLING ULTRA II LG (MISCELLANEOUS) ×1 IMPLANT
SPONGE T-LAP 18X18 ~~LOC~~+RFID (SPONGE) ×1 IMPLANT
STAPLER SKIN PROX 35W (STAPLE) ×1 IMPLANT
STRAP SAFETY 5IN WIDE (MISCELLANEOUS) ×1 IMPLANT
SUT ETHIBOND 0 MO6 C/R (SUTURE) ×1 IMPLANT
SUT ULTRABRAID 2 COBRAID 38 (SUTURE) IMPLANT
SUT VIC AB 2-0 CT1 TAPERPNT 27 (SUTURE) ×2 IMPLANT
TAPE MICROFOAM 4IN (TAPE) ×1 IMPLANT
TRAP FLUID SMOKE EVACUATOR (MISCELLANEOUS) ×1 IMPLANT
TUBE SET DOUBLEFLO INFLOW (TUBING) ×1 IMPLANT
TUBING CONNECTING 10 (TUBING) ×1 IMPLANT
WAND WEREWOLF FLOW 90D (MISCELLANEOUS) ×1 IMPLANT
WATER STERILE IRR 500ML POUR (IV SOLUTION) ×1 IMPLANT
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
## 2024-01-16 NOTE — Transfer of Care (Signed)
 Immediate Anesthesia Transfer of Care Note  Patient: William Chase  Procedure(s) Performed: ARTHROSCOPY, SHOULDER WITH DEBRIDEMENT (Right: Shoulder)  Patient Location: PACU  Anesthesia Type:General  Level of Consciousness: awake, drowsy, and patient cooperative  Airway & Oxygen Therapy: Patient Spontanous Breathing and Patient connected to face mask oxygen  Post-op Assessment: Report given to RN and Post -op Vital signs reviewed and stable  Post vital signs: Reviewed and stable  Last Vitals:  Vitals Value Taken Time  BP 100/73 01/16/24 1651  Temp    Pulse 78 01/16/24 1656  Resp 18 01/16/24 1656  SpO2 95 % 01/16/24 1656  Vitals shown include unfiled device data.  Last Pain:  Vitals:   01/16/24 1328  PainSc: 0-No pain         Complications: No notable events documented.

## 2024-01-16 NOTE — Op Note (Signed)
 01/16/2024  5:06 PM  Patient:   William Chase  Pre-Op Diagnosis:   Impingement/tendinopathy with recurrent rotator cuff tear, right shoulder.  Post-Op Diagnosis:   Impingement/tendinopathy with recurrent full-thickness rotator cuff tear, Type II SLAP tear, and biceps tendinopathy, right shoulder.  Procedure:   Limited arthroscopic debridement, arthroscopic SLAP repair, arthroscopic subacromial decompression, mini-open rotator cuff repair supplemented with a Smith & Nephew Regeneten patch, and mini-open biceps tenodesis, right shoulder.  Anesthesia:   General endotracheal with interscalene block using Exparel placed preoperatively by the anesthesiologist.  Surgeon:   Maryagnes Amos, MD  Assistant:   Haynes Hoehn, RNFA  Findings:   As above. There was a full-thickness tear involving the middle and posterior portions of the supraspinatus tendon extending into the anterior portion of the infraspinatus tendon. The remaining portions of the rotator cuff all were in satisfactory condition. There was a type II SLAP tear extending from the 10:30 to the 1 o'clock position. There was moderate "lip sticking" of the biceps tendon without partial or full-thickness tearing. There were grade 1 chondromalacial changes involving the central glenoid and humeral surfaces.  Complications:   None  Estimated blood loss:   15 cc  Fluids:   400 cc  Tourniquet time:   None  Drains:   None  Closure:   Staples      Brief clinical note:   The patient is a 58 year old male with a 6-8 month history of right shoulder pain and limited motion. The patient is status post a prior rotator cuff repair approximately 12 to 15 years ago from which he had done well until the onset of these symptoms. The patient's symptoms have progressed despite medications, activity modification, etc. The patient's history and examination are consistent with impingement/tendinopathy with a recurrent right rotator cuff tear. These  findings were confirmed by MRI scan. The patient presents at this time for definitive management of these shoulder symptoms.  Procedure:   The patient underwent placement of an interscalene block using Exparel by the anesthesiologist in the preoperative holding area before being brought into the operating room and lain in the supine position. The patient then underwent general endotracheal intubation and anesthesia before being repositioned in the beach chair position using the beach chair positioner. The right shoulder and upper extremity were prepped with ChloraPrep solution before being draped sterilely. Preoperative antibiotics were administered. A timeout was performed to confirm the proper surgical site before the expected portal sites and incision site were injected with 0.5% Sensorcaine with epinephrine.   A posterior portal was created and the glenohumeral joint thoroughly inspected with the findings as described above. An anterior portal was created using an outside-in technique. The labrum and rotator cuff were further probed, again confirming the above-noted findings. The areas of labral fraying were debrided back to stable margins using the full-radius resector, as were areas of synovitis. In addition, the torn margins of the rotator cuff also were debrided back to stable margins using the full-radius resector. The ArthroCare wand was inserted and used to release the biceps tendon from his labral anchor. It also was used to obtain hemostasis as well as to "anneal" the labrum superiorly and anteriorly.   Probing of the labrum demonstrated an unstable type II SLAP tear. Therefore, it was elected to repair/stabilize this labral tear. A separate superolateral working portal was created using an outside in technique. The exposed glenoid rim was roughened with a full-radius resector before it was repaired using a Smith & Nephew 1.8 mm  Q-Fix Knotless anchor at the 12 o'clock position. Subsequent probing  of the repair demonstrated excellent stability. The instruments were removed from the joint after suctioning the excess fluid.  The camera was repositioned through the posterior portal into the subacromial space. A separate lateral portal was created using an outside-in technique. The 3.5 mm full-radius resector was introduced and used to perform a subtotal bursectomy. The ArthroCare wand was then inserted and used to remove the periosteal tissue off the undersurface of the anterior third of the acromion as well as to recess the coracoacromial ligament from its attachment along the anterior and lateral margins of the acromion. The 4.0 mm acromionizing bur was introduced and used to complete the decompression by removing the undersurface of the anterior third of the acromion. The full radius resector was reintroduced to remove any residual bony debris before the ArthroCare wand was reintroduced to obtain hemostasis. The instruments were then removed from the subacromial space after suctioning the excess fluid.  An approximately 4-5 cm incision was made over the anterolateral aspect of the shoulder beginning at the anterolateral corner of the acromion and extending distally in line with the bicipital groove. This incision was carried down through the subcutaneous tissues to expose the deltoid fascia. The raphae between the anterior and middle thirds was identified and this plane developed to provide access into the subacromial space. Additional bursal tissues were debrided sharply using Metzenbaum scissors. The rotator cuff tear was readily identified.   The bicipital groove was identified by palpation and opened for 1-1.5 cm. The biceps tendon stump was retrieved through this defect. The floor of the bicipital groove was roughened with a curet before a Biomet 2.9 mm JuggerKnot anchor was inserted. Both sets of sutures were passed through the biceps tendon and tied securely to effect the tenodesis. The bicipital  sheath was reapproximated using two #0 Ethibond interrupted sutures, incorporating the biceps tendon to further reinforce the tenodesis.  The margins of the rotator cuff tear were debrided sharply with a #15 blade and the exposed greater tuberosity roughened with a rongeur. The tear was repaired using two Smith & Nephew 2.8 mm Q-Fix anchors. These sutures were then brought back laterally and secured using two Smith & Nephew Healicoil knotless RegeneSorb anchors to create a two-layer closure. An apparent watertight closure was obtained.  Given the fact that this was a recurrent tear and the tissue quality was somewhat questionable, it was elected to reinforce this repair using a Smith & Nephew Regeneten patch.  The medium sized patch was selected and secured over the repair using the appropriate bone and soft tissue staples.  This procedure contributed an additional measure of complexity to the case, as well as adding another 20 to 30 minutes of surgical time to the procedure.  The wound was copiously irrigated with sterile saline solution before the deltoid raphae was reapproximated using 2-0 Vicryl interrupted sutures. The subcutaneous tissues were closed in two layers using 2-0 Vicryl interrupted sutures before the skin was closed using staples. The portal sites also were closed using staples. A sterile bulky dressing was applied to the shoulder before the arm was placed into a shoulder immobilizer. The patient was then awakened, extubated, and returned to the recovery room in satisfactory condition after tolerating the procedure well.

## 2024-01-16 NOTE — Anesthesia Procedure Notes (Signed)
 Anesthesia Regional Block: Interscalene brachial plexus block   Pre-Anesthetic Checklist: , timeout performed,  Correct Patient, Correct Site, Correct Laterality,  Correct Procedure, Correct Position, site marked,  Risks and benefits discussed,  Surgical consent,  Pre-op evaluation,  At surgeon's request and post-op pain management  Laterality: Right  Prep: chloraprep       Needles:  Injection technique: Single-shot  Needle Type: Echogenic Needle     Needle Length: 4cm  Needle Gauge: 25     Additional Needles:   Procedures:,,,, ultrasound used (permanent image in chart),,    Narrative:  Start time: 01/16/2024 1:32 PM End time: 01/16/2024 1:34 PM Injection made incrementally with aspirations every 5 mL.  Performed by: Personally  Anesthesiologist: Stephanie Coup, MD  Additional Notes: Patient's chart reviewed and they were deemed appropriate candidate for procedure, at surgeon's request. Patient educated about risks, benefits, and alternatives of the block including but not limited to: temporary or permanent nerve damage, bleeding, infection, damage to surround tissues, pneumothorax, hemidiaphragmatic paralysis, unilateral Horner's syndrome, block failure, local anesthetic toxicity. Patient expressed understanding. A formal time-out was conducted consistent with institution rules.  Monitors were applied, and minimal sedation used (see nursing record). The site was prepped with skin prep and allowed to dry, and sterile gloves were used. A high frequency linear ultrasound probe with probe cover was utilized throughout. C5-7 nerve roots located and appeared anatomically normal, local anesthetic injected around them, and echogenic block needle trajectory was monitored throughout. Aspiration performed every 5ml. Lung and blood vessels were avoided. All injections were performed without resistance and free of blood and paresthesias. The patient tolerated the procedure well.  Injectate:  20ml exparel + 10ml 0.5% bupivacaine

## 2024-01-16 NOTE — H&P (Signed)
 History of Present Illness: William Chase is a 58 y.o. male who presents today for history and physical for a right shoulder arthroscopy with debridement, decompression, repair of recurrent rotator cuff tear and biceps tenodesis by Dr. Joice Lofts on 01/16/2024. Patient has had 5 to 7 months of severe right shoulder pain with limitations in abduction and flexion. No known trauma or injury. He had a prior arthroscopic rotator cuff repair 12 to 15 years ago from which he did well with until his recent symptoms began over the last 6 months. He had MRI of his right shoulder showing large rotator cuff tear with retraction. He has had pain that he describes as aching, nagging and throbbing along the lateral deltoid that radiates to the elbow. His pain is severe, 10 out of 10 and increased with normal activities daily living such as lifting objects, washing his hair, putting cups and cabinets as well as with sleeping. He has tried Tylenol, Voltaren gel, meloxicam as well as narcotics and muscle relaxers with little relief. He is right-hand dominant. Denies any numbness tingling radicular symptoms.  Past Medical History: No past medical history on file.  Past Surgical History: COLONOSCOPY 2011 (none per patient)  COLONOSCOPY 10/06/2021 (Hyperplastic polyp/Repeat 58yrs/TKT)   Past Family History: No family history on file.  Medications: amoxicillin-clavulanate (AUGMENTIN) 875-125 mg tablet Take 1 tablet by mouth 2 (two) times daily  biotin 100 mg/gram Powd Take by mouth  buPROPion (WELLBUTRIN SR) 150 MG SR tablet 3 tablets per day in the morning  chlorhexidine (HIBICLENS) 4 % external wash Apply topically  cholecalciferol, vitamin D3, (VITAMIN D3) 125 mcg (5,000 unit) tablet Take by mouth  collagenase (SANTYL) ointment Apply topically  cyanocobalamin (VITAMIN B12) 1,000 mcg/mL injection Inject into the muscle  cyclobenzaprine (FLEXERIL) 10 MG tablet Take by mouth  dextromethorphan-guaifenesin 60-1,200 mg per  12 hr tablet Take by mouth  diazePAM (VALIUM) 10 MG tablet Take by mouth  diclofenac (VOLTAREN) 1 % topical gel Apply topically  fluocinolone acetonide (DERMOTIC) 0.01 % otic drop Place in ear(s)  fluticasone propion-salmeteroL (ADVAIR DISKUS) 100-50 mcg/dose diskus inhaler INHALE 1 PUFF INTO THE LUNGS TWICE DAILY rinse mouth  HYDROcodone-acetaminophen (NORCO) 5-325 mg tablet Take 1 tablet by mouth 2 (two) times daily as needed for Pain 30 tablet 0  hydrocortisone 2.5 % lotion Apply topically  ketoconazole (NIZORAL) 2 % shampoo Apply topically  lactulose (ENULOSE) 10 gram/15 mL oral solution Take by mouth  lisinopriL-hydrochlorothiazide (ZESTORETIC) 10-12.5 mg tablet Take by mouth  meloxicam (MOBIC) 7.5 MG tablet TAKE 1 TABLET BY MOUTH EVERY DAY 30 tablet 1  methylphenidate HCl (RITALIN) 10 MG tablet  metroNIDAZOLE (FLAGYL) 500 MG tablet Take by mouth  multivitamin tablet Take 1 tablet by mouth once daily  mupirocin (BACTROBAN) 2 % ointment Apply topically  needle, disp, 25 gauge (BD PRECISIONGLIDE) 25 gauge x 1" Ndle Use  oxymetazoline (AFRIN) 0.05 % nasal spray Place 1 spray into one nostril  QUEtiapine (SEROQUEL) 400 MG tablet  sodium, potassium, and magnesium (SUPREP) oral solution Take 2 Bottles (1 kit total) by mouth as directed One kit contains 2 bottles. Take both bottles at the times instructed by your provider. 354 mL 0  traZODone (DESYREL) 100 MG tablet TAKE 1 TABLET BY MOUTH AT BEDTIME MAY REPEAT   Allergies: Iodine And Iodide Containing Products Anaphylaxis   Review of Systems:  A comprehensive 14 point ROS was performed, reviewed by me today, and the pertinent orthopaedic findings are documented in the HPI.  Physical Exam: BP 118/62  Ht 167.6 cm (5\' 6" )  Wt 86.6 kg (191 lb)  BMI 30.83 kg/m  General:  Well developed, well nourished, no apparent distress, normal affect, normal gait with no antalgic component.   HEENT: Head normocephalic, atraumatic, PERRL.    Abdomen: Soft, non tender, non distended, Bowel sounds present.  Heart: Examination of the heart reveals regular, rate, and rhythm. There is no murmur noted on ascultation. There is a normal apical pulse.  Lungs: Lungs are clear to auscultation. There is no wheeze, rhonchi, or crackles. There is normal expansion of bilateral chest walls.   Right shoulder: Previous incision sites are healed, no significant swelling warmth redness. Tender along the lateral acromion. He has 145 degrees of flexion, abduction 90 degrees. Internal rotation L1. Passive range of motion, forward flexion 155 degrees, abduction 150 degrees, and 60 degrees of external rotation. He has pain with flexion and abduction  Imaging: Right Shoulder MRI: MRI Shoulder Cartilage: No cartilage abnormality. MRI Shoulder Rotator Cuff: Full thickness tear of the supraspinatus. Retracted to the humeral head. MRI Shoulder Labrum / Biceps: Biceps tendinopathy. MRI Shoulder Bone: Normal bone.  Both the films and report were reviewed by myself and discussed with the patient and his ex-wife.   Impression: 1. Nontraumatic complete tear of right rotator cuff. 2. Chronic right shoulder pain.  Plan:  The treatment options were discussed with the patient and his ex-wife. In addition, patient educational materials were provided regarding the diagnosis and treatment options. The patient is quite frustrated by his symptoms and functional limitations, and is ready to consider more aggressive treatment options. Therefore, I have recommended a surgical procedure, specifically a right shoulder arthroscopy with debridement, decompression, repair of his recurrent rotator cuff tear, and probable biceps tenodesis. The procedure was discussed with the patient, as were the potential risks (including bleeding, infection, nerve and/or blood vessel injury, persistent or recurrent pain, failure of the repair, progression of arthritis, need for further  surgery, blood clots, strokes, heart attacks and/or arhythmias, pneumonia, etc.) and benefits. The patient states his understanding and wishes to proceed. All of the patient's questions and concerns were answered. He can call any time with further concerns. He will follow up post-surgery, routine.     H&P reviewed and patient re-examined. No changes.

## 2024-01-16 NOTE — Anesthesia Procedure Notes (Signed)
 Procedure Name: Intubation Date/Time: 01/16/2024 2:38 PM  Performed by: Mohammed Kindle, CRNAPre-anesthesia Checklist: Patient identified, Emergency Drugs available, Suction available and Patient being monitored Patient Re-evaluated:Patient Re-evaluated prior to induction Oxygen Delivery Method: Circle system utilized Preoxygenation: Pre-oxygenation with 100% oxygen Induction Type: IV induction Ventilation: Mask ventilation without difficulty Laryngoscope Size: McGrath and 3 Grade View: Grade I Tube type: Oral Number of attempts: 1 Airway Equipment and Method: Stylet Placement Confirmation: ETT inserted through vocal cords under direct vision, positive ETCO2 and breath sounds checked- equal and bilateral Secured at: 21 cm Tube secured with: Tape Dental Injury: Teeth and Oropharynx as per pre-operative assessment

## 2024-01-16 NOTE — Discharge Instructions (Addendum)
 Orthopedic discharge instructions: Keep dressing dry and intact.  May shower after dressing changed on post-op day #4 (Saturday).  Cover staples/sutures with Band-Aids after drying off. Apply ice frequently to shoulder. Resume Mobic 15 mg daily with food OR take ibuprofen 600-800 mg TID with meals for 3-5 days, then as necessary. Take oxycodone as prescribed when needed.  May supplement with ES Tylenol if necessary. Keep shoulder immobilizer on at all times except may remove for bathing purposes. Follow-up in 10-14 days or as scheduled.  SHOULDER SLING IMMOBILIZER   VIDEO Slingshot 2 Shoulder Brace Application - YouTube ---[email or web address]://[email or web address].porter.info/  INSTRUCTIONS While supporting the injured arm, slide the forearm into the sling. Wrap the adjustable shoulder strap around the neck and shoulders and attach the strap end to the sling using  the "alligator strap tab."  Adjust the shoulder strap to the required length. Position the shoulder pad behind the neck. To secure the shoulder pad location (optional), pull the shoulder strap away from the shoulder pad, unfold the hook material on the top of the pad, then press the shoulder strap back onto the hook material to secure the pad in place. Attach the closure strap across the open top of the sling. Position the strap so that it holds the arm securely in the sling. Next, attach the thumb strap to the open end of the sling between the thumb and fingers. After sling has been fit, it may be easily removed and reapplied using the quick release buckle on shoulder strap. If a neutral pillow or 15 abduction pillow is included, place the pillow at the waistline. Attach the sling to the pillow, lining up hook material on the pillow with the loop on sling. Adjust the waist strap to fit.  If waist strap is too long, cut it to fit. Use the small piece of double sided hook material (located on top of the pillow) to secure the strap end. Place  the double sided hook material on the inside of the cut strap end and secure it to the waist strap.     If no pillow is included, attach the waist strap to the sling and adjust to fit.    Washing Instructions: Straps and sling must be removed and cleaned regularly depending on your activity level and perspiration. Hand wash straps and sling in cold water with mild detergent, rinse, air dry

## 2024-01-17 NOTE — Anesthesia Postprocedure Evaluation (Signed)
 Anesthesia Post Note  Patient: William Chase  Procedure(s) Performed: ARTHROSCOPY, SHOULDER WITH DEBRIDEMENT (Right: Shoulder)  Patient location during evaluation: PACU Anesthesia Type: General Level of consciousness: awake and alert Pain management: pain level controlled Vital Signs Assessment: post-procedure vital signs reviewed and stable Respiratory status: spontaneous breathing, nonlabored ventilation, respiratory function stable and patient connected to nasal cannula oxygen Cardiovascular status: blood pressure returned to baseline and stable Postop Assessment: no apparent nausea or vomiting Anesthetic complications: no   No notable events documented.   Last Vitals:  Vitals:   01/16/24 1741 01/16/24 1815  BP: (!) 100/51 99/60  Pulse: 74 74  Resp: 16   Temp:    SpO2:  98%    Last Pain:  Vitals:   01/16/24 1734  TempSrc: Temporal  PainSc: 0-No pain                 Yevette Edwards

## 2024-01-18 ENCOUNTER — Encounter: Payer: Self-pay | Admitting: Surgery

## 2024-01-25 ENCOUNTER — Encounter

## 2024-01-25 DIAGNOSIS — R7303 Prediabetes: Secondary | ICD-10-CM

## 2024-01-25 DIAGNOSIS — I10 Essential (primary) hypertension: Secondary | ICD-10-CM

## 2024-01-25 DIAGNOSIS — Z125 Encounter for screening for malignant neoplasm of prostate: Secondary | ICD-10-CM

## 2024-01-25 DIAGNOSIS — E785 Hyperlipidemia, unspecified: Secondary | ICD-10-CM

## 2024-01-25 NOTE — Progress Notes (Deleted)
   Established Patient Office Visit  Subjective   Patient ID: William Chase, male    DOB: 1965-11-14  Age: 58 y.o. MRN: [id number]  No chief complaint on file.  Previously established patient of Dr. Birdie Sons presenting for transition of care.  Patient was last seen in the clinic on 09/12/2024 by Dr. Birdie Sons.  1)  Right leg weakness: At the visit patient was concerned about right leg weakness for which MRI brain was done to rule out TIA.  MRI brain from 09/16/2023 reviewed which was negative for acute intracranial abnormality but did show potential small vessel changes.  Patient was counseled on getting carotid Doppler, echo to rule out suspected TIA induced right leg weakness.  - Symptoms now? - Echo with bubble, optimization of BP, lipid panel, weight loss, alchohol in take etc?   2) H/O anxiety and depression: - Patient following up with psychiatry.  - Currently on trazodone ** mg.  - What other medications? SI/HI? SA  3) Chronic right shoulder pain - Underwent right shoulder arthroscopy with debridement, compression completed rotator cuff tear on 01/16/2024 with Dr. Leron Croak. Following with ortho.  - During his visit with Dr. Birdie Sons he was started on meloxicam 15 mg for pain control.  - Current medications, symptoms?   4) Chronic left knee pain - Referral to ortho was done during his last visit with Dr. Birdie Sons.  Patient also had x-ray of left knee on 09/13/2023 which negative for acute findings but showed noted deformity of tibia and fibula.   5) Hyperlipidemia:  - Currently taking simvastatin 40 mg daily.  -Last lipid panel from 11/22/2022 reviewed which showed LDL of 70.   6) HbA1c from 11/22/22, 5.4%  7) Vitamin D insufficiency, 11/22/22. Needs repeat lab. Taking Vitamin D supplements?   8) Borderline Vitamin B 12 deficiency: Lab from 11/22/22 showed 223. Taking OTC supplement? Was recommended to start B12 injection if patient was interested.   9) Hypertension:  On Lisinopril-hydrochlorothiazide   10) Chronic constipation: On Lubiprostone      HPI  {History (Optional):23778}  ROS    Objective:     There were no vitals taken for this visit. {Vitals History (Optional):23777}  Physical Exam   No results found for any visits on 01/25/24.  {Labs (Optional):23779}  The 10-year ASCVD risk score (Arnett DK, et al., 2019) is: 3.2%    Assessment & Plan:   Problem List Items Addressed This Visit     Prediabetes (Chronic)   Dyslipidemia (Chronic)   HTN (hypertension) (Chronic)   Other Visit Diagnoses       Essential hypertension         Hyperlipidemia, unspecified hyperlipidemia type         Prostate cancer screening           No follow-ups on file.    Skip Estimable, MD

## 2024-01-26 ENCOUNTER — Encounter: Payer: Self-pay | Admitting: Surgery

## 2024-02-14 ENCOUNTER — Telehealth: Payer: Self-pay

## 2024-02-14 NOTE — Telephone Encounter (Signed)
 Copied from CRM [id number]. Topic: General - Other >> Feb 14, 2024  3:54 PM Emylou G wrote: Reason for CRM: Pls call patient: [phone] needs to schedule new patient?  He missed his due to surgery?  Does he still come to annual?

## 2024-02-14 NOTE — Telephone Encounter (Signed)
 error

## 2024-02-19 ENCOUNTER — Ambulatory Visit

## 2024-02-21 ENCOUNTER — Ambulatory Visit: Payer: Medicare Other | Admitting: *Deleted

## 2024-02-21 VITALS — Ht 68.0 in | Wt 174.0 lb

## 2024-02-21 DIAGNOSIS — Z0000 Encounter for general adult medical examination without abnormal findings: Secondary | ICD-10-CM
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
## 2024-02-21 NOTE — Patient Instructions (Signed)
 William Chase , Thank you for taking time to come for your Medicare Wellness Visit. I appreciate your ongoing commitment to your health goals. Please review the following plan we discussed and let me know if I can assist you in the future.   Referrals/Orders/Follow-Ups/Clinician Recommendations: Remember to get an annual flu vaccine.  This is a list of the screening recommended for you and due dates:  Health Maintenance  Topic Date Due   COVID-19 Vaccine (4 - 2024-25 season) 06/25/2023   Flu Shot  05/24/2024   Medicare Annual Wellness Visit  02/20/2025   DTaP/Tdap/Td vaccine (4 - Td or Tdap) 06/04/2031   Colon Cancer Screening  10/07/2031   Hepatitis C Screening  Completed   HIV Screening  Completed   Zoster (Shingles) Vaccine  Completed   Pneumococcal Vaccination  Aged Out   HPV Vaccine  Aged Out   Meningitis B Vaccine  Aged Out    Advanced directives: (Declined) Advance directive discussed with you today. Even though you declined this today, please call our office should you change your mind, and we can give you the proper paperwork for you to fill out.  Next Medicare Annual Wellness Visit scheduled for next year: Yes 02/25/25 @ 3:00  Managing Pain Without Opioids Opioids are strong medicines used to treat moderate to severe pain. For some people, especially those who have long-term (chronic) pain, opioids may not be the best choice for pain management due to: Side effects like nausea, constipation, and sleepiness. The risk of addiction (opioid use disorder). The longer you take opioids, the greater your risk of addiction. Pain that lasts for more than 3 months is called chronic pain. Managing chronic pain usually requires more than one approach and is often provided by a team of health care providers working together (multidisciplinary approach). Pain management may be done at a pain management center or pain clinic. How to manage pain without the use of opioids Use non-opioid  medicines Non-opioid medicines for pain may include: Over-the-counter or prescription non-steroidal anti-inflammatory drugs (NSAIDs). These may be the first medicines used for pain. They work well for muscle and bone pain, and they reduce swelling. Acetaminophen . This over-the-counter medicine may work well for milder pain but not swelling. Antidepressants. These may be used to treat chronic pain. A certain type of antidepressant (tricyclics) is often used. These medicines are given in lower doses for pain than when used for depression. Anticonvulsants. These are usually used to treat seizures but may also reduce nerve (neuropathic) pain. Muscle relaxants. These relieve pain caused by sudden muscle tightening (spasms). You may also use a pain medicine that is applied to the skin as a patch, cream, or gel (topical analgesic), such as a numbing medicine. These may cause fewer side effects than medicines taken by mouth. Do certain therapies as directed Some therapies can help with pain management. They include: Physical therapy. You will do exercises to gain strength and flexibility. A physical therapist may teach you exercises to move and stretch parts of your body that are weak, stiff, or painful. You can learn these exercises at physical therapy visits and practice them at home. Physical therapy may also involve: Massage. Heat wraps or applying heat or cold to affected areas. Electrical signals that interrupt pain signals (transcutaneous electrical nerve stimulation, TENS). Weak lasers that reduce pain and swelling (low-level laser therapy). Signals from your body that help you learn to regulate pain (biofeedback). Occupational therapy. This helps you to learn ways to function at home and  work with less pain. Recreational therapy. This involves trying new activities or hobbies, such as a physical activity or drawing. Mental health therapy, including: Cognitive behavioral therapy (CBT). This helps  you learn coping skills for dealing with pain. Acceptance and commitment therapy (ACT) to change the way you think and react to pain. Relaxation therapies, including muscle relaxation exercises and mindfulness-based stress reduction. Pain management counseling. This may be individual, family, or group counseling.  Receive medical treatments Medical treatments for pain management include: Nerve block injections. These may include a pain blocker and anti-inflammatory medicines. You may have injections: Near the spine to relieve chronic back or neck pain. Into joints to relieve back or joint pain. Into nerve areas that supply a painful area to relieve body pain. Into muscles (trigger point injections) to relieve some painful muscle conditions. A medical device placed near your spine to help block pain signals and relieve nerve pain or chronic back pain (spinal cord stimulation device). Acupuncture. Follow these instructions at home Medicines Take over-the-counter and prescription medicines only as told by your health care provider. If you are taking pain medicine, ask your health care providers about possible side effects to watch out for. Do not drive or use heavy machinery while taking prescription opioid pain medicine. Lifestyle  Do not use drugs or alcohol to reduce pain. If you drink alcohol, limit how much you have to: 0-1 drink a day for women who are not pregnant. 0-2 drinks a day for men. Know how much alcohol is in a drink. In the [place] one drink equals one 12 oz bottle of beer (355 mL), one 5 oz glass of wine (148 mL), or one 1 oz glass of hard liquor (44 mL). Do not use any products that contain nicotine or tobacco. These products include cigarettes, chewing tobacco, and vaping devices, such as e-cigarettes. If you need help quitting, ask your health care provider. Eat a healthy diet and maintain a healthy weight. Poor diet and excess weight may make pain worse. Eat foods that  are high in fiber. These include fresh fruits and vegetables, whole grains, and beans. Limit foods that are high in fat and processed sugars, such as fried and sweet foods. Exercise regularly. Exercise lowers stress and may help relieve pain. Ask your health care provider what activities and exercises are safe for you. If your health care provider approves, join an exercise class that combines movement and stress reduction. Examples include yoga and tai chi. Get enough sleep. Lack of sleep may make pain worse. Lower stress as much as possible. Practice stress reduction techniques as told by your therapist. General instructions Work with all your pain management providers to find the treatments that work best for you. You are an important member of your pain management team. There are many things you can do to reduce pain on your own. Consider joining an online or in-person support group for people who have chronic pain. Keep all follow-up visits. This is important. Where to find more information You can find more information about managing pain without opioids from: American Academy of Pain Medicine: painmed.org Institute for Chronic Pain: instituteforchronicpain.org American Chronic Pain Association: theacpa.org Contact a health care provider if: You have side effects from pain medicine. Your pain gets worse or does not get better with treatments or home therapy. You are struggling with anxiety or depression. Summary Many types of pain can be managed without opioids. Chronic pain may respond better to pain management without opioids. Pain is best  managed when you and a team of health care providers work together. Pain management without opioids may include non-opioid medicines, medical treatments, physical therapy, mental health therapy, and lifestyle changes. Tell your health care providers if your pain gets worse or is not being managed well enough. This information is not intended to  replace advice given to you by your health care provider. Make sure you discuss any questions you have with your health care provider. Document Revised: 01/20/2021 Document Reviewed: 01/20/2021  Elsevier Patient Education  2024 [employer].

## 2024-03-01 NOTE — Telephone Encounter (Signed)
 Patient is scheduled for TOC appt on 10/31/24 with Dr Casimir Cleaver.

## 2024-04-10 ENCOUNTER — Ambulatory Visit: Payer: Self-pay

## 2024-04-10 NOTE — Telephone Encounter (Signed)
 Attempted to contact patient 3 times with no answer. Voice mail left for patient to call back. Routing to clinic.    Summary: Big Boil on Stomach   The patient called in stating he has a big boil on his stomach that is very uncomfortable. He is currently at [facility] at an appt but he would like a call back by 4 if possible to speak with a nurse. Please assist patient further

## 2024-04-10 NOTE — Telephone Encounter (Signed)
 Patient called, left VM to return the call to speak to NT.      Summary: William Chase on Stomach   The patient called in stating he has a William Chase on his stomach that is very uncomfortable. He is currently at [facility] at an appt but he would like a call back by 4 if possible to speak with a nurse. Please assist patient further

## 2024-04-11 NOTE — Telephone Encounter (Signed)
 Called patient to follow up with telephone encounter we received. Patient says he was seen by [facility] and was prescribed an antibiotic and was scheduled an follow up with them. Patient was advised to give our office a call back if he needed to be seen Acute before his next scheduled appointment with our office. Patient verbalized understanding.

## 2024-04-19 ENCOUNTER — Other Ambulatory Visit: Payer: Self-pay | Admitting: Nurse Practitioner

## 2024-05-24 ENCOUNTER — Telehealth: Payer: Self-pay

## 2024-05-24 NOTE — Telephone Encounter (Signed)
 Patient is due for medication refill request, but he is scheduled for a TOC 10/2024. Patient would need an Acute visit for Medication Refill.

## 2024-05-29 ENCOUNTER — Ambulatory Visit: Admitting: Internal Medicine

## 2024-05-29 ENCOUNTER — Encounter: Payer: Self-pay | Admitting: Internal Medicine

## 2024-05-29 VITALS — BP 128/78 | HR 97 | Temp 98.2°F | Ht 68.0 in | Wt 175.4 lb

## 2024-05-29 DIAGNOSIS — I10 Essential (primary) hypertension: Secondary | ICD-10-CM

## 2024-05-29 DIAGNOSIS — E559 Vitamin D deficiency, unspecified: Secondary | ICD-10-CM | POA: Diagnosis not present

## 2024-05-29 DIAGNOSIS — R7303 Prediabetes: Secondary | ICD-10-CM

## 2024-05-29 DIAGNOSIS — E538 Deficiency of other specified B group vitamins: Secondary | ICD-10-CM | POA: Diagnosis not present

## 2024-05-29 DIAGNOSIS — G8929 Other chronic pain: Secondary | ICD-10-CM

## 2024-05-29 DIAGNOSIS — I517 Cardiomegaly: Secondary | ICD-10-CM

## 2024-05-29 DIAGNOSIS — E785 Hyperlipidemia, unspecified: Secondary | ICD-10-CM

## 2024-05-29 DIAGNOSIS — R011 Cardiac murmur, unspecified: Secondary | ICD-10-CM

## 2024-05-29 DIAGNOSIS — M25511 Pain in right shoulder: Secondary | ICD-10-CM

## 2024-05-29 DIAGNOSIS — L03113 Cellulitis of right upper limb: Secondary | ICD-10-CM | POA: Insufficient documentation

## 2024-05-29 LAB — COMPREHENSIVE METABOLIC PANEL WITH GFR
ALT: 12 U/L (ref 0–53)
AST: 16 U/L (ref 0–37)
Albumin: 3.8 g/dL (ref 3.5–5.2)
Alkaline Phosphatase: 63 U/L (ref 39–117)
BUN: 19 mg/dL (ref 6–23)
CO2: 29 meq/L (ref 19–32)
Calcium: 8.9 mg/dL (ref 8.4–10.5)
Chloride: 98 meq/L (ref 96–112)
Creatinine, Ser: 1.41 mg/dL (ref 0.40–1.50)
GFR: 56.07 mL/min — ABNORMAL LOW (ref >60.00)
Glucose, Bld: 118 mg/dL — ABNORMAL HIGH (ref 70–99)
Potassium: 3.3 meq/L — ABNORMAL LOW (ref 3.5–5.1)
Sodium: 136 meq/L (ref 135–145)
Total Bilirubin: 1 mg/dL (ref 0.2–1.2)
Total Protein: 7.3 g/dL (ref 6.0–8.3)

## 2024-05-29 LAB — B12 AND FOLATE PANEL
Folate: >23.4 ng/mL (ref >5.9)
Vitamin B-12: 318 pg/mL (ref 211–911)

## 2024-05-29 LAB — VITAMIN D 25 HYDROXY (VIT D DEFICIENCY, FRACTURES): VITD: 31.8 ng/mL (ref 30.00–100.00)

## 2024-05-29 LAB — HEMOGLOBIN A1C: Hgb A1c MFr Bld: 5.5 % (ref 4.6–6.5)

## 2024-05-29 MED ORDER — SIMVASTATIN 40 MG PO TABS: ORAL_TABLET | ORAL | 3 refills | Status: DC

## 2024-05-29 MED ORDER — CEPHALEXIN 500 MG PO CAPS: 500.0000 mg | ORAL_CAPSULE | Freq: Two times a day (BID) | ORAL | 0 refills | Status: DC

## 2024-05-29 MED ORDER — LISINOPRIL-HYDROCHLOROTHIAZIDE 10-12.5 MG PO TABS: ORAL_TABLET | ORAL | 1 refills | Status: DC

## 2024-05-29 MED ORDER — LISINOPRIL-HYDROCHLOROTHIAZIDE 10-12.5 MG PO TABS: ORAL_TABLET | ORAL | 0 refills | Status: DC

## 2024-05-29 NOTE — Assessment & Plan Note (Signed)
-   This problem is chronic and stable -We will continue with oral vitamin D  supplementation -Recheck his vitamin D  level today -No further workup at this time

## 2024-05-29 NOTE — Assessment & Plan Note (Signed)
-   Patient did have an echocardiogram in 2018 which showed severe LVH and systolic anterior motion of the mitral valve -He saw cardiology for this with no further workup recommended -We will recheck 2D echo given that he does have a significant murmur on exam today and has been 7 years since his last echocardiogram -No further workup at this time

## 2024-05-29 NOTE — Assessment & Plan Note (Signed)
-   This problem is chronic and stable -Blood pressure today is at goal at 128/78 -Will continue lisinopril /HCTZ 10/12.5 mg daily -Will check a CMP today -Will refill his blood pressure medication today

## 2024-05-29 NOTE — Patient Instructions (Addendum)
-   It was a pleasure meeting you today -Your blood pressure is well-controlled.  Keep up the great work! - I have refilled your blood pressure medication today (lisinopril /HCTZ) as well as your cholesterol medication -We will check some blood work on you today including your kidney function, liver function, vitamin B12, vitamin D , A1c and cholesterol levels -Your right elbow looks like it may have mild superficial infection of the skin around the elbow.  We will call in an antibiotic for you (Keflex  500 mg twice a day for 7 days) -If the right elbow pain/redness/swelling worsen or you develop fevers or chills or purulent drainage from the area please follow-up with as soon as possible -I have also put in for an ultrasound of your heart to follow-up on your murmur.  Your last ultrasound was done in 2018 -Please contact us  with any questions or concerns or if you need any

## 2024-05-29 NOTE — Assessment & Plan Note (Addendum)
-   Patient states that his dog ran into his elbow and then he subsequently scraped his elbow in the washing machine and noted bleeding from the site -He did see orthopedics for this when it initially occurred and was given Keflex  for 10 days with improvement in his symptoms but patient currently complains of swelling and redness at the site but denies any purulent drainage -On exam, patient has an abrasion over his right elbow with mild bleeding as well as erythema and swelling around the elbow -Patient with likely superficial cellulitis at that site -He has a questionable allergy to doxycycline  (dizziness).  Will treat the patient with Keflex  500 mg twice daily for 7 days -No further workup at this time

## 2024-05-29 NOTE — Progress Notes (Signed)
 Acute Office Visit  Subjective:     Patient ID: William Chase, male    DOB: August 22, 1966, 58 y.o.   MRN: [id number]  Chief Complaint  Patient presents with   Medical Management of Chronic Issues     Discussed the use of AI scribe software for clinical note transcription with the patient, who gave verbal consent to proceed.  History of Present Illness William Chase is a 58 year old male who presents with an elbow injury and pain.  Right elbow pain and swelling - Sustained injury to right elbow last week when his dog ran into it, resulting in swelling - Subsequently scraped the elbow while adjusting items in a washing machine, causing significant bleeding - Elbow is painful, swollen, and erythematous - No purulent drainage, only blood present - Concerned about swelling and redness potentially affecting upcoming shoulder surgery scheduled for June 18, 2024  Right shoulder and upper arm pain - History of rotator cuff surgery on January 16, 2024 - Scheduled for additional surgery due to torn biceps tendon with resulting 'Popeye muscle' deformity - Experiences pain in right shoulder and upper arm musculature - Pain impacts ability to perform certain activities  Hypertension and hyperlipidemia management - Currently taking combination pill of lisinopril  and hydrochlorothiazide  for blood pressure control - Recent blood pressure reading was 128/78 mmHg - Takes Zocor  for cholesterol management  Vitamin b12 deficiency - History of borderline vitamin B12 levels - Advised to take B12 injections every 14 days - Unable to administer injections due to arm pain and use of a sling  Depressive symptoms - Under psychiatric care for depression - Currently taking Seroquel, trazodone, Wellbutrin, Ritalin, and Valium  Constitutional and infectious symptoms - No fever, cough, or cold symptoms - Experiences chills, attributed to weather  Cardiac murmur - History of heart murmur since  childhood, which had resolved but later recurred.  Last echocardiogram was in 2018 which showed severe LVH and moderate systolic anterior motion of the mitral valve       Review of Systems  Constitutional:  Negative for chills and fever.  HENT: Negative.    Respiratory: Negative.    Cardiovascular: Negative.   Gastrointestinal: Negative.   Musculoskeletal:  Positive for joint pain.       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   Psychiatric/Behavioral: Negative.          Objective:    BP 128/78   Pulse 97   Temp 98.2 F (36.8 C)   Ht 5' 8 (1.727 m)   Wt 175 lb 6.4 oz (79.6 kg)   SpO2 97%   BMI 26.67 kg/m    Physical Exam Constitutional:      Appearance: Normal appearance.  HENT:     Head: Normocephalic and atraumatic.  Cardiovascular:     Rate and Rhythm: Normal rate and regular rhythm.     Heart sounds: Murmur heard.     Comments: Grade 3 out of 6 systolic murmur auscultated Pulmonary:     Breath sounds: Normal breath sounds. No wheezing or rales.  Abdominal:     General: Bowel sounds are normal. There is no distension.     Palpations: Abdomen is soft.     Tenderness: There is no abdominal tenderness.  Musculoskeletal:        General: Swelling and tenderness present.  Comments: Patient was well right elbow with surrounding erythema and tenderness to palpation with an abrasion over the elbow.  No purulent drainage noted.  No increased local warmth  Neurological:     Mental Status: He is alert and oriented to person, place, and time.  Psychiatric:        Mood and Affect: Mood normal.        Behavior: Behavior normal.     No results found for any visits on 05/29/24.      Assessment & Plan:   Problem List Items Addressed This Visit       Cardiovascular and Mediastinum   HTN  (hypertension) - Primary (Chronic)   - This problem is chronic and stable -Blood pressure today is at goal at 128/78 -Will continue lisinopril /HCTZ 10/12.5 mg daily -Will check a CMP today -Will refill his blood pressure medication today      Relevant Medications   simvastatin  (ZOCOR ) 40 MG tablet   lisinopril -hydrochlorothiazide  (ZESTORETIC ) 10-12.5 MG tablet   LVH (left ventricular hypertrophy)   - Patient's last echo in 2018 showed systolic anterior motion of the mitral valve as well as severe LVH -He did follow-up with cardiology for this but no follow-up recommended at that time -Will repeat 2D echo since it has been 7 years since his last 1 and consider referral to Dr. Court again if necessary      Relevant Medications   simvastatin  (ZOCOR ) 40 MG tablet   lisinopril -hydrochlorothiazide  (ZESTORETIC ) 10-12.5 MG tablet     Other   Dyslipidemia (Chronic)   - This problem is chronic and stable -We will recheck his lipid panel today -Refilled his simvastatin  40 mg daily -No further workup at this time      Relevant Medications   simvastatin  (ZOCOR ) 40 MG tablet   Prediabetes (Chronic)   - Patient has a history of prediabetes but his last A1c was 5.4 -Will recheck his A1c today -No further workup for now      Relevant Orders   Hemoglobin A1c   Systolic murmur (Chronic)   - Patient did have an echocardiogram in 2018 which showed severe LVH and systolic anterior motion of the mitral valve -He saw cardiology for this with no further workup recommended -We will recheck 2D echo given that he does have a significant murmur on exam today and has been 7 years since his last echocardiogram -No further workup at this time      Relevant Orders   ECHOCARDIOGRAM COMPLETE   B12 deficiency   - This problem is chronic -Patient had borderline vitamin B12 levels at his last visit but was unable to do B12 injections secondary to shoulder issues in his right shoulder -Will recheck  vitamin B12 levels today -Patient is unable to come to the office for B12 injection since it is 45 minutes away -Would consider restarting his B12 shots once he is able to give himself injections      Relevant Orders   B12 and Folate Panel   Cellulitis of right elbow   - Patient states that his dog ran into his elbow and then he subsequently scraped his elbow in the washing machine and noted bleeding from the site -He did see orthopedics for this when it initially occurred and was given Keflex  for 10 days with improvement in his symptoms but patient currently complains of swelling and redness at the site but denies any purulent drainage -On exam, patient has an abrasion over his right elbow with mild bleeding  as well as erythema and swelling around the elbow -Patient with likely superficial cellulitis at that site -He has a questionable allergy to doxycycline  (dizziness).  Will treat the patient with Keflex  500 mg twice daily for 7 days -No further workup at this time      Relevant Medications   cephALEXin  (KEFLEX ) 500 MG capsule   Chronic right shoulder pain   - Patient did have arthroscopy with debridement, mini rotator cuff repair done earlier this year on his right shoulder -He was subsequently found to have full-thickness rotator cuff tear and is scheduled for subsequent surgery next month -He will continue to follow with orthopedics for this -No further workup at this time      Vitamin D  deficiency   - This problem is chronic and stable -We will continue with oral vitamin D  supplementation -Recheck his vitamin D  level today -No further workup at this time      Relevant Orders   VITAMIN D  25 Hydroxy (Vit-D Deficiency, Fractures)   Other Visit Diagnoses       Hyperlipidemia, unspecified hyperlipidemia type       Relevant Medications   simvastatin  (ZOCOR ) 40 MG tablet   lisinopril -hydrochlorothiazide  (ZESTORETIC ) 10-12.5 MG tablet     Essential hypertension       Relevant  Medications   simvastatin  (ZOCOR ) 40 MG tablet   lisinopril -hydrochlorothiazide  (ZESTORETIC ) 10-12.5 MG tablet   Other Relevant Orders   Comprehensive metabolic panel with GFR       Meds ordered this encounter  Medications   simvastatin  (ZOCOR ) 40 MG tablet    Sig: TAKE 1 TABLET BY MOUTH EVERYDAY AT BEDTIME    Dispense:  90 tablet    Refill:  3    PATIENT REQUEST   DISCONTD: lisinopril -hydrochlorothiazide  (ZESTORETIC ) 10-12.5 MG tablet    Sig: TAKE 1 TABLET BY MOUTH DAILY. MUST KEEP JUNE APPT W/NEW PROVIDER FOR FUTURE REFILLS    Dispense:  30 tablet    Refill:  0   cephALEXin  (KEFLEX ) 500 MG capsule    Sig: Take 1 capsule (500 mg total) by mouth 2 (two) times daily.    Dispense:  14 capsule    Refill:  0   lisinopril -hydrochlorothiazide  (ZESTORETIC ) 10-12.5 MG tablet    Sig: TAKE 1 TABLET BY MOUTH DAILY. MUST KEEP JUNE APPT W/NEW PROVIDER FOR FUTURE REFILLS    Dispense:  90 tablet    Refill:  1    No follow-ups on file.  Amariss Detamore, MD

## 2024-05-29 NOTE — Assessment & Plan Note (Signed)
-   Patient has a history of prediabetes but his last A1c was 5.4 -Will recheck his A1c today -No further workup for now

## 2024-05-29 NOTE — Assessment & Plan Note (Signed)
-   Patient's last echo in 2018 showed systolic anterior motion of the mitral valve as well as severe LVH -He did follow-up with cardiology for this but no follow-up recommended at that time -Will repeat 2D echo since it has been 7 years since his last 1 and consider referral to Dr. Court again if necessary

## 2024-05-29 NOTE — Assessment & Plan Note (Signed)
-   This problem is chronic -Patient had borderline vitamin B12 levels at his last visit but was unable to do B12 injections secondary to shoulder issues in his right shoulder -Will recheck vitamin B12 levels today -Patient is unable to come to the office for B12 injection since it is 45 minutes away -Would consider restarting his B12 shots once he is able to give himself injections

## 2024-05-29 NOTE — Assessment & Plan Note (Signed)
-   This problem is chronic and stable -We will recheck his lipid panel today -Refilled his simvastatin  40 mg daily -No further workup at this time

## 2024-05-29 NOTE — Assessment & Plan Note (Signed)
-   Patient did have arthroscopy with debridement, mini rotator cuff repair done earlier this year on his right shoulder -He was subsequently found to have full-thickness rotator cuff tear and is scheduled for subsequent surgery next month -He will continue to follow with orthopedics for this -No further workup at this time

## 2024-05-30 ENCOUNTER — Ambulatory Visit: Payer: Self-pay | Admitting: Internal Medicine

## 2024-05-30 DIAGNOSIS — E876 Hypokalemia: Secondary | ICD-10-CM

## 2024-06-13 ENCOUNTER — Other Ambulatory Visit: Payer: Self-pay | Admitting: Surgery

## 2024-06-18 ENCOUNTER — Other Ambulatory Visit: Payer: Self-pay

## 2024-06-18 ENCOUNTER — Encounter
Admission: RE | Admit: 2024-06-18 | Discharge: 2024-06-18 | Disposition: A | Discharge status: Home / Self Care | Source: Ambulatory Visit | Attending: Surgery | Admitting: Surgery

## 2024-06-18 VITALS — Ht 68.0 in | Wt 166.0 lb

## 2024-06-18 DIAGNOSIS — R7303 Prediabetes: Secondary | ICD-10-CM

## 2024-06-18 DIAGNOSIS — I10 Essential (primary) hypertension: Secondary | ICD-10-CM

## 2024-06-18 DIAGNOSIS — E876 Hypokalemia: Secondary | ICD-10-CM

## 2024-06-18 HISTORY — DX: Complete rotator cuff tear or rupture of right shoulder, not specified as traumatic: M75.121

## 2024-06-18 HISTORY — DX: Other shoulder lesions, unspecified shoulder: M75.80

## 2024-06-18 NOTE — Patient Instructions (Addendum)
 Your procedure is scheduled on:  Wednesday 06/26/24  Report to the Registration Desk on the 1st floor of the [facility]. To find out your arrival time, please call [phone] between 1PM - 3PM on: Tuesday 06/25/24  If your arrival time is 6:00 am, do not arrive before that time as the [facility] entrance doors do not open until 6:00 am.  REMEMBER: Instructions that are not followed completely may result in serious medical risk, up to and including death; or upon the discretion of your surgeon and anesthesiologist your surgery may need to be rescheduled.  Do not eat food after midnight the night before surgery.  No gum chewing or hard candies.  You may however, drink CLEAR liquids up to 2 hours before you are scheduled to arrive for your surgery. Do not drink anything within 2 hours of your scheduled arrival time.  Clear liquids include: - water  - apple juice without pulp - gatorade (not RED colors) - black coffee or tea (Do NOT add milk or creamers to the coffee or tea) Do NOT drink anything that is not on this list.   In addition, your doctor has ordered for you to drink the provided:  Ensure Pre-Surgery Clear Carbohydrate Drink  Drinking this carbohydrate drink up to two hours before surgery helps to reduce insulin resistance and improve patient outcomes. Please complete drinking 2 hours before scheduled arrival time.  One week prior to surgery: Stop Anti-inflammatories (NSAIDS) such as Advil , Aleve, Ibuprofen , meloxicam  (MOBIC ), Motrin , Naproxen, Naprosyn and Aspirin based products such as Excedrin, Goody's Powder, BC Powder. Stop ANY OVER THE COUNTER supplements until after surgery. cyanocobalamin  (VITAMIN B12)  multivitamin (ONE-A-DAY MEN'S   You may however, continue to take Tylenol  if needed for pain up until the day of surgery.  Continue taking all of your other prescription medications up until the day of surgery.  ON THE DAY OF SURGERY ONLY TAKE THESE MEDICATIONS  WITH SIPS OF WATER:  buPROPion (WELLBUTRIN XL) 150 MG    No Alcohol for 24 hours before or after surgery.  No Smoking including e-cigarettes for 24 hours before surgery.  No chewable tobacco products for at least 6 hours before surgery.  No nicotine patches on the day of surgery.  Do not use any recreational drugs for at least a week (preferably 2 weeks) before your surgery.  Please be advised that the combination of cocaine and anesthesia may have negative outcomes, up to and including death. If you test positive for cocaine, your surgery will be cancelled.  On the morning of surgery brush your teeth with toothpaste and water, you may rinse your mouth with mouthwash if you wish. Do not swallow any toothpaste or mouthwash.  Use CHG Soap or wipes as directed on instruction sheet.  Do not wear jewelry, make-up, hairpins, clips or nail polish.  For welded (permanent) jewelry: bracelets, anklets, waist bands, etc.  Please have this removed prior to surgery.  If it is not removed, there is a chance that hospital personnel will need to cut it off on the day of surgery.  Do not wear lotions, powders, or perfumes.   Do not shave body hair from the neck down 48 hours before surgery.  Contact lenses, hearing aids and dentures may not be worn into surgery.  Do not bring valuables to the hospital. [facility] is not responsible for any missing/lost belongings or valuables.   Notify your doctor if there is any change in your medical condition (cold, fever,  infection).  Wear comfortable clothing (specific to your surgery type) to the hospital.  If you are being admitted to the hospital overnight, leave your suitcase in the car. After surgery it may be brought to your room.  In case of increased patient census, it may be necessary for you, the patient, to continue your postoperative care in the Same Day Surgery department.  If you are being discharged the day of surgery, you will not be  allowed to drive home. You will need a responsible individual to drive you home and stay with you for 24 hours after surgery.   If you are taking public transportation, you will need to have a responsible individual with you.  Please call the Pre-admissions Testing Dept. at [phone] if you have any questions about these instructions.  Surgery Visitation Policy:  Patients having surgery or a procedure may have two visitors.  Children under the age of 50 must have an adult with them who is not the patient.  Inpatient Visitation:    Visiting hours are 7 a.m. to 8 p.m. Up to four visitors are allowed at one time in a patient room. The visitors may rotate out with other people during the day.  One visitor age 56 or older may stay with the patient overnight and must be in the room by 8 p.m.   Merchandiser, retail to address health-related social needs:  [email or web address]                                                                                                             Preparing for Surgery with CHLORHEXIDINE  GLUCONATE (CHG) Soap  Chlorhexidine  Gluconate (CHG) Soap  o An antiseptic cleaner that kills germs and bonds with the skin to continue killing germs even after washing  o Used for showering the night before surgery and morning of surgery  Before surgery, you can play an important role by reducing the number of germs on your skin.  CHG (Chlorhexidine  gluconate) soap is an antiseptic cleanser which kills germs and bonds with the skin to continue killing germs even after washing.  Please do not use if you have an allergy to CHG or antibacterial soaps. If your skin becomes reddened/irritated stop using the CHG.  1. Shower the NIGHT BEFORE SURGERY and the MORNING OF SURGERY with CHG soap.  2. If you choose to wash your hair, wash your hair first as usual with your normal shampoo.  3. After shampooing, rinse your hair and body thoroughly to  remove the shampoo.  4. Use CHG as you would any other liquid soap. You can apply CHG directly to the skin and wash gently with a scrungie or a clean washcloth.  5. Apply the CHG soap to your body only from the neck down. Do not use on open wounds or open sores. Avoid contact with your eyes, ears, mouth, and genitals (private parts). Wash face and genitals (private parts) with your normal soap.  6. Wash thoroughly, paying special attention to the area where your surgery will be  performed.  7. Thoroughly rinse your body with warm water.  8. Do not shower/wash with your normal soap after using and rinsing off the CHG soap.  9. Pat yourself dry with a clean towel.  10. Wear clean pajamas to bed the night before surgery.  12. Place clean sheets on your bed the night of your first shower and do not sleep with pets.  13. Shower again with the CHG soap on the day of surgery prior to arriving at the hospital.  14. Do not apply any deodorants/lotions/powders.  15. Please wear clean clothes to the hospital.

## 2024-06-19 ENCOUNTER — Encounter
Admission: RE | Admit: 2024-06-19 | Discharge: 2024-06-19 | Disposition: A | Discharge status: Home / Self Care | Source: Ambulatory Visit | Attending: Surgery | Admitting: Surgery

## 2024-06-19 DIAGNOSIS — I10 Essential (primary) hypertension: Secondary | ICD-10-CM | POA: Insufficient documentation

## 2024-06-19 DIAGNOSIS — R7303 Prediabetes: Secondary | ICD-10-CM | POA: Diagnosis not present

## 2024-06-19 DIAGNOSIS — Z01818 Encounter for other preprocedural examination: Secondary | ICD-10-CM | POA: Diagnosis present

## 2024-06-19 DIAGNOSIS — E876 Hypokalemia: Secondary | ICD-10-CM | POA: Diagnosis not present

## 2024-06-19 LAB — BASIC METABOLIC PANEL WITH GFR
Anion gap: 11 (ref 5–15)
BUN: 12 mg/dL (ref 6–20)
CO2: 28 mmol/L (ref 22–32)
Calcium: 9.8 mg/dL (ref 8.9–10.3)
Chloride: 100 mmol/L (ref 98–111)
Creatinine, Ser: 1.05 mg/dL (ref 0.61–1.24)
GFR, Estimated: >60 mL/min (ref >60)
Glucose, Bld: 101 mg/dL — ABNORMAL HIGH (ref 70–99)
Potassium: 4.2 mmol/L (ref 3.5–5.1)
Sodium: 139 mmol/L (ref 135–145)

## 2024-06-19 LAB — CBC
HCT: 44.2 % (ref 39.0–52.0)
Hemoglobin: 14.3 g/dL (ref 13.0–17.0)
MCH: 27.8 pg (ref 26.0–34.0)
MCHC: 32.4 g/dL (ref 30.0–36.0)
MCV: 85.8 fL (ref 80.0–100.0)
Platelets: 370 K/uL (ref 150–400)
RBC: 5.15 MIL/uL (ref 4.22–5.81)
RDW: 13.2 % (ref 11.5–15.5)
WBC: 9.7 K/uL (ref 4.0–10.5)
nRBC: 0 % (ref 0.0–0.2)

## 2024-06-26 ENCOUNTER — Other Ambulatory Visit: Payer: Self-pay

## 2024-06-26 ENCOUNTER — Ambulatory Visit
Admission: RE | Admit: 2024-06-26 | Discharge: 2024-06-26 | Disposition: A | Discharge status: Home / Self Care | Attending: Surgery | Admitting: Surgery

## 2024-06-26 ENCOUNTER — Encounter
Admission: RE | Disposition: A | Discharge status: Home / Self Care | Payer: Self-pay | Source: Home / Self Care | Attending: Surgery

## 2024-06-26 ENCOUNTER — Ambulatory Visit: Admitting: Certified Registered"

## 2024-06-26 ENCOUNTER — Ambulatory Visit

## 2024-06-26 ENCOUNTER — Ambulatory Visit: Payer: Self-pay | Admitting: Urgent Care

## 2024-06-26 ENCOUNTER — Encounter: Payer: Self-pay | Admitting: Surgery

## 2024-06-26 DIAGNOSIS — I10 Essential (primary) hypertension: Secondary | ICD-10-CM | POA: Insufficient documentation

## 2024-06-26 DIAGNOSIS — Z555 Less than a high school diploma: Secondary | ICD-10-CM | POA: Diagnosis not present

## 2024-06-26 DIAGNOSIS — M7521 Bicipital tendinitis, right shoulder: Secondary | ICD-10-CM | POA: Diagnosis not present

## 2024-06-26 DIAGNOSIS — Z9889 Other specified postprocedural states: Secondary | ICD-10-CM | POA: Insufficient documentation

## 2024-06-26 DIAGNOSIS — M7581 Other shoulder lesions, right shoulder: Secondary | ICD-10-CM | POA: Diagnosis present

## 2024-06-26 DIAGNOSIS — G8929 Other chronic pain: Secondary | ICD-10-CM | POA: Diagnosis present

## 2024-06-26 DIAGNOSIS — Z5986 Financial insecurity: Secondary | ICD-10-CM | POA: Diagnosis not present

## 2024-06-26 DIAGNOSIS — M75121 Complete rotator cuff tear or rupture of right shoulder, not specified as traumatic: Secondary | ICD-10-CM | POA: Insufficient documentation

## 2024-06-26 DIAGNOSIS — Z79899 Other long term (current) drug therapy: Secondary | ICD-10-CM | POA: Insufficient documentation

## 2024-06-26 DIAGNOSIS — G709 Myoneural disorder, unspecified: Secondary | ICD-10-CM | POA: Insufficient documentation

## 2024-06-26 DIAGNOSIS — F319 Bipolar disorder, unspecified: Secondary | ICD-10-CM | POA: Insufficient documentation

## 2024-06-26 DIAGNOSIS — F419 Anxiety disorder, unspecified: Secondary | ICD-10-CM | POA: Diagnosis not present

## 2024-06-26 DIAGNOSIS — K219 Gastro-esophageal reflux disease without esophagitis: Secondary | ICD-10-CM | POA: Diagnosis not present

## 2024-06-26 HISTORY — PX: SHOULDER OPEN ROTATOR CUFF REPAIR: SHX2407

## 2024-06-26 HISTORY — PX: POSTERIOR LUMBAR FUSION 2 WITH HARDWARE REMOVAL: SHX7297

## 2024-06-26 SURGERY — ARTHROSCOPY, SHOULDER WITH DEBRIDEMENT
Anesthesia: General | Site: Shoulder | Laterality: Right

## 2024-06-26 MED ORDER — BUPIVACAINE HCL (PF) 0.5 % IJ SOLN
INTRAMUSCULAR | Status: AC
  Filled 2024-06-26: qty 10

## 2024-06-26 MED ORDER — FENTANYL CITRATE PF 50 MCG/ML IJ SOSY
50.0000 ug | PREFILLED_SYRINGE | Freq: Once | INTRAMUSCULAR | Status: AC
  Administered 2024-06-26: 50 ug via INTRAVENOUS

## 2024-06-26 MED ORDER — MIDAZOLAM HCL 2 MG/2ML IJ SOLN
INTRAMUSCULAR | Status: AC
  Filled 2024-06-26: qty 2

## 2024-06-26 MED ORDER — MIDAZOLAM HCL 2 MG/2ML IJ SOLN
1.0000 mg | INTRAMUSCULAR | Status: DC | PRN
  Administered 2024-06-26: 1 mg via INTRAVENOUS

## 2024-06-26 MED ORDER — DEXAMETHASONE SODIUM PHOSPHATE 10 MG/ML IJ SOLN
INTRAMUSCULAR | Status: DC | PRN
  Administered 2024-06-26: 10 mg via INTRAVENOUS

## 2024-06-26 MED ORDER — BUPIVACAINE-EPINEPHRINE (PF) 0.5% -1:200000 IJ SOLN
INTRAMUSCULAR | Status: AC
  Filled 2024-06-26: qty 30

## 2024-06-26 MED ORDER — CHLORHEXIDINE GLUCONATE 0.12 % MT SOLN
15.0000 mL | Freq: Once | OROMUCOSAL | Status: AC
  Administered 2024-06-26: 15 mL via OROMUCOSAL

## 2024-06-26 MED ORDER — PHENYLEPHRINE 80 MCG/ML (10ML) SYRINGE FOR IV PUSH (FOR BLOOD PRESSURE SUPPORT)
PREFILLED_SYRINGE | INTRAVENOUS | Status: DC | PRN
  Administered 2024-06-26 (×2): 160 ug via INTRAVENOUS

## 2024-06-26 MED ORDER — IBUPROFEN 800 MG PO TABS: 800.0000 mg | ORAL_TABLET | Freq: Three times a day (TID) | ORAL | 0 refills | Status: DC

## 2024-06-26 MED ORDER — FENTANYL CITRATE (PF) 100 MCG/2ML IJ SOLN: 25.0000 ug | INTRAMUSCULAR | Status: DC | PRN

## 2024-06-26 MED ORDER — OXYCODONE HCL 10 MG PO TABS: 10.0000 mg | ORAL_TABLET | ORAL | 0 refills | Status: DC | PRN

## 2024-06-26 MED ORDER — LACTATED RINGERS IV SOLN: INTRAVENOUS | Status: DC

## 2024-06-26 MED ORDER — ONDANSETRON HCL 4 MG/2ML IJ SOLN
INTRAMUSCULAR | Status: DC | PRN
  Administered 2024-06-26: 4 mg via INTRAVENOUS

## 2024-06-26 MED ORDER — ORAL CARE MOUTH RINSE: 15.0000 mL | Freq: Once | OROMUCOSAL | Status: AC

## 2024-06-26 MED ORDER — CEFAZOLIN SODIUM-DEXTROSE 2-4 GM/100ML-% IV SOLN
INTRAVENOUS | Status: AC
  Filled 2024-06-26: qty 100

## 2024-06-26 MED ORDER — FENTANYL CITRATE (PF) 100 MCG/2ML IJ SOLN
INTRAMUSCULAR | Status: DC | PRN
  Administered 2024-06-26: 100 ug via INTRAVENOUS

## 2024-06-26 MED ORDER — PROPOFOL 10 MG/ML IV BOLUS
INTRAVENOUS | Status: DC | PRN
  Administered 2024-06-26: 50 mg via INTRAVENOUS
  Administered 2024-06-26: 150 mg via INTRAVENOUS

## 2024-06-26 MED ORDER — OXYCODONE HCL 5 MG/5ML PO SOLN: 5.0000 mg | Freq: Once | ORAL | Status: DC | PRN

## 2024-06-26 MED ORDER — LACTATED RINGERS IV SOLN
INTRAVENOUS | Status: DC | PRN
  Administered 2024-06-26: 1501 mL

## 2024-06-26 MED ORDER — PHENYLEPHRINE HCL-NACL 20-0.9 MG/250ML-% IV SOLN
INTRAVENOUS | Status: DC | PRN
  Administered 2024-06-26: 50 ug/min via INTRAVENOUS

## 2024-06-26 MED ORDER — EPINEPHRINE PF 1 MG/ML IJ SOLN
INTRAMUSCULAR | Status: AC
  Filled 2024-06-26: qty 1

## 2024-06-26 MED ORDER — CHLORHEXIDINE GLUCONATE 0.12 % MT SOLN
OROMUCOSAL | Status: AC
  Filled 2024-06-26: qty 15

## 2024-06-26 MED ORDER — KETOROLAC TROMETHAMINE 30 MG/ML IJ SOLN
30.0000 mg | Freq: Once | INTRAMUSCULAR | Status: AC
  Administered 2024-06-26: 30 mg via INTRAVENOUS

## 2024-06-26 MED ORDER — KETOROLAC TROMETHAMINE 30 MG/ML IJ SOLN
INTRAMUSCULAR | Status: AC
  Filled 2024-06-26: qty 1

## 2024-06-26 MED ORDER — BUPIVACAINE HCL (PF) 0.5 % IJ SOLN
INTRAMUSCULAR | Status: DC | PRN
Start: 2024-06-26 — End: 2024-06-26
  Administered 2024-06-26: 10 mL

## 2024-06-26 MED ORDER — OXYCODONE HCL 5 MG PO TABS: 5.0000 mg | ORAL_TABLET | Freq: Once | ORAL | Status: DC | PRN

## 2024-06-26 MED ORDER — CEFAZOLIN SODIUM-DEXTROSE 2-4 GM/100ML-% IV SOLN
2.0000 g | INTRAVENOUS | Status: AC
  Administered 2024-06-26: 2 g via INTRAVENOUS

## 2024-06-26 MED ORDER — EPHEDRINE SULFATE-NACL 50-0.9 MG/10ML-% IV SOSY
PREFILLED_SYRINGE | INTRAVENOUS | Status: DC | PRN
  Administered 2024-06-26: 10 mg via INTRAVENOUS

## 2024-06-26 MED ORDER — BUPIVACAINE-EPINEPHRINE 0.5% -1:200000 IJ SOLN
INTRAMUSCULAR | Status: DC | PRN
Start: 2024-06-26 — End: 2024-06-26
  Administered 2024-06-26: 30 mL

## 2024-06-26 MED ORDER — SUCCINYLCHOLINE CHLORIDE 200 MG/10ML IV SOSY
PREFILLED_SYRINGE | INTRAVENOUS | Status: DC | PRN
  Administered 2024-06-26: 100 mg via INTRAVENOUS

## 2024-06-26 MED ORDER — GLYCOPYRROLATE 0.2 MG/ML IJ SOLN
INTRAMUSCULAR | Status: DC | PRN
  Administered 2024-06-26: .2 mg via INTRAVENOUS

## 2024-06-26 MED ORDER — BUPIVACAINE LIPOSOME 1.3 % IJ SUSP
INTRAMUSCULAR | Status: DC | PRN
Start: 2024-06-26 — End: 2024-06-26
  Administered 2024-06-26: 20 mL

## 2024-06-26 MED ORDER — BUPIVACAINE LIPOSOME 1.3 % IJ SUSP
INTRAMUSCULAR | Status: AC
  Filled 2024-06-26: qty 20

## 2024-06-26 MED ORDER — FENTANYL CITRATE PF 50 MCG/ML IJ SOSY
PREFILLED_SYRINGE | INTRAMUSCULAR | Status: AC
  Filled 2024-06-26: qty 1

## 2024-06-26 MED ORDER — DEXMEDETOMIDINE HCL IN NACL 80 MCG/20ML IV SOLN
INTRAVENOUS | Status: DC | PRN
Start: 2024-06-26 — End: 2024-06-26
  Administered 2024-06-26: 20 ug via INTRAVENOUS

## 2024-06-26 SURGICAL SUPPLY — 43 items
ANCHOR HEALICOIL REGEN 5.5 (Anchor) IMPLANT
ANCHOR QFIX 2.8 SUT MINI TAPE (Anchor) IMPLANT
BIT DRILL JUGRKNT W/NDL BIT2.9 (DRILL) IMPLANT
BLADE FULL RADIUS 3.5 (BLADE) ×1 IMPLANT
BUR ACROMIONIZER 4.0 (BURR) ×1 IMPLANT
CHLORAPREP W/TINT 26 (MISCELLANEOUS) ×1 IMPLANT
COVER MAYO STAND STRL (DRAPES) ×1 IMPLANT
DILATOR 5.5 THREADED HEALICOIL (MISCELLANEOUS) IMPLANT
ELECT CAUTERY BLADE 6.4 (BLADE) ×1 IMPLANT
ELECTRODE REM PT RTRN 9FT ADLT (ELECTROSURGICAL) ×1 IMPLANT
GAUZE SPONGE 4X4 12PLY STRL (GAUZE/BANDAGES/DRESSINGS) ×1 IMPLANT
GAUZE XEROFORM 1X8 LF (GAUZE/BANDAGES/DRESSINGS) ×1 IMPLANT
GLOVE BIO SURGEON STRL SZ7.5 (GLOVE) ×2 IMPLANT
GLOVE BIO SURGEON STRL SZ8 (GLOVE) ×2 IMPLANT
GLOVE BIOGEL PI IND STRL 8 (GLOVE) ×1 IMPLANT
GLOVE INDICATOR 8.0 STRL GRN (GLOVE) ×1 IMPLANT
GOWN STRL REUS W/ TWL LRG LVL3 (GOWN DISPOSABLE) ×1 IMPLANT
GOWN STRL REUS W/ TWL XL LVL3 (GOWN DISPOSABLE) ×1 IMPLANT
GRASPER SUT 15 45D LOW PRO (SUTURE) IMPLANT
IV LR IRRIG 3000ML ARTHROMATIC (IV SOLUTION) ×1 IMPLANT
KIT CANNULA 8X76-LX IN CANNULA (CANNULA) ×1 IMPLANT
KIT SUTURE 1.8 Q-FIX DISP (KITS) IMPLANT
KIT SUTURE 2.8 Q-FIX DISP (MISCELLANEOUS) IMPLANT
LOOP VESSEL MAXI 1X406 RED (MISCELLANEOUS) IMPLANT
MANIFOLD NEPTUNE II (INSTRUMENTS) ×1 IMPLANT
MASK FACE SPIDER DISP (MASK) ×1 IMPLANT
MAT ABSORB FLUID 56X50 GRAY (MISCELLANEOUS) ×1 IMPLANT
PACK ARTHROSCOPY SHOULDER (MISCELLANEOUS) ×1 IMPLANT
PAD ABD DERMACEA PRESS 5X9 (GAUZE/BANDAGES/DRESSINGS) ×1 IMPLANT
PASSER SUT FIRSTPASS SELF (INSTRUMENTS) IMPLANT
PENCIL SMOKE EVACUATOR (MISCELLANEOUS) ×1 IMPLANT
SLING ULTRA II LG (MISCELLANEOUS) ×1 IMPLANT
SPONGE T-LAP 18X18 ~~LOC~~+RFID (SPONGE) ×1 IMPLANT
STAPLER SKIN PROX 35W (STAPLE) ×1 IMPLANT
STRAP SAFETY 5IN WIDE (MISCELLANEOUS) ×1 IMPLANT
SUT ETHIBOND 0 MO6 C/R (SUTURE) ×1 IMPLANT
SUT VIC AB 2-0 CT1 (SUTURE) IMPLANT
SUT VIC AB 2-0 CT1 TAPERPNT 27 (SUTURE) ×2 IMPLANT
TAPE MICROFOAM 4IN (TAPE) ×1 IMPLANT
TRAP FLUID SMOKE EVACUATOR (MISCELLANEOUS) ×1 IMPLANT
TUBE SET DOUBLEFLO INFLOW (TUBING) ×1 IMPLANT
TUBING CONNECTING 10 (TUBING) ×1 IMPLANT
WAND WEREWOLF FLOW 90D (MISCELLANEOUS) ×1 IMPLANT

## 2024-06-26 NOTE — Discharge Instructions (Addendum)
 Orthopedic discharge instructions: May shower with intact OpSite dressings once nerve block has worn off (Sunday), then reapply shoulder immobilizer. Apply ice frequently to shoulder. Take ibuprofen  800 mg TID with meals for 7-10 days, then may resume meloxicam  if preferred. Take oxycodone  as prescribed when needed.  May supplement with ES Tylenol  if necessary. Keep shoulder immobilizer on at all times except may remove for bathing purposes. Follow-up in 10-14 days or as scheduled.

## 2024-06-26 NOTE — Anesthesia Procedure Notes (Signed)
 Procedure Name: Intubation Date/Time: 06/26/2024 11:17 AM  Performed by: Norleen Alberta HERO., CRNAPre-anesthesia Checklist: Patient identified, Patient being monitored, Timeout performed, Emergency Drugs available and Suction available Patient Re-evaluated:Patient Re-evaluated prior to induction Oxygen Delivery Method: Circle system utilized Preoxygenation: Pre-oxygenation with 100% oxygen Induction Type: IV induction Ventilation: Mask ventilation without difficulty Laryngoscope Size: McGrath and 4 Grade View: Grade I Tube type: Oral Tube size: 7.5 mm Number of attempts: 1 Airway Equipment and Method: Stylet Placement Confirmation: ETT inserted through vocal cords under direct vision, positive ETCO2 and breath sounds checked- equal and bilateral Secured at: 22 cm Tube secured with: Tape Dental Injury: Teeth and Oropharynx as per pre-operative assessment

## 2024-06-26 NOTE — H&P (Signed)
 History of Present Illness:  William Chase is a 58 y.o. male who presents for follow-up now 4.5 months status post an arthroscopic debridement, SLAP repair, subacromial decompression, and mini-open rotator cuff repair of his right shoulder. The patient's postoperative course has been complicated by numerous falls which have resulted in further injury to his right shoulder, as well as a laceration to the posterior aspect of his elbow with subsequent development of septic olecranon bursitis. Apparently, the patient also developed a small abscess along the right abdominal wall which grew out MRSA. These areas of infection have been treated with oral antibiotics and Bactroban  with some benefit. The patient has undergone an ultrasound evaluation of his right shoulder by Dr. Sharrie. According to his report, he is concerned that the patient may have a full-thickness recurrent tear of the anterior 50% of the supraspinatus tendon. The patient continues to experience moderate-severe pain in the shoulder which he rates at 7/10 on today's visit he has been utilizing over-the-counter medications as needed with temporary partial relief of his symptoms. He has difficulty with activities at or above shoulder level, as well as when trying to reach behind his back. He denies any more recent reinjury to his shoulder, and denies any fevers or chills. He also denies any numbness or paresthesias down his arm to his hand.  Current Outpatient Medications:  amoxicillin -clavulanate (AUGMENTIN ) 875-125 mg tablet Take 1 tablet by mouth 2 (two) times daily  biotin 100 mg/gram Powd Take by mouth  biotin, bulk, 100 % Powd Take 1 tablet by mouth once daily  buPROPion (WELLBUTRIN SR) 150 MG SR tablet 3 tablets per day in the morning  chlorhexidine  (HIBICLENS ) 4 % external wash Apply topically  cholecalciferol, vitamin D3, (VITAMIN D3) 125 mcg (5,000 unit) tablet Take by mouth  clindamycin (CLEOCIN) 150 MG capsule take one capsule 3  times daily  collagenase  (SANTYL ) ointment Apply topically  cyanocobalamin  (VITAMIN B12) 1,000 mcg/mL injection Inject into the muscle  cyclobenzaprine  (FLEXERIL ) 10 MG tablet TAKE 1 TABLET BY MOUTH THREE TIMES A DAY AS NEEDED FOR MUSCLE SPASMS 60 tablet 0  dextromethorphan -guaifenesin  60-1,200 mg per 12 hr tablet Take by mouth  diazePAM (VALIUM) 10 MG tablet Take by mouth  diclofenac  (VOLTAREN ) 1 % topical gel Apply topically  fluocinolone  acetonide (DERMOTIC ) 0.01 % otic drop Place in ear(s)  fluticasone  propion-salmeteroL (ADVAIR DISKUS) 100-50 mcg/dose diskus inhaler INHALE 1 PUFF INTO THE LUNGS TWICE DAILY rinse mouth  hydrocortisone  2.5 % lotion Apply topically  ketoconazole  (NIZORAL ) 2 % shampoo Apply topically  lactulose (ENULOSE) 10 gram/15 mL oral solution Take by mouth  lisinopriL -hydrochlorothiazide  (ZESTORETIC ) 10-12.5 mg tablet Take by mouth  lubiprostone  (AMITIZA ) 24 MCG capsule Take 24 mcg by mouth  meloxicam  (MOBIC ) 7.5 MG tablet TAKE 1 TABLET BY MOUTH EVERY DAY 30 tablet 1  methylphenidate HCl (RITALIN) 10 MG tablet  metroNIDAZOLE  (FLAGYL ) 500 MG tablet Take by mouth  multivitamin tablet Take 1 tablet by mouth once daily  mupirocin  (BACTROBAN ) 2 % ointment Apply topically  needle, disp, 25 gauge (BD PRECISIONGLIDE) 25 gauge x 1 Ndle Use  oxymetazoline  (AFRIN) 0.05 % nasal spray Place 1 spray into one nostril  QUEtiapine (SEROQUEL) 400 MG tablet  simvastatin  (ZOCOR ) 40 MG tablet Take 40 mg by mouth at bedtime  traZODone (DESYREL) 100 MG tablet TAKE 1 TABLET BY MOUTH AT BEDTIME MAY REPEAT   Allergies:  Iodine And Iodide Containing Products Anaphylaxis   Past Medical History: No past medical history on file.  Past Surgical History:  COLONOSCOPY  2011 (none per patient)  COLONOSCOPY 10/06/2021 (Hyperplastic polyp/Repeat 65yrs/TKT)  ARTHROSCOPY SHOULDER Right 01/16/2024 (Dr. Edie)   Past Family History: No family history on file.   Social History:    Socioeconomic History:  Marital status: Widowed  Number of children: 0  Years of education: 8  Highest education level: 8th grade  Occupational History  Occupation: Disabled  Tobacco Use  Smoking status: Never  Smokeless tobacco: Never  Vaping Use  Vaping status: Never Used  Substance and Sexual Activity  Alcohol use: Never  Drug use: Never  Sexual activity: Defer  Partners: Female   Social Drivers of Health:   Physicist, medical Strain: Medium Risk (05/02/2024)  Overall Financial Resource Strain (CARDIA)  Difficulty of Paying Living Expenses: Somewhat hard  Food Insecurity: Food Insecurity Present (05/02/2024)  Hunger Vital Sign  Worried About Running Out of Food in the Last Year: Sometimes true  Ran Out of Food in the Last Year: Sometimes true  Transportation Needs: No Transportation Needs (05/02/2024)  PRAPARE - Risk analyst (Medical): No  Lack of Transportation (Non-Medical): No  Recent Concern: Transportation Needs - Unmet Transportation Needs (02/29/2024)  PRAPARE - Risk analyst (Medical): No  Lack of Transportation (Non-Medical): Yes   Review of Systems:  A comprehensive 14 point ROS was performed, reviewed, and the pertinent orthopaedic findings are documented in the HPI.  Physical Exam: Vitals:  06/07/24 1350  BP: 130/85  Weight: 77.7 kg (171 lb 6.4 oz)  Height: 172.7 cm (5' 8)  PainSc: 7  PainLoc: Shoulder   General/Constitutional: The patient appears to be well-nourished, well-developed, and in no acute distress. Neuro/Psych: Normal mood and affect, oriented to person, place and time. Eyes: Non-icteric. Pupils are equal, round, and reactive to light, and exhibit synchronous movement. ENT: Unremarkable. Lymphatic: No palpable adenopathy. Respiratory: Lungs clear to auscultation, Normal chest excursion, No wheezes, and Non-labored breathing Cardiovascular: Regular rate and rhythm with holosystolic  murmur and No edema, swelling or tenderness, except as noted in detailed exam. Integumentary: No impressive skin lesions present, except as noted in detailed exam. Musculoskeletal: Unremarkable, except as noted in detailed exam.  Right shoulder exam: Skin inspection of the right shoulder is notable for well-healed arthroscopic portal sites and a surgical incision, but otherwise is unremarkable. No swelling, erythema, ecchymosis, abrasions, or other skin abnormalities are identified. He has mild tenderness palpation over the anterolateral aspect of the shoulder. He also has a an obvious Popeye deformity which is uncomfortable, especially when he flexes his elbow. Actively, he is able to forward flex to 130 degrees, abduct to 125 degrees, and internally rotate to L1. Passively, he can tolerate forward flexion to 145 degrees and abduction to 135 degrees. At 90 degrees of abduction, he can tolerate external rotation to 75 degrees and internal rotation to 55 degrees. He experiences mild-moderate pain at the extremes of all motions. He exhibits 4/5 strength with resisted internal and external rotation, as well as with resisted forward flexion and abduction. He experiences mild pain with resisted forward flexion and abduction. He is grossly neurovascularly intact to the right upper extremity and hand.  Right elbow exam:  The area of bursitis over the posterior aspect of his elbow appears to have resolved. There is some purplish discoloration to the skin, but no erythema, and no fluid accumulation in the bursal region. He exhibits full active and passive range of motion of the elbow without any pain. There is no elbow effusion.  Assessment: 1. Nontraumatic complete tear  of right rotator cuff.  2. Rotator cuff tendinitis, right.  3. Status post right rotator cuff repair.   Plan: The treatment options were discussed with the patient. In addition, patient educational materials were provided regarding the  diagnosis and treatment options. The patient is quite frustrated by his recurrent symptoms and functional limitations as a pertains to his right shoulder. Therefore, I have recommended surgical procedure, specifically a right shoulder arthroscopy with debridement, decompression, rotator cuff repair, and possible revision biceps tenodesis. The procedure was discussed with the patient, as were the potential risks (including bleeding, infection, nerve and/or blood vessel injury, persistent or recurrent pain, failure of the repair, progression of arthritis, need for further surgery, blood clots, strokes, heart attacks and/or arhythmias, pneumonia, etc.) and benefits. The patient states his understanding and wishes to proceed. All of the patient's questions and concerns were answered. He can call any time with further concerns. He will follow up post-surgery, routine.    H&P reviewed and patient re-examined. No changes.

## 2024-06-26 NOTE — Anesthesia Postprocedure Evaluation (Signed)
 Anesthesia Post Note  Patient: William Chase  Procedure(s) Performed: ARTHROSCOPY, SHOULDER WITH DEBRIDEMENT (Right: Shoulder) REPAIR, ROTATOR CUFF, OPEN (Right: Shoulder)  Patient location during evaluation: PACU Anesthesia Type: General Level of consciousness: awake and alert Pain management: pain level controlled Vital Signs Assessment: post-procedure vital signs reviewed and stable Respiratory status: spontaneous breathing, nonlabored ventilation, respiratory function stable and patient connected to nasal cannula oxygen Cardiovascular status: blood pressure returned to baseline and stable Postop Assessment: no apparent nausea or vomiting Anesthetic complications: no   There were no known notable events for this encounter.   Last Vitals:  Vitals:   06/26/24 1430 06/26/24 1455  BP: 116/76 (!) 141/89  Pulse: 81 71  Resp: 17 18  Temp: (!) 36.1 C 36.5 C  SpO2: 99% 98%    Last Pain:  Vitals:   06/26/24 1455  TempSrc: Temporal  PainSc: 0-No pain                 Debby Mines

## 2024-06-26 NOTE — Transfer of Care (Signed)
 Immediate Anesthesia Transfer of Care Note  Patient: William Chase  Procedure(s) Performed: ARTHROSCOPY, SHOULDER WITH DEBRIDEMENT (Right: Shoulder) REPAIR, ROTATOR CUFF, OPEN (Right: Shoulder)  Patient Location: PACU  Anesthesia Type:General  Level of Consciousness: awake, alert , and oriented  Airway & Oxygen Therapy: Patient Spontanous Breathing and Patient connected to face mask oxygen  Post-op Assessment: Report given to RN and Post -op Vital signs reviewed and stable  Post vital signs: stable  Last Vitals:  Vitals Value Taken Time  BP 118/107 06/26/24 13:22  Temp    Pulse 86 06/26/24 13:24  Resp 28 06/26/24 13:24  SpO2 100 % 06/26/24 13:24  Vitals shown include unfiled device data.  Last Pain:  Vitals:   06/26/24 0913  PainSc: 7          Complications: No notable events documented.

## 2024-06-26 NOTE — Anesthesia Procedure Notes (Signed)
 Anesthesia Regional Block: Interscalene brachial plexus block   Pre-Anesthetic Checklist: , timeout performed,  Correct Patient, Correct Site, Correct Laterality,  Correct Procedure, Correct Position, site marked,  Risks and benefits discussed,  Surgical consent,  Pre-op evaluation,  At surgeon's request and post-op pain management  Laterality: Right  Prep: chloraprep       Needles:  Injection technique: Single-shot  Needle Type: Echogenic Needle     Needle Length: 4cm  Needle Gauge: 25     Additional Needles:   Procedures:,,,, ultrasound used (permanent image in chart),,    Narrative:  Start time: 06/26/2024 10:22 AM End time: 06/26/2024 10:24 AM Injection made incrementally with aspirations every 5 mL.  Performed by: Personally  Anesthesiologist: Leavy Ned, MD  Additional Notes: Patient's chart reviewed and they were deemed appropriate candidate for procedure, at surgeon's request. Patient educated about risks, benefits, and alternatives of the block including but not limited to: temporary or permanent nerve damage, bleeding, infection, damage to surround tissues, pneumothorax, hemidiaphragmatic paralysis, unilateral Horner's syndrome, block failure, local anesthetic toxicity. Patient expressed understanding. A formal time-out was conducted consistent with institution rules.  Monitors were applied, and minimal sedation used (see nursing record). The site was prepped with skin prep and allowed to dry, and sterile gloves were used. A high frequency linear ultrasound probe with probe cover was utilized throughout. C5-7 nerve roots located and appeared anatomically normal, local anesthetic injected around them, and echogenic block needle trajectory was monitored throughout. Aspiration performed every 5ml. Lung and blood vessels were avoided. All injections were performed without resistance and free of blood and paresthesias. The patient tolerated the procedure well.  Injectate:  20ml exparel  + 10ml 0.5% bupivacaine 

## 2024-06-26 NOTE — Op Note (Signed)
 06/26/2024  12:57 PM  Patient:   William Chase  Pre-Op Diagnosis:   Recurrent rotator cuff tear status post prior rotator cuff repair right shoulder  Post-Op Diagnosis:   Recurrent rotator cuff tear status post prior rotator cuff repair with biceps tendinitis, right shoulder.  Procedure:   Limited arthroscopic debridement, arthroscopic subacromial decompression, mini-open rotator cuff repair, and mini-open biceps tenodesis, right shoulder.  Anesthesia:   General endotracheal with interscalene block using Exparel  placed preoperatively by the anesthesiologist.  Surgeon:   DOROTHA Reyes Maltos, MD  Assistant:   Toribio Alas, RNFA  Findings:   As above. There was a recurrent near full-thickness tear involving the anterior middle insertional fibers of the supraspinatus tendon. The remainder of the rotator cuff was in satisfactory condition. There was minimal labral fraying posteriorly. The articular surfaces of the glenoid and humerus both were in satisfactory condition.  Complications:   None  Estimated blood loss:   50 cc  Fluids:   700 cc  Tourniquet time:   None  Drains:   None  Closure:   Staples      Brief clinical note:   The patient is a 58 year old male who is now 5 months status post a right shoulder arthroscopy debridement, decompression, rotator cuff repair, and biceps tenodesis.  The patient sustained numerous falls the first 6 or so weeks after his surgery, reinjuring his shoulder.  His symptoms have persisted despite medications, activity modification, etc. The patient's history and examination are consistent with impingement/tendinopathy with a recurrent rotator cuff tear confirmed by diagnostic ultrasound. The patient also complained of recurrent cramping in his biceps muscle, most likely resulting from the fact that his biceps tenodesis had avulsed. The patient presents at this time for definitive management of these shoulder symptoms.  Procedure:   The patient underwent  placement of an interscalene block using Exparel  by the anesthesiologist in the preoperative holding area before being brought into the operating room and lain in the supine position. The patient then underwent general endotracheal intubation and anesthesia before being repositioned in the beach chair position using the beach chair positioner. The right shoulder and upper extremity were prepped with ChloraPrep solution before being draped sterilely. Preoperative antibiotics were administered. A timeout was performed to confirm the proper surgical site before the expected portal sites and incision site were injected with 0.5% Sensorcaine  with epinephrine .   A posterior portal was created and the glenohumeral joint thoroughly inspected with the findings as described above. There appeared to be evidence of at least partial re-tearing of the anterior insertional fibers of the supraspinatus tendon, consistent with the preoperative ultrasound findings. The remainder of the intra-articular findings were as described above.  The camera was repositioned through the posterior portal into the subacromial space. A separate lateral portal was created using an outside-in technique. The 3.5 mm full-radius resector was introduced and used to perform a subtotal bursectomy. The ArthroCare wand was then inserted and used cleaned up the scar tissue in the subacromial space, as well as to remove the periosteal tissue off the undersurface of the anterior third of the acromion. The 4.0 mm acromionizing bur was introduced and used to complete the decompression by removing the undersurface of the anterior third of the acromion. The full radius resector was reintroduced to remove any residual bony debris before the ArthroCare wand was reintroduced to obtain hemostasis. The instruments were then removed from the subacromial space after suctioning the excess fluid.  Utilizing the previous incision an approximately 6-7 cm  incision was  made over the anterolateral aspect of the shoulder beginning at the anterolateral corner of the acromion and extending distally in line with the bicipital groove. This incision was carried down through the subcutaneous tissues to expose the deltoid fascia. The raphae between the anterior and middle thirds was identified and this plane developed to provide access into the subacromial space. Additional bursal tissues were debrided sharply using Metzenbaum scissors. The rotator cuff tear was readily identified.   The margins of the rotator cuff tear were debrided sharply with a #15 blade and the exposed greater tuberosity roughened with a rongeur. The previously placed sutures were removed using a small rongeur as well. The tear was repaired using two [employer] 2.8 mm Q-Fix anchors. The more medial four sutures were then brought back laterally and secured using one [employer] Nephew Healicoil knotless RegeneSorb anchors to create a two-layer closure. An apparent watertight closure was obtained.  The bicipital groove was identified by palpation. An attempt was made to identify the bicipital tendon stump, but this was not easily visualized. Therefore, an approximately 2 to 3 cm incision was made over the anteromedial aspect of the upper arm just below the level of the pectoralis major tendon to identify the tendon at this level and to hopefully perform a Sub pec tenodesis. However, despite extensive dissection, clear identification of the biceps tendon was not able to be made. In fact, concern was raised that the structure identified was actually the musculocutaneous nerve. The ultrasound machine was brought in and used to track this structure proximally. It did appear to be headed more medially toward the axilla rather than toward the bicipital groove.  Therefore, the idea of a subpectoralis tenodesis was abandoned and attention returned to the more proximal incision.   After extending the incision distally, the  biceps tendon stump was identified more distally and freed up from the surrounding scar tissue.  It was pulled proximally, then secured to the adjacent pectoralis tendon using several #0 Ethibond interrupted sutures to complete the revision tenodesis.  The wounds were copiously irrigated with sterile saline solution proximally, the deltoid raphae was reapproximated using 2-0 Vicryl interrupted sutures. The subcutaneous tissues were closed in two layers using 2-0 Vicryl interrupted sutures before the skin was closed using staples the more distal anterior wound also was closed using 2-0 Vicryl interrupted sutures in two layers before the skin was closed using staples. The portal sites also were closed using staples. A sterile bulky dressing was applied to the shoulder before the arm was placed into a shoulder immobilizer. The patient was then awakened, extubated, and returned to the recovery room in satisfactory condition after tolerating the procedure well.
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
## 2024-06-27 ENCOUNTER — Encounter: Payer: Self-pay | Admitting: Surgery

## 2024-10-29 ENCOUNTER — Encounter: Payer: Self-pay | Admitting: Surgery

## 2024-10-31 ENCOUNTER — Ambulatory Visit (INDEPENDENT_AMBULATORY_CARE_PROVIDER_SITE_OTHER)

## 2024-10-31 VITALS — BP 110/70 | HR 98 | Temp 97.8°F | Ht 68.0 in | Wt 173.8 lb

## 2024-10-31 DIAGNOSIS — I10 Essential (primary) hypertension: Secondary | ICD-10-CM | POA: Diagnosis not present

## 2024-10-31 DIAGNOSIS — R202 Paresthesia of skin: Secondary | ICD-10-CM | POA: Diagnosis not present

## 2024-10-31 DIAGNOSIS — E785 Hyperlipidemia, unspecified: Secondary | ICD-10-CM

## 2024-10-31 DIAGNOSIS — R9089 Other abnormal findings on diagnostic imaging of central nervous system: Secondary | ICD-10-CM | POA: Insufficient documentation

## 2024-10-31 DIAGNOSIS — R7303 Prediabetes: Secondary | ICD-10-CM | POA: Diagnosis not present

## 2024-10-31 DIAGNOSIS — R011 Cardiac murmur, unspecified: Secondary | ICD-10-CM | POA: Diagnosis not present

## 2024-10-31 DIAGNOSIS — F39 Unspecified mood [affective] disorder: Secondary | ICD-10-CM

## 2024-10-31 DIAGNOSIS — Z9889 Other specified postprocedural states: Secondary | ICD-10-CM

## 2024-10-31 DIAGNOSIS — R296 Repeated falls: Secondary | ICD-10-CM | POA: Insufficient documentation

## 2024-10-31 DIAGNOSIS — K5903 Drug induced constipation: Secondary | ICD-10-CM

## 2024-10-31 DIAGNOSIS — E538 Deficiency of other specified B group vitamins: Secondary | ICD-10-CM

## 2024-10-31 LAB — COMPREHENSIVE METABOLIC PANEL WITH GFR
ALT: 14 U/L (ref 3–53)
AST: 15 U/L (ref 5–37)
Albumin: 4.4 g/dL (ref 3.5–5.2)
Alkaline Phosphatase: 84 U/L (ref 39–117)
BUN: 21 mg/dL (ref 6–23)
CO2: 33 meq/L — ABNORMAL HIGH (ref 19–32)
Calcium: 9.8 mg/dL (ref 8.4–10.5)
Chloride: 100 meq/L (ref 96–112)
Creatinine, Ser: 0.94 mg/dL (ref 0.40–1.50)
GFR: 90.93 mL/min
Glucose, Bld: 93 mg/dL (ref 70–99)
Potassium: 5.2 meq/L — ABNORMAL HIGH (ref 3.5–5.1)
Sodium: 139 meq/L (ref 135–145)
Total Bilirubin: 0.3 mg/dL (ref 0.2–1.2)
Total Protein: 7.4 g/dL (ref 6.0–8.3)

## 2024-10-31 LAB — HEMOGLOBIN A1C: Hgb A1c MFr Bld: 5.6 % (ref 4.6–6.5)

## 2024-10-31 LAB — VITAMIN B12: Vitamin B-12: 272 pg/mL (ref 211–911)

## 2024-10-31 MED ORDER — SIMVASTATIN 40 MG PO TABS: ORAL_TABLET | ORAL | 3 refills | Status: AC

## 2024-10-31 MED ORDER — LISINOPRIL 10 MG PO TABS: 10.0000 mg | ORAL_TABLET | Freq: Every day | ORAL | 3 refills | Status: AC

## 2024-10-31 NOTE — Assessment & Plan Note (Addendum)
 Known history of prediabetes with A1c within goal with lifestyle modifications.  Continue healthy diet and lifestyle modifications. Check A1c. Orders:   Hemoglobin A1c

## 2024-10-31 NOTE — Assessment & Plan Note (Signed)
 MRI brain from 09/16/23 reviewed, result states:  1. No evidence of an acute intracranial abnormality. 2. There are a few small foci of T2 FLAIR hyperintense signal abnormality scattered within the cerebral white matter, new from the prior brain MRI of 11/11/2008. These signal changes are nonspecific, but most often secondary to chronic small vessel ischemia. 3. 14 mm right maxillary sinus mucous retention cyst. -- Patient was recommended to undergo echo, carotid dopplers by previous PCP after MRI however this was never done. I recommended repeat echocardiogram specially given systolic murmur, falls but patient declined this recommendation today.  Continue risk factor modifications including BP control, cholesterol control, healthy diet.

## 2024-10-31 NOTE — Progress Notes (Signed)
 "  Established Patient Office Visit TOC from Dr. Maribeth    Subjective  Patient ID: William Chase, male    DOB: 12-14-1965  Age: 59 y.o. MRN: [id number]  Chief Complaint  Patient presents with   Establish Care   Medication Refill    Discussed the use of AI scribe software for clinical note transcription with the patient, who gave verbal consent to proceed.  History of Present Illness William Chase is a 59 year old male who presents for transfer of care from previous PCP and chronic medication management.   He has been experiencing right shoulder pain following rotator cuff repair.  He is going through physical therapy. He is currently not taking ibuprofen  for the shoulder pain and reports Tylenol  doesn't touch it.  He mentions tripping over various objects, including a dog and in the shower, but does not recall the exact dates of these incidents.  Denies dizziness, lightheadedness.   He reports numbness and tingling in both feet for the past one to two months.  He notes a history of traumatic brain injury at age 89 or 64. He also mentions wearing different shoes recently, which he suspects might be related to the symptoms.  He has a history of mood disorder and follows up with Dr. Tasia (psychiatrist).    He also takes lisinopril -hydrochlorothiazide  10-12.5 mg daily, but he does not monitor his blood pressure at home.   He has a history of a heart murmur since childhood, which had resolved but has since returned. He has not seen a cardiologist recently and does not recall the last echocardiogram. He also reports a history of prediabetes, with stable A1c levels, and a history of low B12 levels for which he previously received injections.  He reports a spot in his right eye that moves with his vision. He last had an eye exam a year ago. He has a history of a minor stroke, which affected his left eye, requiring surgical correction by a pediatric ophthalmologist. No recent vision  changes.    ROS As per HPI    Objective:     BP 110/70 (BP Location: Right Arm, Cuff Size: Small)   Pulse 98   Temp 97.8 F (36.6 C) (Oral)   Ht 5' 8 (1.727 m)   Wt 173 lb 12.8 oz (78.8 kg)   SpO2 95%   BMI 26.43 kg/m      10/31/2024   11:10 AM 05/29/2024    1:08 PM 02/21/2024    3:20 PM  Depression screen PHQ 2/9  Decreased Interest 2 3 0  Down, Depressed, Hopeless 2 2 3   PHQ - 2 Score 4 5 3   Altered sleeping 0 0 3  Tired, decreased energy 1 1 1   Change in appetite 1 0 0  Feeling bad or failure about yourself  1 0 0  Trouble concentrating 1 3 3   Moving slowly or fidgety/restless 0 0 0  Suicidal thoughts 0 0 0  PHQ-9 Score 8 9  10    Difficult doing work/chores Very difficult Very difficult Somewhat difficult     Data saved with a previous flowsheet row definition      10/31/2024   11:09 AM 05/29/2024    1:09 PM 09/13/2023   11:25 AM 06/20/2023    1:02 PM  GAD 7 : Generalized Anxiety Score  Nervous, Anxious, on Edge 2 3 3 2   Control/stop worrying 3 3 3 2   Worry too much - different things 3 3 3  2  Trouble relaxing 2 3 3 2   Restless 1 1 2 1   Easily annoyed or irritable 2 2 3 2   Afraid - awful might happen 1 3 3 2   Total GAD 7 Score 14 18 20 13   Anxiety Difficulty Somewhat difficult Very difficult Very difficult Somewhat difficult      10/31/2024   11:10 AM 05/29/2024    1:08 PM 02/21/2024    3:20 PM  Depression screen PHQ 2/9  Decreased Interest 2 3 0  Down, Depressed, Hopeless 2 2 3   PHQ - 2 Score 4 5 3   Altered sleeping 0 0 3  Tired, decreased energy 1 1 1   Change in appetite 1 0 0  Feeling bad or failure about yourself  1 0 0  Trouble concentrating 1 3 3   Moving slowly or fidgety/restless 0 0 0  Suicidal thoughts 0 0 0  PHQ-9 Score 8 9  10    Difficult doing work/chores Very difficult Very difficult Somewhat difficult     Data saved with a previous flowsheet row definition      10/31/2024   11:09 AM 05/29/2024    1:09 PM 09/13/2023   11:25 AM 06/20/2023     1:02 PM  GAD 7 : Generalized Anxiety Score  Nervous, Anxious, on Edge 2 3 3 2   Control/stop worrying 3 3 3 2   Worry too much - different things 3 3 3 2   Trouble relaxing 2 3 3 2   Restless 1 1 2 1   Easily annoyed or irritable 2 2 3 2   Afraid - awful might happen 1 3 3 2   Total GAD 7 Score 14 18 20 13   Anxiety Difficulty Somewhat difficult Very difficult Very difficult Somewhat difficult   SDOH Screenings   Food Insecurity: No Food Insecurity (08/15/2024)   Received from [facility]  Housing: Low Risk  (08/15/2024)   Received from [facility]  Transportation Needs: No Transportation Needs (08/15/2024)   Received from [facility]  Utilities: Not At Risk (08/15/2024)   Received from [facility]  Alcohol Screen: Low Risk (02/21/2024)  Depression (PHQ2-9): Medium Risk (10/31/2024)  Financial Resource Strain: Low Risk  (08/15/2024)   Received from [facility]  Physical Activity: Inactive (03/27/2024)   Received from [facility]  Social Connections: Socially Isolated (03/27/2024)   Received from [facility]  Stress: No Stress Concern Present (03/27/2024)   Received from [facility]  Recent Concern: Stress - Stress Concern Present (02/21/2024)  Tobacco Use: Medium Risk (10/31/2024)  Health Literacy: Inadequate Health Literacy (03/27/2024)   Received from [facility]     Physical Exam Constitutional:      General: He is not in acute distress.    Appearance: He is not toxic-appearing.  HENT:     Head: Normocephalic and atraumatic.     Right Ear: Tympanic membrane normal. There is no impacted cerumen.     Left Ear: Tympanic membrane normal. There is no impacted cerumen.     Mouth/Throat:     Mouth: Mucous membranes are moist.  Eyes:     Pupils: Pupils are equal, round, and reactive to light.  Neck:     Thyroid : No thyroid  mass  or thyroid  tenderness.  Cardiovascular:     Rate and Rhythm: Normal rate and regular rhythm.     Heart sounds: Murmur (grade III systolic murmur prominent on left II ICS) heard.  Pulmonary:  Effort: Pulmonary effort is normal.     Breath sounds: Normal breath sounds. No wheezing.  Abdominal:     General: Bowel sounds are normal. There is no distension.     Palpations: Abdomen is soft.     Tenderness: There is no abdominal tenderness. There is no guarding.  Musculoskeletal:     Cervical back: Neck supple. No rigidity.     Right lower leg: No edema.     Left lower leg: No edema.  Skin:    General: Skin is warm.     Comments: Left anterior shin: Healed post-traumatic scar, skin smooth, shiny, hypopigmented with absence of hair, no erythema, ulceration.   Neurological:     Mental Status: He is alert and oriented to person, place, and time.     Gait: Gait normal.  Psychiatric:        Mood and Affect: Mood normal. Affect is flat.        Behavior: Behavior is not agitated. Behavior is cooperative.        Thought Content: Thought content does not include homicidal or suicidal ideation. Thought content does not include homicidal or suicidal plan.        No results found for any visits on 10/31/24.  The 10-year ASCVD risk score (Arnett DK, et al., 2019) is: 4.3%     Assessment & Plan:  Patient describes of floater like spot on right visual field, recommend her schedules appointment with ophthalmologist for further evaluation.   Patient is also listed to have dizziness suspected to doxycycline  (not true allergy) and he is not sure if dizziness was related to antibiotic doxycycline .  Assessment & Plan Primary hypertension BP on arrival on low normal side. Currently taking lisinopril -hydrochlorothiazide  10-12.5 mg daily.  Risk of dizziness and falls from hypotension discussed.  Discontinued hydrochlorothiazide . Continue lisinopril  10 mg daily. Refill sent.  Follow up in 6 months  to reassess blood pressure and medication regimen. Orders:   Comprehensive metabolic panel with GFR   lisinopril  (ZESTRIL ) 10 MG tablet; Take 1 tablet (10 mg total) by mouth daily.  Prediabetes Known history of prediabetes with A1c within goal with lifestyle modifications.  Continue healthy diet and lifestyle modifications. Check A1c. Orders:   Hemoglobin A1c  Dyslipidemia Given h/o abnormal MRI recommend continue Simvastatin  40 mg daily for ASCVD prevention. Check CMP.  Orders:   simvastatin  (ZOCOR ) 40 MG tablet; TAKE 1 TABLET BY MOUTH EVERYDAY AT BEDTIME   Comprehensive metabolic panel with GFR  Low serum vitamin B12 Known h/o with previous treatment with IM vitamin b12 injection. Oral B12 more convenient. Will check B12 today, management pending result.  Orders:   B12  Status post right rotator cuff repair With ongoing physical therapy and pain. Recommend follow up with orthopedic surgeon Dr. Edie for evaluation and further management.     Systolic murmur Strongly recommend repeating echocardiogram to follow up on murmur, structural abnormality of heart. Patient kindly declined.      Tingling in extremities B/L feet. Starting after changing footwear,  possibly related to footwear. Symptoms constant, not painful.Wear protective shoes. Consider neurology referral if symptoms persist or worsen.    Drug-induced constipation History of drug induced constipation, previously was on opoid for pain control needing Lubiprostone  24 mcg BID. Since he is not on opioids he is no longer taking this and does not need it at this time.     Mood disorder H/O bipolar, anxiety, depression traumatic brain injury when he was young.  Current medications includes  diazepam 10 mg (prn), Ritalin BID, Quetiapine at bedtime, trazodone bedtime, bupropion. Reviewed PHQ-9, GAD-7, no SI/HI. These medications managed by psychiatrist Dr. Elna Lo. Continue close follow up with him.       Falls Recommend repeat echocardiogram, patient kindly declined as 3 falls over the last year was mechanical in nature. Recommend re-evaluation if dizziness, weakness, presyncopal or fall. Stop lisinopril -hydrochlorothiazide  10-12.5 mg, start Lisinopril  10 mg daily.     Abnormal brain MRI MRI brain from 09/16/23 reviewed, result states:  1. No evidence of an acute intracranial abnormality. 2. There are a few small foci of T2 FLAIR hyperintense signal abnormality scattered within the cerebral white matter, new from the prior brain MRI of 11/11/2008. These signal changes are nonspecific, but most often secondary to chronic small vessel ischemia. 3. 14 mm right maxillary sinus mucous retention cyst. -- Patient was recommended to undergo echo, carotid dopplers by previous PCP after MRI however this was never done. I recommended repeat echocardiogram specially given systolic murmur, falls but patient declined this recommendation today.  Continue risk factor modifications including BP control, cholesterol control, healthy diet.     I personally spent a total of 45 minutes in the care of the patient today including preparing to see the patient, getting/reviewing separately obtained history, counseling and educating, placing orders, documenting clinical information in the EHR, independently interpreting results, and communicating results.  Return in about 6 months (around 04/30/2025) for Chronic follow up .   Luke Shade, MD "

## 2024-10-31 NOTE — Assessment & Plan Note (Addendum)
 Recommend repeat echocardiogram, patient kindly declined as 3 falls over the last year was mechanical in nature. Recommend re-evaluation if dizziness, weakness, presyncopal or fall. Stop lisinopril -hydrochlorothiazide  10-12.5 mg, start Lisinopril  10 mg daily.

## 2024-10-31 NOTE — Assessment & Plan Note (Addendum)
 Known h/o with previous treatment with IM vitamin b12 injection. Oral B12 more convenient. Will check B12 today, management pending result.  Orders:   B12

## 2024-10-31 NOTE — Assessment & Plan Note (Addendum)
 Strongly recommend repeating echocardiogram to follow up on murmur, structural abnormality of heart. Patient kindly declined.

## 2024-10-31 NOTE — Assessment & Plan Note (Addendum)
 History of drug induced constipation, previously was on opoid for pain control needing Lubiprostone  24 mcg BID. Since he is not on opioids he is no longer taking this and does not need it at this time.

## 2024-10-31 NOTE — Assessment & Plan Note (Addendum)
 Given h/o abnormal MRI recommend continue Simvastatin  40 mg daily for ASCVD prevention. Check CMP.  Orders:   simvastatin  (ZOCOR ) 40 MG tablet; TAKE 1 TABLET BY MOUTH EVERYDAY AT BEDTIME   Comprehensive metabolic panel with GFR

## 2024-10-31 NOTE — Assessment & Plan Note (Addendum)
 B/L feet. Starting after changing footwear,  possibly related to footwear. Symptoms constant, not painful.Wear protective shoes. Consider neurology referral if symptoms persist or worsen.

## 2024-10-31 NOTE — Assessment & Plan Note (Addendum)
 BP on arrival on low normal side. Currently taking lisinopril -hydrochlorothiazide  10-12.5 mg daily.  Risk of dizziness and falls from hypotension discussed.  Discontinued hydrochlorothiazide . Continue lisinopril  10 mg daily. Refill sent.  Follow up in 6 months to reassess blood pressure and medication regimen. Orders:   Comprehensive metabolic panel with GFR   lisinopril  (ZESTRIL ) 10 MG tablet; Take 1 tablet (10 mg total) by mouth daily.

## 2024-10-31 NOTE — Assessment & Plan Note (Addendum)
 H/O bipolar, anxiety, depression traumatic brain injury when he was young.  Current medications includes diazepam 10 mg (prn), Ritalin BID, Quetiapine at bedtime, trazodone bedtime, bupropion. Reviewed PHQ-9, GAD-7, no SI/HI. These medications managed by psychiatrist Dr. Elna Lo. Continue close follow up with him.

## 2024-10-31 NOTE — Assessment & Plan Note (Addendum)
 With ongoing physical therapy and pain. Recommend follow up with orthopedic surgeon Dr. Edie for evaluation and further management.

## 2024-10-31 NOTE — Patient Instructions (Addendum)
 BP goal is <130/80. Stop lisinopril -hydrochlorothiazide . Start Lisinopril  10 mg daily. I have sent the prescription to the pharmacy.   I recommend follow up with eye doctor for the right eye floater concern.

## 2024-11-01 ENCOUNTER — Ambulatory Visit: Payer: Self-pay

## 2024-11-01 DIAGNOSIS — E875 Hyperkalemia: Secondary | ICD-10-CM | POA: Insufficient documentation

## 2024-11-01 DIAGNOSIS — E538 Deficiency of other specified B group vitamins: Secondary | ICD-10-CM

## 2024-11-01 MED ORDER — VITAMIN B-12 1000 MCG PO TABS: 1000.0000 ug | ORAL_TABLET | Freq: Every day | ORAL | 3 refills | Status: AC

## 2024-11-01 NOTE — Telephone Encounter (Signed)
 Tried calling the patient, he did not pick up the call at 10:03 AM.   Luke Shade, MD

## 2024-11-01 NOTE — Progress Notes (Signed)
 Please call the patient (last mychart login 09/02/24) to let him know his A1c shows stable blood glucose. Vitamin B12 is on low normal side. Recommend starting oral vitamin B12:1000 mcg daily, prescription sent to the pharmacy.   Kidney function shows stable kidney, liver function. Mildly elevated serum potassium but the blood sample also showed hemolysis, which means some blood cells broke during the blood draw. This can falsely raise the potassium result. To be sure everything is normal, I recommend repeating the potassium test in 1 week. Future lab ordered.  If he has more questions or concerns please offer him office visit.   Thank you,  Luke Shade, MD  --- For documentation: 1. Low serum vitamin B12 (Primary) - cyanocobalamin  (VITAMIN B12) 1000 MCG tablet; Take 1 tablet (1,000 mcg total) by mouth daily.  Dispense: 90 tablet; Refill: 3  2. Hyperkalemia - Potassium; Future   Luke Shade, MD

## 2024-11-04 NOTE — Progress Notes (Signed)
 Talked to the patient on the phone, updated him on recent results pertaining hyperkalemia. Recommend he calls out office to schedule lab only appointment to ensure potassium is normalized. Verbalizes understanding, future lab is already ordered.   William Shade, MD

## 2024-11-06 ENCOUNTER — Other Ambulatory Visit (INDEPENDENT_AMBULATORY_CARE_PROVIDER_SITE_OTHER)

## 2024-11-06 DIAGNOSIS — E875 Hyperkalemia: Secondary | ICD-10-CM

## 2024-11-07 ENCOUNTER — Ambulatory Visit: Payer: Self-pay

## 2024-11-07 LAB — POTASSIUM: Potassium: 4.1 meq/L (ref 3.5–5.1)

## 2024-11-07 NOTE — Progress Notes (Signed)
 Updated patient on normal serum potassium level. Recommend daily B12 1000 mcg which he got over-the-counter. Also recommend trying Voltaren  gel up to 4 times a day on bilateral feet for cramping of bilateral toes.  Recommend patient reach out for follow-up appointment if crampings does not improve with change in shoes and above measures.  Consider checking iron panel, potential referral to podiatry or neurologist if symptoms persist.    Patient verbalizes understanding and agrees with plan.  Luke Shade, MD

## 2025-02-25 ENCOUNTER — Ambulatory Visit

## 2025-04-30 ENCOUNTER — Ambulatory Visit
